# Patient Record
Sex: Female | Born: 1945 | Race: Black or African American | Hispanic: No | Smoking: Former smoker
Health system: Southern US, Community
[De-identification: ages and names within clinical notes are randomized; demographics above are authoritative.]

## PROBLEM LIST (undated history)

## (undated) ENCOUNTER — Emergency Department (HOSPITAL_COMMUNITY): Admission: EM | Discharge status: Absent without leave | Payer: Medicare HMO

## (undated) DIAGNOSIS — C50919 Malignant neoplasm of unspecified site of unspecified female breast: Secondary | ICD-10-CM

## (undated) DIAGNOSIS — E785 Hyperlipidemia, unspecified: Secondary | ICD-10-CM

## (undated) DIAGNOSIS — I10 Essential (primary) hypertension: Secondary | ICD-10-CM

## (undated) DIAGNOSIS — M1990 Unspecified osteoarthritis, unspecified site: Secondary | ICD-10-CM

## (undated) DIAGNOSIS — T7840XA Allergy, unspecified, initial encounter: Secondary | ICD-10-CM

## (undated) DIAGNOSIS — J45909 Unspecified asthma, uncomplicated: Secondary | ICD-10-CM

## (undated) DIAGNOSIS — I714 Abdominal aortic aneurysm, without rupture, unspecified: Secondary | ICD-10-CM

## (undated) DIAGNOSIS — H269 Unspecified cataract: Secondary | ICD-10-CM

## (undated) DIAGNOSIS — G43909 Migraine, unspecified, not intractable, without status migrainosus: Secondary | ICD-10-CM

## (undated) DIAGNOSIS — C801 Malignant (primary) neoplasm, unspecified: Secondary | ICD-10-CM

## (undated) DIAGNOSIS — R011 Cardiac murmur, unspecified: Secondary | ICD-10-CM

## (undated) DIAGNOSIS — K219 Gastro-esophageal reflux disease without esophagitis: Secondary | ICD-10-CM

## (undated) HISTORY — DX: Abdominal aortic aneurysm, without rupture: I71.4

## (undated) HISTORY — PX: DILATION AND CURETTAGE OF UTERUS: SHX78

## (undated) HISTORY — PX: MASTECTOMY: SHX3

## (undated) HISTORY — DX: Allergy, unspecified, initial encounter: T78.40XA

## (undated) HISTORY — PX: POLYPECTOMY: SHX149

## (undated) HISTORY — DX: Abdominal aortic aneurysm, without rupture, unspecified: I71.40

## (undated) HISTORY — DX: Migraine, unspecified, not intractable, without status migrainosus: G43.909

## (undated) HISTORY — DX: Gastro-esophageal reflux disease without esophagitis: K21.9

## (undated) HISTORY — PX: BREAST SURGERY: SHX581

## (undated) HISTORY — DX: Unspecified osteoarthritis, unspecified site: M19.90

## (undated) HISTORY — PX: BREAST LUMPECTOMY: SHX2

## (undated) HISTORY — PX: OTHER SURGICAL HISTORY: SHX169

## (undated) HISTORY — PX: COLONOSCOPY: SHX174

## (undated) HISTORY — DX: Unspecified cataract: H26.9

## (undated) HISTORY — PX: TONSILLECTOMY: SUR1361

## (undated) HISTORY — DX: Hyperlipidemia, unspecified: E78.5

## (undated) HISTORY — DX: Malignant (primary) neoplasm, unspecified: C80.1

## (undated) HISTORY — DX: Malignant neoplasm of unspecified site of unspecified female breast: C50.919

## (undated) HISTORY — PX: TUBAL LIGATION: SHX77

## (undated) HISTORY — DX: Cardiac murmur, unspecified: R01.1

---

## 1976-02-16 DIAGNOSIS — I10 Essential (primary) hypertension: Secondary | ICD-10-CM

## 1976-02-16 HISTORY — DX: Essential (primary) hypertension: I10

## 1983-02-16 HISTORY — PX: CHOLECYSTECTOMY: SHX55

## 1987-02-16 HISTORY — PX: ABDOMINAL HYSTERECTOMY: SHX81

## 2001-02-15 DIAGNOSIS — K219 Gastro-esophageal reflux disease without esophagitis: Secondary | ICD-10-CM

## 2001-02-15 DIAGNOSIS — J45909 Unspecified asthma, uncomplicated: Secondary | ICD-10-CM

## 2001-02-15 HISTORY — DX: Gastro-esophageal reflux disease without esophagitis: K21.9

## 2001-02-15 HISTORY — DX: Unspecified asthma, uncomplicated: J45.909

## 2004-02-16 DIAGNOSIS — C801 Malignant (primary) neoplasm, unspecified: Secondary | ICD-10-CM

## 2004-02-16 HISTORY — DX: Malignant (primary) neoplasm, unspecified: C80.1

## 2005-02-15 HISTORY — PX: ROTATOR CUFF REPAIR: SHX139

## 2006-02-15 DIAGNOSIS — G43909 Migraine, unspecified, not intractable, without status migrainosus: Secondary | ICD-10-CM

## 2006-02-15 HISTORY — DX: Migraine, unspecified, not intractable, without status migrainosus: G43.909

## 2012-06-16 ENCOUNTER — Emergency Department (HOSPITAL_COMMUNITY)
Admission: EM | Admit: 2012-06-16 | Discharge: 2012-06-16 | Disposition: A | Discharge status: Home / Self Care | Payer: Federal, State, Local not specified - PPO | Source: Home / Self Care | Attending: Family Medicine | Admitting: Family Medicine

## 2012-06-16 ENCOUNTER — Encounter (HOSPITAL_COMMUNITY): Payer: Self-pay | Admitting: Emergency Medicine

## 2012-06-16 ENCOUNTER — Emergency Department (INDEPENDENT_AMBULATORY_CARE_PROVIDER_SITE_OTHER): Payer: Federal, State, Local not specified - PPO

## 2012-06-16 DIAGNOSIS — K219 Gastro-esophageal reflux disease without esophagitis: Secondary | ICD-10-CM

## 2012-06-16 DIAGNOSIS — J302 Other seasonal allergic rhinitis: Secondary | ICD-10-CM

## 2012-06-16 DIAGNOSIS — IMO0001 Reserved for inherently not codable concepts without codable children: Secondary | ICD-10-CM

## 2012-06-16 DIAGNOSIS — J309 Allergic rhinitis, unspecified: Secondary | ICD-10-CM

## 2012-06-16 HISTORY — DX: Essential (primary) hypertension: I10

## 2012-06-16 HISTORY — DX: Unspecified asthma, uncomplicated: J45.909

## 2012-06-16 MED ORDER — OMEPRAZOLE 20 MG PO CPDR: 20.0000 mg | DELAYED_RELEASE_CAPSULE | Freq: Every day | ORAL | Status: DC

## 2012-06-16 MED ORDER — MOMETASONE FUROATE 50 MCG/ACT NA SUSP: 2.0000 | Freq: Two times a day (BID) | NASAL | Status: DC

## 2012-06-16 MED ORDER — HYDROCOD POLST-CHLORPHEN POLST 10-8 MG/5ML PO LQCR: 5.0000 mL | Freq: Every evening | ORAL | Status: DC | PRN

## 2012-06-16 NOTE — ED Provider Notes (Signed)
History     CSN: [id number]  Arrival date & time 06/16/12  1240   First MD Initiated Contact with Patient 06/16/12 1322      Chief Complaint  Patient presents with  . Sore Throat     Patient is a 67 y.o. female presenting with pharyngitis. The history is provided by the patient.  Sore Throat This is a new problem. The current episode started more than 2 days ago. The problem occurs constantly. The problem has not changed since onset.Pertinent negatives include no chest pain, no abdominal pain and no shortness of breath. The symptoms are aggravated by coughing. Nothing relieves the symptoms.  Pt is a somewhat vague, inarticulate historian that reports 4-5 days of severe throat irritation. States it feels like her throat has something stuck in it. Approx 7 days ago pt noted increased PND that she would feel running down her throat at night that often caused her to cough. The cough is a very dry cough and often makes her gag. She states since onset of symptoms she has had increased sensation that something is stuck in her throat. Coughing is worse when lying down and after eating. Denies other URI type symptoms though has had a persistent low grade fever (T-Max 99). States the cough is often so bad at night that she can't sleep.   Past Medical History  Diagnosis Date  . Hypertension   . Asthma     Past Surgical History  Procedure Laterality Date  . Abdominal hysterectomy    . Cholecystectomy    . Breast surgery    . Rotator cuff repair      No family history on file.  History  Substance Use Topics  . Smoking status: Never Smoker   . Smokeless tobacco: Not on file  . Alcohol Use: No    OB History   Grav Para Term Preterm Abortions TAB SAB Ect Mult Living                  Review of Systems  Constitutional: Negative for fever and chills.  HENT: Positive for sore throat and postnasal drip. Negative for ear pain, congestion, facial swelling, rhinorrhea, sneezing and trouble  swallowing.   Eyes: Negative.   Respiratory: Positive for cough. Negative for chest tightness, shortness of breath and wheezing.   Cardiovascular: Negative for chest pain.  Gastrointestinal: Negative for nausea, vomiting, abdominal pain and diarrhea.  Endocrine: Negative.   Genitourinary: Negative.   Musculoskeletal: Negative.   Skin: Negative.   Allergic/Immunologic: Negative.   Neurological: Negative.   Hematological: Negative.   Psychiatric/Behavioral: Negative.     Allergies  Ivp dye and Sulfa antibiotics  Home Medications   Current Outpatient Rx  Name  Route  Sig  Dispense  Refill  . LISINOPRIL PO   Oral   Take by mouth.         Marland Kitchen POTASSIUM PO   Oral   Take by mouth.           BP 125/80  Pulse 72  Temp(Src) 99.6 F (37.6 C) (Oral)  Resp 20  SpO2 98%  Physical Exam  Constitutional: She is oriented to person, place, and time. She appears well-developed and well-nourished.  HENT:  Head: Normocephalic and atraumatic.  Right Ear: Tympanic membrane, external ear and ear canal normal.  Left Ear: Tympanic membrane, external ear and ear canal normal.  Nose: Nose normal. Right sinus exhibits no maxillary sinus tenderness and no frontal sinus tenderness. Left sinus exhibits no  maxillary sinus tenderness and no frontal sinus tenderness.  Mouth/Throat: Uvula is midline, oropharynx is clear and moist and mucous membranes are normal.  Cobblestoning to posterior oropharynx  Eyes: Conjunctivae are normal.  Neck: Neck supple.  Cardiovascular: Normal rate and regular rhythm.   Pulmonary/Chest: Effort normal and breath sounds normal.  Musculoskeletal: Normal range of motion.  Lymphadenopathy:    She has no cervical adenopathy.  Neurological: She is alert and oriented to person, place, and time.  Skin: Skin is warm and dry.  Psychiatric: She has a normal mood and affect.    ED Course  Procedures (including critical care time)  Labs Reviewed  POCT RAPID STREP A ([facility]  URG CARE ONLY)   No results found.   No diagnosis found.    MDM  4-5 day h/o something stuck in her throat. Persistent dry cough that is worse when lying down and after eating. Soft tissue neck imaging normal. Suspect possible combination of refux symptoms possibly exacerbated by seasonal allergy symptoms. Will         Leanne Chang, NP 06/16/12 [phone]

## 2012-06-16 NOTE — ED Provider Notes (Signed)
Medical screening examination/treatment/procedure(s) were performed by non-physician practitioner and as supervising physician I was immediately available for consultation/collaboration.   MORENO-COLL,Mieka Leaton; MD  Diezel Mazur Moreno-Coll, MD 06/16/12 1515 

## 2012-06-16 NOTE — ED Notes (Signed)
Pt c/o sore throat onset 1 week Sx also include: dry cough and feeling nauseas, odynophagia  Pain in throat feels like she's "choking and it's closing up on her" Taking OTC cough meds w/little relief Denies: f/v/d  She is alert and oriented w/no signs of acute distress.

## 2012-07-29 ENCOUNTER — Emergency Department (INDEPENDENT_AMBULATORY_CARE_PROVIDER_SITE_OTHER)
Admission: EM | Admit: 2012-07-29 | Discharge: 2012-07-29 | Disposition: A | Discharge status: Home / Self Care | Payer: Federal, State, Local not specified - PPO | Source: Home / Self Care | Attending: Emergency Medicine | Admitting: Emergency Medicine

## 2012-07-29 ENCOUNTER — Encounter (HOSPITAL_COMMUNITY): Payer: Self-pay | Admitting: *Deleted

## 2012-07-29 ENCOUNTER — Emergency Department (INDEPENDENT_AMBULATORY_CARE_PROVIDER_SITE_OTHER): Payer: Federal, State, Local not specified - PPO

## 2012-07-29 DIAGNOSIS — S5290XA Unspecified fracture of unspecified forearm, initial encounter for closed fracture: Secondary | ICD-10-CM

## 2012-07-29 DIAGNOSIS — S5292XA Unspecified fracture of left forearm, initial encounter for closed fracture: Secondary | ICD-10-CM

## 2012-07-29 MED ORDER — ONDANSETRON HCL 4 MG/2ML IJ SOLN
INTRAMUSCULAR | Status: AC
  Filled 2012-07-29: qty 2

## 2012-07-29 MED ORDER — HYDROMORPHONE HCL PF 1 MG/ML IJ SOLN
INTRAMUSCULAR | Status: AC
  Filled 2012-07-29: qty 1

## 2012-07-29 MED ORDER — HYDROMORPHONE HCL PF 1 MG/ML IJ SOLN
1.0000 mg | Freq: Once | INTRAMUSCULAR | Status: AC
  Administered 2012-07-29: 1 mg via INTRAMUSCULAR

## 2012-07-29 MED ORDER — ONDANSETRON HCL 4 MG/2ML IJ SOLN
4.0000 mg | Freq: Once | INTRAMUSCULAR | Status: AC
  Administered 2012-07-29: 4 mg via INTRAMUSCULAR

## 2012-07-29 MED ORDER — OXYCODONE-ACETAMINOPHEN 5-325 MG PO TABS: ORAL_TABLET | ORAL | Status: DC

## 2012-07-29 MED ORDER — ONDANSETRON 8 MG PO TBDP: 8.0000 mg | ORAL_TABLET | Freq: Three times a day (TID) | ORAL | Status: DC | PRN

## 2012-07-29 NOTE — ED Provider Notes (Signed)
Chief Complaint:   Chief Complaint  Patient presents with  . Fall     History of Present Illness:   Kathryn [name] is a 67 year old female who fell today on her outstretched left hand, injuring her left wrist. The wrist is swollen and painful. It hurts to move it. She is able to move her fingers and denies any numbness or tingling. She has mild pain in her elbow and shoulder but is able to move well. She denies any head injury or loss of consciousness.  Review of Systems:  Other than noted above, the patient denies any of the following symptoms: Systemic:  No fevers, chills, sweats, or aches.  No fatigue or tiredness. Musculoskeletal:  No joint pain, arthritis, bursitis, swelling, back pain, or neck pain. Neurological:  No muscular weakness, paresthesias, headache, or trouble with speech or coordination.  No dizziness.  PMFSH:  Past medical history, family history, social history, meds, and allergies were reviewed.  She is allergic to contrast dye and sulfa. She takes lisinopril, Nasonex, Prilosec, and potassium. She has hypertension, diet-controlled diabetes, arthritis, COPD, and history of breast cancer.  Physical Exam:   Vital signs:  BP 168/116  Pulse 87  Temp(Src) 99.1 F (37.3 C) (Oral)  Resp 18  SpO2 100% Gen:  Alert and oriented times 3.  In no distress. Musculoskeletal: There was swelling and pain to palpation over the the distal radius. The wrist has a decreased range of motion with pain. She's able to move all her digits well and sensation is intact. Radial pulse was full..  Otherwise, all joints had a full a ROM with no swelling, bruising or deformity.  No edema, pulses full. Extremities were warm and pink.  Capillary refill was brisk.  Skin:  Clear, warm and dry.  No rash. Neuro:  Alert and oriented times 3.  Muscle strength was normal.  Sensation was intact to light touch.   Radiology:  Dg Wrist Complete Left  07/29/2012   *RADIOLOGY REPORT*  Clinical Data: Fall, left wrist  pain  LEFT WRIST - COMPLETE 3+ VIEW  Comparison: None.  Findings: Two views of the left wrist submitted.  Narrowing of radiocarpal joint space.  Degenerative changes first carpal metacarpal joint.  There is a lucent line in distal left radius highly suspicious for nondisplaced fracture.  IMPRESSION: Probable nondisplaced fracture in distal left radius.  Narrowing of radiocarpal joint space.   Original Report Authenticated By: Natasha Mead, M.D.     I reviewed the images independently and personally and concur with the radiologist's findings.  Course in Urgent Care Center:   She was in a good yield pain upon arrival and so was given Dilaudid 1 mg IM and Zofran 4 mg IM. A sugar tong splint was applied and she was given a sling.  Assessment:  The encounter diagnosis was Radius fracture, left, closed, initial encounter.  This is nondisplaced, should heal up well.  Plan:   1.  The following meds were prescribed:   Discharge Medication List as of 07/29/2012  4:23 PM    START taking these medications   Details  ondansetron (ZOFRAN ODT) 8 MG disintegrating tablet Take 1 tablet (8 mg total) by mouth every 8 (eight) hours as needed for nausea., Starting 07/29/2012, Until Discontinued, Normal    oxyCODONE-acetaminophen (PERCOCET) 5-325 MG per tablet 1 to 2 tablets every 6 hours as needed for pain., Print       2.  The patient was instructed in symptomatic care, including rest and activity,  elevation, application of ice and compression.  Appropriate handouts were given. 3.  The patient was told to return if becoming worse in any way, if no better in 3 or 4 days, and given some red flag symptoms such as worsening pain or new neurological symptoms that would indicate earlier return.   4.  The patient was told to follow up with Dr. Bradly Bienenstock next week.    Reuben Likes, MD 07/29/12 [phone]

## 2012-07-29 NOTE — ED Notes (Signed)
Pt  Reports  She  Felled  Today    Injured  Her     l  Wrist  And  l  Shoulder   She     Did  Not  Black  Out         The  Wrist  Is  Deformed                No  Sticks     BP  r  Arm  S/p  Mastectomy

## 2012-07-29 NOTE — Progress Notes (Signed)
Orthopedic Tech Progress Note Patient Details:  Kathryn [name] [birth year] [id number]  Ortho Devices Type of Ortho Device: Ace wrap;Arm sling;Sugartong splint Ortho Device/Splint Location: LUE Ortho Device/Splint Interventions: Ordered;Application   Jennye Moccasin 07/29/2012, 4:38 PM

## 2012-11-24 ENCOUNTER — Emergency Department (HOSPITAL_COMMUNITY): Payer: Medicare Other

## 2012-11-24 ENCOUNTER — Inpatient Hospital Stay (HOSPITAL_COMMUNITY)
Admission: EM | Admit: 2012-11-24 | Discharge: 2012-11-27 | DRG: 808 | Disposition: A | Discharge status: Home / Self Care | Payer: Medicare Other | Attending: Internal Medicine | Admitting: Internal Medicine

## 2012-11-24 ENCOUNTER — Encounter (HOSPITAL_COMMUNITY): Payer: Self-pay | Admitting: Emergency Medicine

## 2012-11-24 DIAGNOSIS — J449 Chronic obstructive pulmonary disease, unspecified: Secondary | ICD-10-CM | POA: Diagnosis present

## 2012-11-24 DIAGNOSIS — Z87891 Personal history of nicotine dependence: Secondary | ICD-10-CM

## 2012-11-24 DIAGNOSIS — D709 Neutropenia, unspecified: Secondary | ICD-10-CM | POA: Diagnosis present

## 2012-11-24 DIAGNOSIS — R5081 Fever presenting with conditions classified elsewhere: Secondary | ICD-10-CM | POA: Diagnosis present

## 2012-11-24 DIAGNOSIS — F32A Depression, unspecified: Secondary | ICD-10-CM | POA: Diagnosis present

## 2012-11-24 DIAGNOSIS — J3489 Other specified disorders of nose and nasal sinuses: Secondary | ICD-10-CM | POA: Diagnosis present

## 2012-11-24 DIAGNOSIS — B9789 Other viral agents as the cause of diseases classified elsewhere: Secondary | ICD-10-CM | POA: Diagnosis present

## 2012-11-24 DIAGNOSIS — K5900 Constipation, unspecified: Secondary | ICD-10-CM

## 2012-11-24 DIAGNOSIS — E876 Hypokalemia: Secondary | ICD-10-CM | POA: Diagnosis present

## 2012-11-24 DIAGNOSIS — F3289 Other specified depressive episodes: Secondary | ICD-10-CM | POA: Diagnosis present

## 2012-11-24 DIAGNOSIS — D1779 Benign lipomatous neoplasm of other sites: Secondary | ICD-10-CM | POA: Diagnosis present

## 2012-11-24 DIAGNOSIS — I714 Abdominal aortic aneurysm, without rupture, unspecified: Secondary | ICD-10-CM | POA: Diagnosis present

## 2012-11-24 DIAGNOSIS — C50919 Malignant neoplasm of unspecified site of unspecified female breast: Secondary | ICD-10-CM | POA: Diagnosis present

## 2012-11-24 DIAGNOSIS — I10 Essential (primary) hypertension: Secondary | ICD-10-CM

## 2012-11-24 DIAGNOSIS — Z853 Personal history of malignant neoplasm of breast: Secondary | ICD-10-CM | POA: Insufficient documentation

## 2012-11-24 DIAGNOSIS — F329 Major depressive disorder, single episode, unspecified: Secondary | ICD-10-CM | POA: Diagnosis present

## 2012-11-24 DIAGNOSIS — J4489 Other specified chronic obstructive pulmonary disease: Secondary | ICD-10-CM | POA: Diagnosis present

## 2012-11-24 DIAGNOSIS — J45909 Unspecified asthma, uncomplicated: Secondary | ICD-10-CM

## 2012-11-24 DIAGNOSIS — E43 Unspecified severe protein-calorie malnutrition: Secondary | ICD-10-CM | POA: Diagnosis present

## 2012-11-24 DIAGNOSIS — J069 Acute upper respiratory infection, unspecified: Secondary | ICD-10-CM | POA: Diagnosis present

## 2012-11-24 LAB — CBC WITH DIFFERENTIAL/PLATELET
Basophils Relative: 1 % (ref 0–1)
Eosinophils Absolute: 0 10*3/uL (ref 0.0–0.7)
HCT: 28 % — ABNORMAL LOW (ref 36.0–46.0)
Hemoglobin: 9.5 g/dL — ABNORMAL LOW (ref 12.0–15.0)
Lymphs Abs: 1.1 10*3/uL (ref 0.7–4.0)
MCH: 26.3 pg (ref 26.0–34.0)
MCHC: 33.9 g/dL (ref 30.0–36.0)
MCV: 77.6 fL — ABNORMAL LOW (ref 78.0–100.0)
Monocytes Absolute: 0.5 10*3/uL (ref 0.1–1.0)
Neutro Abs: 0.1 10*3/uL — ABNORMAL LOW (ref 1.7–7.7)

## 2012-11-24 LAB — COMPREHENSIVE METABOLIC PANEL
Albumin: 2.8 g/dL — ABNORMAL LOW (ref 3.5–5.2)
BUN: 7 mg/dL (ref 6–23)
Chloride: 104 mEq/L (ref 96–112)
Creatinine, Ser: 0.83 mg/dL (ref 0.50–1.10)
Total Bilirubin: 0.3 mg/dL (ref 0.3–1.2)
Total Protein: 5.9 g/dL — ABNORMAL LOW (ref 6.0–8.3)

## 2012-11-24 LAB — INFLUENZA PANEL BY PCR (TYPE A & B)
H1N1 flu by pcr: NOT DETECTED
Influenza A By PCR: NEGATIVE
Influenza B By PCR: NEGATIVE

## 2012-11-24 LAB — URINALYSIS, ROUTINE W REFLEX MICROSCOPIC
Glucose, UA: NEGATIVE mg/dL
Leukocytes, UA: NEGATIVE
Protein, ur: NEGATIVE mg/dL
Urobilinogen, UA: 0.2 mg/dL (ref 0.0–1.0)

## 2012-11-24 MED ORDER — POTASSIUM CHLORIDE CRYS ER 20 MEQ PO TBCR
40.0000 meq | EXTENDED_RELEASE_TABLET | Freq: Once | ORAL | Status: AC
  Administered 2012-11-24: 40 meq via ORAL
  Filled 2012-11-24: qty 2

## 2012-11-24 MED ORDER — POLYETHYLENE GLYCOL 3350 17 G PO PACK
17.0000 g | PACK | Freq: Every day | ORAL | Status: DC
  Administered 2012-11-24 – 2012-11-27 (×4): 17 g via ORAL
  Filled 2012-11-24 (×5): qty 1

## 2012-11-24 MED ORDER — ACETAMINOPHEN 325 MG PO TABS
650.0000 mg | ORAL_TABLET | Freq: Four times a day (QID) | ORAL | Status: DC | PRN
  Administered 2012-11-25 – 2012-11-26 (×4): 650 mg via ORAL
  Filled 2012-11-24 (×4): qty 2

## 2012-11-24 MED ORDER — POTASSIUM CHLORIDE CRYS ER 20 MEQ PO TBCR: 30.0000 meq | EXTENDED_RELEASE_TABLET | Freq: Once | ORAL | Status: DC

## 2012-11-24 MED ORDER — SODIUM CHLORIDE 0.9 % IJ SOLN: 3.0000 mL | INTRAMUSCULAR | Status: DC | PRN

## 2012-11-24 MED ORDER — ENOXAPARIN SODIUM 40 MG/0.4ML ~~LOC~~ SOLN
40.0000 mg | SUBCUTANEOUS | Status: DC
  Administered 2012-11-24 – 2012-11-26 (×3): 40 mg via SUBCUTANEOUS
  Filled 2012-11-24 (×4): qty 0.4

## 2012-11-24 MED ORDER — SODIUM CHLORIDE 0.9 % IV SOLN: 250.0000 mL | INTRAVENOUS | Status: DC | PRN

## 2012-11-24 MED ORDER — DOCUSATE SODIUM 100 MG PO CAPS
100.0000 mg | ORAL_CAPSULE | Freq: Every day | ORAL | Status: DC | PRN
  Administered 2012-11-24: 100 mg via ORAL
  Filled 2012-11-24: qty 1

## 2012-11-24 MED ORDER — DEXTROSE 5 % IV SOLN
2.0000 g | Freq: Three times a day (TID) | INTRAVENOUS | Status: DC
  Administered 2012-11-24 – 2012-11-27 (×9): 2 g via INTRAVENOUS
  Filled 2012-11-24 (×12): qty 2

## 2012-11-24 MED ORDER — ENSURE COMPLETE PO LIQD
237.0000 mL | ORAL | Status: DC
  Administered 2012-11-25 – 2012-11-26 (×2): 237 mL via ORAL

## 2012-11-24 MED ORDER — SODIUM CHLORIDE 0.9 % IJ SOLN
3.0000 mL | Freq: Two times a day (BID) | INTRAMUSCULAR | Status: DC
  Administered 2012-11-24 – 2012-11-27 (×2): 3 mL via INTRAVENOUS

## 2012-11-24 MED ORDER — ALBUTEROL SULFATE HFA 108 (90 BASE) MCG/ACT IN AERS
2.0000 | INHALATION_SPRAY | Freq: Four times a day (QID) | RESPIRATORY_TRACT | Status: DC | PRN
  Filled 2012-11-24: qty 6.7

## 2012-11-24 MED ORDER — ACETAMINOPHEN 650 MG RE SUPP: 650.0000 mg | Freq: Four times a day (QID) | RECTAL | Status: DC | PRN

## 2012-11-24 NOTE — ED Provider Notes (Signed)
CSN: [id number]     Arrival date & time 11/24/12  4098 History   First MD Initiated Contact with Patient 11/24/12 [phone]     Chief Complaint  Patient presents with  . Fever   (Consider location/radiation/quality/duration/timing/severity/associated sxs/prior Treatment) HPI Comments: 67 yo AA female with h/o Breast CA undergoing chemotherapy presents with fever to 100.1 at home.  She is being treated at [facility].  She is unsure of her chemotherapy agents, but gets weekly IV treatment every Wednesday which started on 10SEP14.  She is scheduled treatment for 1 year.    Pt denies sick contacts, recent travel, or recent infections.    She does have URI congestion and mild cough.  She denies HA, rash, abd pain, n/v/d, f/u/d, CP, SOB, neck pain.    Patient is a 67 y.o. female presenting with fever. The history is provided by the patient.  Fever Max temp prior to arrival:  100.1 @ 0900 Temp source:  Oral Severity:  Mild Onset quality:  Sudden Timing:  Intermittent Progression:  Waxing and waning Chronicity:  New Relieved by:  None tried Worsened by:  Nothing tried Ineffective treatments:  None tried Associated symptoms: congestion and cough   Associated symptoms: no chest pain, no chills, no diarrhea, no dysuria, no ear pain, no headaches, no myalgias, no nausea and no rash   Congestion:    Location:  Nasal   Interferes with sleep: no     Interferes with eating/drinking: no   Cough:    Cough characteristics:  Non-productive (no hemoptysis)   Severity:  Mild   Timing:  Intermittent   Progression:  Waxing and waning   Chronicity:  New Risk factors: hx of cancer and immunosuppression   Risk factors: no occupational exposure, no recent surgery, no recent travel and no sick contacts   Risk factors comment:  Diagnosed with Breast cancer followed at [facility]; Mastectomy on R 5 yrs ago and lumpectomy on L May 2014; XRT on L 5 yrs ago; Now receiving chemotherapy for R  Breast  cancer - onset 10SEP14, weekly treatment, but no tx this WED due to cold    Past Medical History  Diagnosis Date  . Hypertension   . Asthma    Past Surgical History  Procedure Laterality Date  . Abdominal hysterectomy    . Cholecystectomy    . Breast surgery    . Rotator cuff repair     History reviewed. No pertinent family history. History  Substance Use Topics  . Smoking status: Former Smoker    Quit date: 06/04/2012  . Smokeless tobacco: Never Used  . Alcohol Use: No   OB History   Grav Para Term Preterm Abortions TAB SAB Ect Mult Living                 Review of Systems  Constitutional: Positive for fever. Negative for chills.  HENT: Positive for congestion. Negative for ear pain.   Eyes: Negative.   Respiratory: Positive for cough. Negative for choking, chest tightness, shortness of breath, wheezing and stridor.   Cardiovascular: Negative for chest pain and leg swelling.  Gastrointestinal: Negative.  Negative for nausea and diarrhea.  Endocrine: Negative.   Genitourinary: Negative for dysuria, urgency, frequency, hematuria, flank pain, decreased urine volume, vaginal bleeding, vaginal discharge, enuresis, difficulty urinating, genital sores, vaginal pain, menstrual problem, pelvic pain and dyspareunia.       Pt had a TAH  Musculoskeletal: Negative for myalgias.  Skin: Negative for rash.  Allergic/Immunologic: Positive for immunocompromised state.  Neurological: Negative for headaches.    Allergies  Ivp dye; Milk-related compounds; and Sulfa antibiotics  Home Medications   No current outpatient prescriptions on file. BP 136/73  Pulse 91  Temp(Src) 100.1 F (37.8 C) (Oral)  Resp 18  Ht 5\' 6"  (1.676 m)  Wt 164 lb (74.39 kg)  BMI 26.48 kg/m2  SpO2 98% Physical Exam  Constitutional: She is oriented to person, place, and time. She appears well-developed and well-nourished. No distress.  HENT:  Head: Normocephalic and atraumatic.  Right Ear: External ear  normal.  Left Ear: External ear normal.  Nose: Nose normal.  Mouth/Throat: Oropharynx is clear and moist. No oropharyngeal exudate.  Eyes: Conjunctivae are normal. Pupils are equal, round, and reactive to light. Right eye exhibits no discharge. Left eye exhibits no discharge.  Neck: Normal range of motion. Neck supple. No JVD present.  No meningismus  Cardiovascular: Normal rate.  Exam reveals no gallop and no friction rub.   No murmur heard. Pulmonary/Chest: Effort normal and breath sounds normal. No stridor. No respiratory distress. She has no wheezes. She has no rales. She exhibits no tenderness.  Abdominal: Soft. Bowel sounds are normal. She exhibits no distension and no mass. There is no tenderness. There is no rebound and no guarding.  Musculoskeletal: Normal range of motion. She exhibits no edema and no tenderness.  Neurological: She is alert and oriented to person, place, and time. She has normal reflexes.  Skin: Skin is warm and dry. No rash noted. She is not diaphoretic. No erythema.    ED Course  Procedures (including critical care time) Labs Review Imaging Review Dg Chest 2 View  11/24/2012   CLINICAL DATA:  Fever.  EXAM: CHEST  2 VIEW  COMPARISON:  None.  FINDINGS: Cardiac silhouette is normal. Tortuosity of the thoracic aorta is noted. No focal infiltrate or edema. No pleural effusion or pneumothorax. No acute osseous abnormality.  IMPRESSION: No active cardiopulmonary disease.   Electronically Signed   By: Jerene Dilling M.D.   On: 11/24/2012 10:42   Results for orders placed during the hospital encounter of 11/24/12  CBC WITH DIFFERENTIAL      Result Value Range   WBC 1.7 (*) 4.0 - 10.5 K/uL   RBC 3.61 (*) 3.87 - 5.11 MIL/uL   Hemoglobin 9.5 (*) 12.0 - 15.0 g/dL   HCT 62.1 (*) 30.8 - 65.7 %   MCV 77.6 (*) 78.0 - 100.0 fL   MCH 26.3  26.0 - 34.0 pg   MCHC 33.9  30.0 - 36.0 g/dL   RDW 84.6  96.2 - 95.2 %   Platelets 104 (*) 150 - 400 K/uL   Neutrophils Relative  % 6 (*) 43 - 77 %   Lymphocytes Relative 60 (*) 12 - 46 %   Monocytes Relative 32 (*) 3 - 12 %   Eosinophils Relative 1  0 - 5 %   Basophils Relative 1  0 - 1 %   Neutro Abs 0.1 (*) 1.7 - 7.7 K/uL   Lymphs Abs 1.1  0.7 - 4.0 K/uL   Monocytes Absolute 0.5  0.1 - 1.0 K/uL   Eosinophils Absolute 0.0  0.0 - 0.7 K/uL   Basophils Absolute 0.0  0.0 - 0.1 K/uL  COMPREHENSIVE METABOLIC PANEL      Result Value Range   Sodium 141  135 - 145 mEq/L   Potassium 3.1 (*) 3.5 - 5.1 mEq/L   Chloride 104  96 - 112  mEq/L   CO2 27  19 - 32 mEq/L   Glucose, Bld 95  70 - 99 mg/dL   BUN 7  6 - 23 mg/dL   Creatinine, Ser 1.61  0.50 - 1.10 mg/dL   Calcium 8.1 (*) 8.4 - 10.5 mg/dL   Total Protein 5.9 (*) 6.0 - 8.3 g/dL   Albumin 2.8 (*) 3.5 - 5.2 g/dL   AST 11  0 - 37 U/L   ALT 14  0 - 35 U/L   Alkaline Phosphatase 46  39 - 117 U/L   Total Bilirubin 0.3  0.3 - 1.2 mg/dL   GFR calc non Af Amer 71 (*) >90 mL/min   GFR calc Af Amer 82 (*) >90 mL/min  URINALYSIS, ROUTINE W REFLEX MICROSCOPIC      Result Value Range   Color, Urine YELLOW  YELLOW   APPearance CLEAR  CLEAR   Specific Gravity, Urine 1.007  1.005 - 1.030   pH 6.5  5.0 - 8.0   Glucose, UA NEGATIVE  NEGATIVE mg/dL   Hgb urine dipstick NEGATIVE  NEGATIVE   Bilirubin Urine NEGATIVE  NEGATIVE   Ketones, ur NEGATIVE  NEGATIVE mg/dL   Protein, ur NEGATIVE  NEGATIVE mg/dL   Urobilinogen, UA 0.2  0.0 - 1.0 mg/dL   Nitrite NEGATIVE  NEGATIVE   Leukocytes, UA NEGATIVE  NEGATIVE  INFLUENZA PANEL BY PCR      Result Value Range   Influenza A By PCR NEGATIVE  NEGATIVE   Influenza B By PCR NEGATIVE  NEGATIVE   H1N1 flu by pcr NOT DETECTED  NOT DETECTED   BC x 2 pending UC pending  CRITICAL CARE Performed by: Redgie Grayer, DAVID   Total critical care time: 30  Critical care time was exclusive of separately billable procedures and treating other patients.  Critical care was necessary to treat or prevent imminent or life-threatening  deterioration.  Critical care was time spent personally by me on the following activities: development of treatment plan with patient and/or surrogate as well as nursing, discussions with consultants, evaluation of patient's response to treatment, examination of patient, obtaining history from patient or surrogate, ordering and performing treatments and interventions, ordering and review of laboratory studies, ordering and review of radiographic studies, pulse oximetry and re-evaluation of patient's condition.   MDM   1. Neutropenic fever   2. Hypertension   3. Asthma    67 year old AA  female presents emergency department with chief complaint of fever. Patient's oral temperature at home was 100.1 at 9:00 AM today. Physical exam is benign. ER workup is significant for a neutropenia with an ANC of approximately 100. Chest x-ray and urine are negative. Potassium is low at 3.1. Patient's temperature in the ER is 99.3. I will consult internal medicine for evaluation and discuss antibiotics with them. Blood cultures and urine cultures were obtained.  1133 VSS.  Pt in nad and well appearing. Consult Int Med.  Administer Cefepime.    Case discussed with internal medicine plan for admission as an inpatient. Unclear source of infection at this time however with significant neutropenia we will provide a dose of cefepime per pharmacy consult. Admit for continued IV antibiotics and monitoring of cultures. Urine culture and blood cultures are pending at this time. Patient is in no acute distress and she is aware of the plan.    Darlys Gales, MD 11/24/12 2231

## 2012-11-24 NOTE — Progress Notes (Signed)
UR completed.  Patrcia Schnepp, RN BSN MHA CCM Trauma/Neuro ICU Case Manager [phone]  

## 2012-11-24 NOTE — ED Notes (Signed)
Patient at Xray.

## 2012-11-24 NOTE — ED Notes (Signed)
Admitting MD at the bedside. Pt to be transported afterwards. Floor RN aware

## 2012-11-24 NOTE — Progress Notes (Signed)
ANTIBIOTIC CONSULT NOTE - INITIAL  Pharmacy Consult for cefepime Indication: Unknown; febrile neutropenia  Allergies  Allergen Reactions  . Ivp Dye [Iodinated Diagnostic Agents]   . Milk-Related Compounds Other (See Comments)    Stomach pains, gas, bloating  . Sulfa Antibiotics Hives    Patient Measurements: Height: 5\' 6"  (167.6 cm) Weight: 164 lb (74.39 kg) IBW/kg (Calculated) : 59.3 Adjusted Body Weight:   Vital Signs: Temp: 99.3 F (37.4 C) (10/10 0939) Temp src: Oral (10/10 0939) BP: 123/66 mmHg (10/10 1115) Pulse Rate: 87 (10/10 1115) Intake/Output from previous day:   Intake/Output from this shift:    Labs:  Recent Labs  11/24/12 1015  WBC 1.7*  HGB 9.5*  PLT 104*  CREATININE 0.83   Estimated Creatinine Clearance: 66.9 ml/min (by C-G formula based on Cr of 0.83). No results found for this basename: VANCOTROUGH, VANCOPEAK, VANCORANDOM, GENTTROUGH, GENTPEAK, GENTRANDOM, TOBRATROUGH, TOBRAPEAK, TOBRARND, AMIKACINPEAK, AMIKACINTROU, AMIKACIN,  in the last 72 hours   Microbiology: No results found for this or any previous visit (from the past 720 hour(s)).  Medical History: Past Medical History  Diagnosis Date  . Hypertension   . Asthma     Medications:  See med history  Assessment: 53 yof presented to the ED with fever. She received chemo 2 weeks ago for breast cancer. Currently afebrile but WBC is low at 1.7. To start empiric cefepime for febrile neutropenia. Pt has good renal fxn.   Goal of Therapy:  Eradication of infection  Plan:  1. Cefepime 2gm IV Q8H 2. F/u renal fxn, C&S, clinical status  Connie Lasater, Drake Leach 11/24/2012,12:32 PM

## 2012-11-24 NOTE — H&P (Signed)
Hospital Admission Note Date: 11/24/2012  Patient name: Kathryn [name] Medical record number: [id number] Date of birth: 1946/01/06 Age: 67 y.o. Gender: female PCP: Pcp Not In System, [facility] [place] hospital  Medical Service: IMTS  Attending physician: Dr. Shela Nevin     Internal Medicine Teaching Service Contact Information  Weekday Hours (7AM-5PM):  1st Contact: Dr. Claudell Kyle Pager:[phone] 2nd Contact: Dr. Bosie Clos Pager:[phone]  ** If no return call within 15 minutes (after trying both pagers listed above), please call after hours pagers.  After 5 pm or weekends: 1st Contact: Pager: [phone] 2nd Contact: Pager: [phone]  Chief Complaint: Neutropenia and fever  History of Present Illness:  Patient is 67 year old lady with past medical history of breast cancer (ongoing chemotherapy), hypertension, COPD, depression, AAA, who presents with fever and neutropenia. Patient reports that she was initially diagnosed with left breast cancer 5 years ago. She received lumpectomy and radiation therapy. She has been doing well after treatment. Unfortunately, she was found to have a mass on the right breast 03/2012 by ultrasound. She received right breast mastectomy 06/2012, then started weekly chemotherapy on 10/25/12 in [facility] hospital. Since this Wednesday, patient started having nasal congestion, sore throat, dry cough and muffled ear. She did not receive chemotherapy this Wednesday. She did not have chest pain, shortness of breath, fever initially. This morning she started having fever and chills. Her temperature was 101.0 in this morning. She was told to not take Tylenol by her doctor if she develops a fever. She did not take any medications at home and came to ED for further evaluation.   Her temperature was 99.3 ED. She still has dry cough, sore throat, muffled ears, but no shortness of breath and chest pain. Her blood pressure was 123/66 and a heart rate 88/minute when I saw patient in ED. She had a  negative urinalysis for UTI and a negative chest x-ray for infiltration. She was found to have a neutrophil count of102. We were called to admit patient for neutropenia and fever.  ROS:  Denies Chest pain, SOB,  abdominal pain, diarrhea, constipation, dysuria, urgency, frequency, hematuria, joint pain or leg swelling, skin rashes, vaginal discharge, neck pain or rigidity.   Meds: No current outpatient prescriptions on file.  Allergies: Allergies as of 11/24/2012 - Review Complete 11/24/2012  Allergen Reaction Noted  . Ivp dye [iodinated diagnostic agents]  06/16/2012  . Milk-related compounds Other (See Comments) 11/24/2012  . Sulfa antibiotics Hives 06/16/2012   Past Medical History  Diagnosis Date  . Hypertension   . Asthma    Past Surgical History  Procedure Laterality Date  . Abdominal hysterectomy    . Cholecystectomy    . Breast surgery    . Rotator cuff repair     Family Medical History: Father had prostate cancer. Mother had cancer (not very sure what kind of cancer). One breather has end-stage renal disease on HD, likely due to hypertension. 2 sisters who are healthy.   History   Social History  . Marital Status: Divorced    Spouse Name: N/A    Number of Children: N/A  . Years of Education: N/A   Occupational History  . Not on file.   Social History Main Topics  . Smoking status:  intermittently smoked half pack a day for 13 years, quit 15-20 years ago.   . Smokeless tobacco:   . Alcohol Use: No  . Drug Use: No  . Sexual Activity: Not on file   Other Topics Concern  . Not on  file   Social History Narrative  .  Divorced, has 4 children (1 son and 3 daughters), not drinking alcohol, not using drugs. Currently lives with her son in [place].     Review of Systems: Full 14-point review of systems otherwise negative except as noted above in HPI.  Physical Exam:   Filed Vitals:   11/24/12 0944 11/24/12 1115 11/24/12 1200 11/24/12 1333  BP:  123/66  109/75 127/69  Pulse:  87 92 92  Temp:    99.2 F (37.3 C)  TempSrc:      Resp:    20  Height: 5\' 6"  (1.676 m)     Weight: 164 lb (74.39 kg)     SpO2:  99% 98% 100%   General: Not in acute distress. HEENT: PERRL, EOMI, no scleral icterus, No JVD or bruit Cardiac: S1/S2, RRR, No murmurs, gallops or rubs Pulm: Good air movement bilaterally. Clear to auscultation bilaterally. No rales, wheezing, rhonchi or rubs. Abd: Soft, nondistended, nontender, no rebound pain, no organomegaly, BS present. No bruit BacK: There is a mass over the right upper back, approximately 5 x 6 cm in size. The mass is soft with clear margin, mildly tender. There is no warmth or redness over the mass. Ext: No edema. 2+DP/PT pulse bilaterally Musculoskeletal: No joint deformities, erythema, or stiffness, ROM full Skin: No rashes.  Neuro: Alert and oriented X3, cranial nerves II-XII grossly intact, muscle strength 5/5 in all extremeties, sensation to light touch intact. Brachial reflex 2+ bilaterally.  Psych: Patient is not psychotic, no suicidal or hemocidal ideation.  Lab results: Basic Metabolic Panel:  Recent Labs  11/91/47 1015  NA 141  K 3.1*  CL 104  CO2 27  GLUCOSE 95  BUN 7  CREATININE 0.83  CALCIUM 8.1*   Liver Function Tests:  Recent Labs  11/24/12 1015  AST 11  ALT 14  ALKPHOS 46  BILITOT 0.3  PROT 5.9*  ALBUMIN 2.8*   No results found for this basename: LIPASE, AMYLASE,  in the last 72 hours No results found for this basename: AMMONIA,  in the last 72 hours CBC:  Recent Labs  11/24/12 1015  WBC 1.7*  NEUTROABS 0.1*  HGB 9.5*  HCT 28.0*  MCV 77.6*  PLT 104*   Cardiac Enzymes: No results found for this basename: CKTOTAL, CKMB, CKMBINDEX, TROPONINI,  in the last 72 hours BNP: No results found for this basename: PROBNP,  in the last 72 hours D-Dimer: No results found for this basename: DDIMER,  in the last 72 hours CBG: No results found for this basename: GLUCAP,  in  the last 72 hours Hemoglobin A1C: No results found for this basename: HGBA1C,  in the last 72 hours Fasting Lipid Panel: No results found for this basename: CHOL, HDL, LDLCALC, TRIG, CHOLHDL, LDLDIRECT,  in the last 72 hours Thyroid Function Tests: No results found for this basename: TSH, T4TOTAL, FREET4, T3FREE, THYROIDAB,  in the last 72 hours Anemia Panel: No results found for this basename: VITAMINB12, FOLATE, FERRITIN, TIBC, IRON, RETICCTPCT,  in the last 72 hours Coagulation: No results found for this basename: LABPROT, INR,  in the last 72 hours Urine Drug Screen: Drugs of Abuse  No results found for this basename: labopia,  cocainscrnur,  labbenz,  amphetmu,  thcu,  labbarb    Alcohol Level: No results found for this basename: ETH,  in the last 72 hours Urinalysis:  Recent Labs  11/24/12 1055  COLORURINE YELLOW  LABSPEC 1.007  PHURINE 6.5  GLUCOSEU NEGATIVE  HGBUR NEGATIVE  BILIRUBINUR NEGATIVE  KETONESUR NEGATIVE  PROTEINUR NEGATIVE  UROBILINOGEN 0.2  NITRITE NEGATIVE  LEUKOCYTESUR NEGATIVE   Misc. Labs:   Imaging results:  Dg Chest 2 View  11/24/2012   CLINICAL DATA:  Fever.  EXAM: CHEST  2 VIEW  COMPARISON:  None.  FINDINGS: Cardiac silhouette is normal. Tortuosity of the thoracic aorta is noted. No focal infiltrate or edema. No pleural effusion or pneumothorax. No acute osseous abnormality.  IMPRESSION: No active cardiopulmonary disease.   Electronically Signed   By: Jerene Dilling M.D.   On: 11/24/2012 10:42    Other results:  EKG:   Assessment & Plan by Problem: Principal Problem:   Fever and neutropenia Active Problems:   Hypertension   Breast cancer   COPD (chronic obstructive pulmonary disease)   AAA (abdominal aortic aneurysm)   Depression  67 year old lady with past medical history of breast cancer (ongoing chemotherapy), hypertension, COPD, depression, AAA, who presents with fever and neutropenia with neutrophil count of 102 on  admission. Patient has some symptoms for upper respiratory viral infection. Chest x-ray negative. Urinalysis negative for UTI. Potassium 3.1 on admission.   #:  Fever and neutropenia: Her fever is likely due to upper respiratory viral infection. In consistence with the diagnosis of upper respiratory infection, patient has nasal congestion, sore throat, sore throat, dry cough and muffled ears. PNA is unlikely given no chest pain and negative CXR. UTI is also unlikely given negative urine analysis. Review of system did not reveal a clue for any specific infection. Currently patient is hemodynamically stable. She does not seem to have sepsis. Since patient is receiving chemotherapy for breast cancer and has neutropenia with neutrophil count of 102, it is important to cover broadly before the diagnosis is made. Blood cultures and urine cultures were obtained in ED before antibiotics. IV cefepime was started in ED.  -will admit to MedSurg bed for observation -will continue cefepime per pharmacy.  -will rule to flu by pcr -Pending blood and urine culture, will narrow down the antibiotics, or discontinue antibiotics depending on clinical progress. -will repeat CBC with differ in AM.  #: Hypokalemia: Potassium 3.1 on admission.  -Will give po 40 mEq of potassium chloride -f/u BMP  #: HTN: Blood pressure 123/66 on admission. Patient does not have chest pain, shortness of breath, or leg edema. No signs of congestive heart failure. -will continue home medications.  #: [place] cancer: Patient was initially diagnosed with left breast cancer 5 years ago. She received lumpectomy and radiation therapy. She has been doing well after treatment. Unfortunately, she was found to have a mass on the right breast on 03/2012 by ultrasound. She is received right breast mastectomy 5/20 14. She started weekly chemotherapy on 10/25/12 in [facility] hospital. She tolerated chemotherapy except for having developed neutropenia. Her  chemotherapy was held this week because of recent upper respiratory symptoms.   -Try to get medical record - Hold chemotherapy  # :COPD: it is stable. Patient does not have chest pain, shortness of breath, wheezing. Her lung auscultation is clear bilaterally. -will continue home albuterol inhaler when necessary to  # AAA: Patient was found to have AAA several years ago per patient. Ultrasound done this year (few months ago) showed that the size is 3.0 cm in size and stable.  -Will control Bp.   #: Mass on the right upper back: Etiology is not clear. Patient reports that it was noticed after her second surgery (5/14). Differential  diagnoses include metastasized breast cancer and lipoma. Her oncologist is aware of this mases and planed to work it up after she completes her chemotherapy per patient. -will defer work up now -will let patient follow up this issue with her oncologist after discharge.  #  F/E/N  -SL -Hypokalemia:  Kdur 40 meQ x1.  will monitor electrolytes by checking BMP -Diet: Heart health diet  # DVT px: Lovenox sq  Dispo: Disposition is deferred at this time, awaiting improvement of current medical problems. Anticipated discharge in approximately 1 day(s).   The patient does have a current PCP (Pcp Not In System), therefore will be requiring PCP follow-up after discharge.   The patient does have transportation limitations that hinder transportation to clinic appointments.  Signed:  Lorretta Harp, MD PGY3, Internal Medicine Teaching Service Pager: [phone]  11/24/2012, 2:12 PM

## 2012-11-24 NOTE — ED Notes (Signed)
Pt is here for fever, and has chemo 2 weeks ago for breast cancer at [facility] hospital , pt reports fever of 100 this am and did not receive chemo last week due to cold.

## 2012-11-24 NOTE — Progress Notes (Signed)
INITIAL NUTRITION ASSESSMENT  DOCUMENTATION CODES Per approved criteria  -Severe malnutrition in the context of chronic illness  Pt meets criteria for Severe MALNUTRITION in the context of Chronic illness as evidenced by 11% wt loss in less than 3 months (per pt) and energy intake <50% of estimated energy needs for >1 month based on pt's report.   INTERVENTION: Provide Ensure Complete BID Provide Carnation Instant Breakfast one daily Encourage PO intake  NUTRITION DIAGNOSIS: Inadequate oral intake related to poor appetite as evidenced by 11% wt loss in less than 3 months.   Goal: Pt to meet >/= 90% of their estimated nutrition needs  Monitor:  PO intake Weight Labs  Reason for Assessment: Malnutrition Screening Tool, score of 2  67 y.o. female  Admitting Dx: Fever and neutropenia  ASSESSMENT: 67 year old lady with past medical history of breast cancer (ongoing chemotherapy), hypertension, COPD, depression, AAA, who presents with fever and neutropenia.  Patient reports that she was initially diagnosed with left breast cancer 5 years ago. She received lumpectomy and radiation therapy. She has been doing well after treatment. Unfortunately, she was found to have a mass on the right breast 03/2012 by ultrasound. She received right breast mastectomy 06/2012, then started weekly chemotherapy on 10/25/12 in [facility] hospital.   Pt reports that she has had a poor appetite for the past few months. She used to eat 2 big meals daily and states she has been eating <50% of meals daily for the past few months. She states she used to weigh 184 lbs. She states she was drinking 1/2-1 Boost Supplements daily PTA. She complains of taste changes and sensitivity to lemon, vinegar, and spicy foods. Discussed tips for taste changes. Encouraged pt to eat small frequent amounts throughout the day and to drink 1-2 Boost supplements daily. Reviewed protein-rich foods and encouraged pt to incorporate them daily  at each meal. Encouraged weight maintenance during chemotherapy treatment. Pt only finished about 25% of lunch tray.   Nutrition Focused Physical Exam:  Subcutaneous Fat:  Orbital Region: wnl Upper Arm Region: wnl Thoracic and Lumbar Region: wnl  Muscle:  Temple Region: wnl Clavicle Bone Region: wnl Clavicle and Acromion Bone Region: wnl Scapular Bone Region: mild wasting Dorsal Hand: mild wasting Patellar Region: mild wasting Anterior Thigh Region: wnl Posterior Calf Region: wnl  Edema: none  Height: Ht Readings from Last 1 Encounters:  11/24/12 5\' 6"  (1.676 m)    Weight: Wt Readings from Last 1 Encounters:  11/24/12 164 lb (74.39 kg)    Ideal Body Weight: 130 lbs  % Ideal Body Weight: 126%  Wt Readings from Last 10 Encounters:  11/24/12 164 lb (74.39 kg)    Usual Body Weight: 186 lbs  % Usual Body Weight: 88%  BMI:  Body mass index is 26.48 kg/(m^2).  Estimated Nutritional Needs: Kcal: 1900-2100 Protein: 90-100 grams Fluid: 1.9-2.1 L/day  Skin: WDL  Diet Order: Cardiac  EDUCATION NEEDS: -Education needs addressed  No intake or output data in the 24 hours ending 11/24/12 1601  Last BM: 10/9  Labs:   Recent Labs Lab 11/24/12 1015  NA 141  K 3.1*  CL 104  CO2 27  BUN 7  CREATININE 0.83  CALCIUM 8.1*  GLUCOSE 95    CBG (last 3)  No results found for this basename: GLUCAP,  in the last 72 hours  Scheduled Meds: . ceFEPime (MAXIPIME) IV  2 g Intravenous Q8H  . enoxaparin (LOVENOX) injection  40 mg Subcutaneous Q24H  . sodium  chloride  3 mL Intravenous Q12H    Continuous Infusions:   Past Medical History  Diagnosis Date  . Hypertension   . Asthma     Past Surgical History  Procedure Laterality Date  . Abdominal hysterectomy    . Cholecystectomy    . Breast surgery    . Rotator cuff repair      Ian Malkin RD, LDN Inpatient Clinical Dietitian Pager: [phone] After Hours Pager: [phone]

## 2012-11-25 LAB — BASIC METABOLIC PANEL
BUN: 9 mg/dL (ref 6–23)
Calcium: 8.4 mg/dL (ref 8.4–10.5)
Chloride: 107 mEq/L (ref 96–112)
Creatinine, Ser: 0.68 mg/dL (ref 0.50–1.10)
GFR calc Af Amer: >90 mL/min (ref >90)
GFR calc non Af Amer: 88 mL/min — ABNORMAL LOW (ref >90)

## 2012-11-25 LAB — URINE CULTURE: Colony Count: NO GROWTH

## 2012-11-25 LAB — CBC WITH DIFFERENTIAL/PLATELET
Basophils Absolute: 0 10*3/uL (ref 0.0–0.1)
Basophils Relative: 0 % (ref 0–1)
HCT: 29.6 % — ABNORMAL LOW (ref 36.0–46.0)
Lymphocytes Relative: 67 % — ABNORMAL HIGH (ref 12–46)
Lymphs Abs: 1.5 10*3/uL (ref 0.7–4.0)
MCHC: 34.5 g/dL (ref 30.0–36.0)
Monocytes Absolute: 0.7 10*3/uL (ref 0.1–1.0)
Neutro Abs: 0.1 10*3/uL — ABNORMAL LOW (ref 1.7–7.7)
Neutrophils Relative %: 2 % — ABNORMAL LOW (ref 43–77)
Platelets: 131 10*3/uL — ABNORMAL LOW (ref 150–400)
RBC: 3.85 MIL/uL — ABNORMAL LOW (ref 3.87–5.11)
RDW: 14.1 % (ref 11.5–15.5)
WBC: 2.2 10*3/uL — ABNORMAL LOW (ref 4.0–10.5)

## 2012-11-25 MED ORDER — BENZONATATE 100 MG PO CAPS
200.0000 mg | ORAL_CAPSULE | Freq: Three times a day (TID) | ORAL | Status: DC | PRN
  Administered 2012-11-25 – 2012-11-26 (×2): 200 mg via ORAL
  Filled 2012-11-25 (×2): qty 2

## 2012-11-25 MED ORDER — ZOLPIDEM TARTRATE 5 MG PO TABS
5.0000 mg | ORAL_TABLET | Freq: Once | ORAL | Status: AC
  Administered 2012-11-26: 5 mg via ORAL
  Filled 2012-11-25: qty 1

## 2012-11-25 NOTE — H&P (Signed)
INTERNAL MEDICINE TEACHING SERVICE Attending Admission Note  Date: 11/25/2012  Patient name: Kathryn [name]  Medical record number: [id number]  Date of birth: 03-13-1945    I have seen and evaluated Kathryn [name] and discussed their care with the Residency Team.   67 yr old female with hx of breast CA currently on chemotherapy at the [facility] (~3-4 weeks ago), COPD, HTN, depression, AAA, presented due to fever. The patient admits to nasal congestion, sore throat, dry cough, fever, chills.  Noted temperature of 101.0 F the morning of admission.  She denies any productive cough, abdominal pain, dysuria, urinary frequency, headache. Admits to constipation. Denies melena or hematochezia.  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 Would have her discuss this with her oncologist at the [facility].    She has a marked neutropenia. BC sent. For now, would continue Cefepime 2 g IV q8hrs.  No clear source of infection. Would discuss with her oncologist if recommended to give Neulasta or if she received it and did not respond.  Jonah Blue, DO, FACP Faculty [facility] Internal Medicine Residency Program 11/25/2012, 9:59 AM

## 2012-11-25 NOTE — Progress Notes (Signed)
I have seen the patient and reviewed the daily progress note by Kerrie Pleasure MS 4 and discussed the care of the patient with them.  See below for documentation of my findings, assessment, and plans.  Subjective: No acute events overnight, Tmx 100., feels improved, with no further throat irritation  Objective: Vital signs in last 24 hours: Filed Vitals:   11/24/12 1333 11/24/12 2130 11/25/12 0103 11/25/12 0619  BP: 127/69 136/73  111/60  Pulse: 92 91  70  Temp: 99.2 F (37.3 C) 100.1 F (37.8 C) 99.4 F (37.4 C) 98.4 F (36.9 C)  TempSrc:  Oral Oral Oral  Resp: 20 18  18   Height:      Weight:      SpO2: 100% 98%  100%   Weight change:  No intake or output data in the 24 hours ending 11/25/12 1119  Physical Exam: General: Well-developed, well-nourished, AA, in no acute distress;  Head: +alopecia otherwise normocephalic, atraumatic. Eyes: PERRLA, EOMI Throat: Oropharynx nonerythematous, no exudate appreciated.  Chest/Back: Normal respiratory effort. Clear to auscultation bilaterally from apices to bases without crackles or wheezes appreciated. + lipoma right upper back, mildly TTP Heart: normal rate, regular rhythm, normal S1 and S2, no gallop, murmur, or rubs appreciated. Abdomen: BS normoactive. Soft, Nondistended, non-tender. No masses or organomegaly appreciated. Extremities: No pretibial edema, distal pulses intact Neurologic: grossly non-focal, alert and oriented x3, appropriate and cooperative throughout examination.    Lab Results: Reviewed and documented in Electronic Record Micro Results: Reviewed and documented in Electronic Record Studies/Results: Reviewed and documented in Electronic Record Medications: I have reviewed the patient's current medications. Scheduled Meds: . ceFEPime (MAXIPIME) IV  2 g Intravenous Q8H  . enoxaparin (LOVENOX) injection  40 mg Subcutaneous Q24H  . feeding supplement (ENSURE COMPLETE)  237 mL Oral Q24H  . polyethylene glycol   17 g Oral Daily  . sodium chloride  3 mL Intravenous Q12H   Continuous Infusions:  PRN Meds:.sodium chloride, acetaminophen, acetaminophen, albuterol, docusate sodium, sodium chloride  Assessment/Plan: #HD 2 for Ms. [facility] admitted with Neutropinc fever in setting of current chemotherapy treatment for breast cancer at [facility].  #1. Neutropenic fever: will cont with Cefepime and continue to monitor for another day. [facility] contacted and will follow-up with patient on Monday 11/27/12 (in 2 days) -cont IV Antibx with plan to convert to oral with close f/u on discharge with Dr. Scheryl Marten at [facility]   Recent Labs Lab 11/24/12 1015 11/25/12 0645  HGB 9.5* 10.2*  HCT 28.0* 29.6*  WBC 1.7* 2.2*  PLT 104* 131*   CBC    Component Value Date/Time   WBC 2.2* 11/25/2012 0645   RBC 3.85* 11/25/2012 0645   HGB 10.2* 11/25/2012 0645   HCT 29.6* 11/25/2012 0645   PLT 131* 11/25/2012 0645   MCV 76.9* 11/25/2012 0645   MCH 26.5 11/25/2012 0645   MCHC 34.5 11/25/2012 0645   RDW 14.1 11/25/2012 0645   LYMPHSABS 1.5 11/25/2012 0645   MONOABS 0.7 11/25/2012 0645   EOSABS 0.0 11/25/2012 0645   BASOSABS 0.0 11/25/2012 0645     Dispo: Disposition is deferred at this time, awaiting improvement of current medical problems.  Anticipated discharge in approximately 1 day(s).   The patient does have a current PCP (Pcp Not In System) and does need an [facility] hospital follow-up appointment after discharge.  The patient does not have transportation limitations that hinder transportation to clinic appointments.  .Services Needed at time of discharge: Y =  Yes, Blank = No PT:   OT:   RN:   Equipment:   Other:     LOS: 1 day   Manuela Schwartz, MD 11/25/2012, 11:19 AM

## 2012-11-25 NOTE — Progress Notes (Signed)
Subjective: Ms. Molden feels fine this morning but complains of some constipation. Her last BM was on Tuesday. She denies any SOB, dysuria, increased urinary frequency, abdominal pain, diarrhea, or new rashes. Her cough, sore throat, and nasal congestion are improved from yesterday. She was able to call the [facility] this morning and notify them that she was currently hospitalized. Her oncologist is Dr. Scheryl Marten and she is scheduled for her next chemotherapy appointment on Wednesday.   Objective: Vital signs in last 24 hours: Filed Vitals:   11/24/12 1333 11/24/12 2130 11/25/12 0103 11/25/12 0619  BP: 127/69 136/73  111/60  Pulse: 92 91  70  Temp: 99.2 F (37.3 C) 100.1 F (37.8 C) 99.4 F (37.4 C) 98.4 F (36.9 C)  TempSrc:  Oral Oral Oral  Resp: 20 18  18   Height:      Weight:      SpO2: 100% 98%  100%     Physical Exam  Constitutional: She is well-developed, well-nourished, and in no distress. Vital signs are normal.  HENT:  Mouth/Throat: Oropharynx is clear and moist and mucous membranes are normal. No oropharyngeal exudate.  Neck: Normal range of motion.  Cardiovascular: Normal rate, regular rhythm, S1 normal, S2 normal and normal heart sounds.   Pulmonary/Chest: Effort normal and breath sounds normal. No respiratory distress. She has no wheezes. She has no rales.  Abdominal: Soft. Bowel sounds are normal.  Skin: Skin is warm, dry and intact.  Soft mass over the right upper back, approximately 5 x 6 cm in size, clear margin, mildly tender, no warmth or erythema.    Lab Results: Basic Metabolic Panel:  Recent Labs Lab 11/24/12 1015 11/25/12 0645  NA 141 142  K 3.1* 3.6  CL 104 107  CO2 27 26  GLUCOSE 95 88  BUN 7 9  CREATININE 0.83 0.68  CALCIUM 8.1* 8.4   Liver Function Tests:  Recent Labs Lab 11/24/12 1015  AST 11  ALT 14  ALKPHOS 46  BILITOT 0.3  PROT 5.9*  ALBUMIN 2.8*   CBC:  Recent Labs Lab 11/24/12 1015 11/25/12 0645  WBC 1.7* 2.2*    NEUTROABS 0.1* 0.1*  HGB 9.5* 10.2*  HCT 28.0* 29.6*  MCV 77.6* 76.9*  PLT 104* 131*   Urinalysis:  Recent Labs Lab 11/24/12 1055  COLORURINE YELLOW  LABSPEC 1.007  PHURINE 6.5  GLUCOSEU NEGATIVE  HGBUR NEGATIVE  BILIRUBINUR NEGATIVE  KETONESUR NEGATIVE  PROTEINUR NEGATIVE  UROBILINOGEN 0.2  NITRITE NEGATIVE  LEUKOCYTESUR NEGATIVE   Micro Results: Recent Results (from the past 240 hour(s))  URINE CULTURE     Status: None   Collection Time    11/24/12 10:55 AM      Result Value Range Status   Specimen Description URINE, CLEAN CATCH   Final   Special Requests NONE   Final   Culture  Setup Time     Final   Value: 11/24/2012 11:54     Performed at [facility]     Final   Value: NO GROWTH     Performed at [facility]   Culture     Final   Value: NO GROWTH     Performed at [facility]   Report Status 11/25/2012 FINAL   Final   Studies/Results: Dg Chest 2 View  11/24/2012   CLINICAL DATA:  Fever.  EXAM: CHEST  2 VIEW  COMPARISON:  None.  FINDINGS: Cardiac silhouette is normal. Tortuosity of the  thoracic aorta is noted. No focal infiltrate or edema. No pleural effusion or pneumothorax. No acute osseous abnormality.  IMPRESSION: No active cardiopulmonary disease.   Electronically Signed   By: Jerene Dilling M.D.   On: 11/24/2012 10:42   Medications: I have reviewed the patient's current medications. Scheduled Meds: . ceFEPime (MAXIPIME) IV  2 g Intravenous Q8H  . enoxaparin (LOVENOX) injection  40 mg Subcutaneous Q24H  . feeding supplement (ENSURE COMPLETE)  237 mL Oral Q24H  . polyethylene glycol  17 g Oral Daily  . sodium chloride  3 mL Intravenous Q12H   Continuous Infusions:  PRN Meds:.sodium chloride, acetaminophen, acetaminophen, albuterol, docusate sodium, sodium chloride  Assessment/Plan: This is a 67 yo woman with h/o breast cancer (ongoing chemotherapy) who was admitted with reported fever to 101 and  neutropenia with ANC of 102 on admission.  # Fever and neutropenia: At this time, it appears that the etiology of her fever is most likely due to an upper respiratory viral infection, given her subjective symptoms of nasal congestion, sore throat, and nonproductive cough on admission. These symptoms are improving. There are no alternative s/s of infection indicated by her physical exam or labs. Her chest x-ray was negative for pneumonia and her urinalysis was negative for UTI. Her influenza and H1N1 testing was negative. Blood and urine cultures are NGTD. She had a Tmax of 100.1 overnight which responded to Tylenol. Otherwise her vital signs have remained stable. Her absolute neutrophil count on admission was 102 and today it is 44. We will continue broad empiric antibiotic coverage with IV cefepime and observe her for at least one more day. - continue cefepime 2g IV q8hr per pharmacy.  - CBC with diff tomorrow morning - have notified [facility] that Ms. Kaczmarczyk is currently hospitalized. They will call the patient on Monday to schedule a hospital follow-up appointment.  # HTN: She has been normotensive while inpatient despite not being restarted on her home medications. - pending pharmacy medication reconciliation  - continue to monitor  # Constipation: Per the patient, her last BM was 4 days ago.  - Colace and Miralax prn - Will consider enema if no relief  # Breast cancer: Stable. See admission H&P.  # COPD: Stable. See admission H&P.  # AAA: Stable. See admission H&P.  # Mass on the right upper back: Stable. See admission H&P.   # DVT px: Lovenox sq   Dispo: Disposition is deferred at this time, awaiting improvement of current medical problems. Anticipated discharge in approximately 1 day(s).   The patient does have a current PCP (Pcp Not In System), therefore will be requiring PCP follow-up after discharge.   The patient does have transportation limitations that hinder  transportation to clinic appointments.  This is a Psychologist, occupational Note.  The care of the patient was discussed with Dr. Kem Kays and Dr. Bosie Clos and the assessment and plan formulated with their assistance.  Please see their attached note for official documentation of the daily encounter.   LOS: 1 day   Kerrie Pleasure, Med Student 11/25/2012, 9:25 AM

## 2012-11-26 DIAGNOSIS — J449 Chronic obstructive pulmonary disease, unspecified: Secondary | ICD-10-CM

## 2012-11-26 MED ORDER — BENZONATATE 100 MG PO CAPS
200.0000 mg | ORAL_CAPSULE | ORAL | Status: DC | PRN
  Administered 2012-11-26 – 2012-11-27 (×3): 200 mg via ORAL
  Filled 2012-11-26 (×3): qty 2

## 2012-11-26 MED ORDER — HYDROCOD POLST-CHLORPHEN POLST 10-8 MG/5ML PO LQCR: 5.0000 mL | Freq: Every evening | ORAL | Status: DC | PRN

## 2012-11-26 MED ORDER — ZOLPIDEM TARTRATE 5 MG PO TABS
5.0000 mg | ORAL_TABLET | Freq: Every evening | ORAL | Status: DC | PRN
  Administered 2012-11-26: 5 mg via ORAL
  Filled 2012-11-26: qty 1

## 2012-11-26 NOTE — Progress Notes (Addendum)
Subjective: Afebrile overnight, no acute events but patient states that she did not rest well secondary coughing  Objective: Vital signs in last 24 hours: Filed Vitals:   11/25/12 0619 11/25/12 1450 11/25/12 2119 11/26/12 0548  BP: 111/60 139/78 124/68 122/70  Pulse: 70 93 92 79  Temp: 98.4 F (36.9 C) 99.5 F (37.5 C) 99.1 F (37.3 C) 98.7 F (37.1 C)  TempSrc: Oral Oral Oral Oral  Resp: 18 18 18 17   Height:      Weight:    165 lb 2 oz (74.9 kg)  SpO2: 100% 100% 98% 100%   Weight change: 1 lb 2 oz (0.51 kg) No intake or output data in the 24 hours ending 11/26/12 0749  Physical Exam: General: Well-developed, well-nourished, AA, in no acute distress; sitting in bedside recliner Head: +alopecia otherwise normocephalic, atraumatic. Eyes: PERRLA, EOMI, murky sclera Throat: Oropharynx nonerythematous, no exudate appreciated, poor dentition Chest/Back: Normal respiratory effort. Clear to auscultation bilaterally from apices to bases without crackles or wheezes appreciated. + lipoma right upper back, mildly TTP Heart: normal rate, regular rhythm, normal S1 and S2, no gallop, murmur, or rubs appreciated. Abdomen: BS normoactive. Soft, Nondistended, non-tender. No masses or organomegaly appreciated. Extremities: No pretibial edema, distal pulses intact Neurologic: grossly non-focal, alert and oriented x3, appropriate and cooperative throughout examination.  Lab Results: Basic Metabolic Panel:  Recent Labs Lab 11/24/12 1015 11/25/12 0645  NA 141 142  K 3.1* 3.6  CL 104 107  CO2 27 26  GLUCOSE 95 88  BUN 7 9  CREATININE 0.83 0.68  CALCIUM 8.1* 8.4   Liver Function Tests:  Recent Labs Lab 11/24/12 1015  AST 11  ALT 14  ALKPHOS 46  BILITOT 0.3  PROT 5.9*  ALBUMIN 2.8*   CBC:  Recent Labs Lab 11/24/12 1015 11/25/12 0645  WBC 1.7* 2.2*  NEUTROABS 0.1* 0.1*  HGB 9.5* 10.2*  HCT 28.0* 29.6*  MCV 77.6* 76.9*  PLT 104* 131*   Urinalysis:  Recent  Labs Lab 11/24/12 1055  COLORURINE YELLOW  LABSPEC 1.007  PHURINE 6.5  GLUCOSEU NEGATIVE  HGBUR NEGATIVE  BILIRUBINUR NEGATIVE  KETONESUR NEGATIVE  PROTEINUR NEGATIVE  UROBILINOGEN 0.2  NITRITE NEGATIVE  LEUKOCYTESUR NEGATIVE    Micro Results: Recent Results (from the past 240 hour(s))  CULTURE, BLOOD (ROUTINE X 2)     Status: None   Collection Time    11/24/12 10:15 AM      Result Value Range Status   Specimen Description BLOOD LEFT ANTECUBITAL   Final   Special Requests BOTTLES DRAWN AEROBIC ONLY 10CC   Final   Culture  Setup Time     Final   Value: 11/24/2012 16:15     Performed at [facility]   Culture     Final   Value:        BLOOD CULTURE RECEIVED NO GROWTH TO DATE CULTURE WILL BE HELD FOR 5 DAYS BEFORE ISSUING A FINAL NEGATIVE REPORT     Performed at [facility]   Report Status PENDING   Incomplete  CULTURE, BLOOD (ROUTINE X 2)     Status: None   Collection Time    11/24/12 10:20 AM      Result Value Range Status   Specimen Description BLOOD ARM LEFT   Final   Special Requests BOTTLES DRAWN AEROBIC ONLY 10CC   Final   Culture  Setup Time     Final   Value: 11/24/2012 16:15     Performed at  [facility]     Final   Value:        BLOOD CULTURE RECEIVED NO GROWTH TO DATE CULTURE WILL BE HELD FOR 5 DAYS BEFORE ISSUING A FINAL NEGATIVE REPORT     Performed at [facility]   Report Status PENDING   Incomplete  URINE CULTURE     Status: None   Collection Time    11/24/12 10:55 AM      Result Value Range Status   Specimen Description URINE, CLEAN CATCH   Final   Special Requests NONE   Final   Culture  Setup Time     Final   Value: 11/24/2012 11:54     Performed at [facility]     Final   Value: NO GROWTH     Performed at [facility]   Culture     Final   Value: NO GROWTH     Performed at [facility]   Report Status 11/25/2012 FINAL   Final   Studies/Results: Dg Chest 2  View  11/24/2012   CLINICAL DATA:  Fever.  EXAM: CHEST  2 VIEW  COMPARISON:  None.  FINDINGS: Cardiac silhouette is normal. Tortuosity of the thoracic aorta is noted. No focal infiltrate or edema. No pleural effusion or pneumothorax. No acute osseous abnormality.  IMPRESSION: No active cardiopulmonary disease.   Electronically Signed   By: Jerene Dilling M.D.   On: 11/24/2012 10:42   Medications: I have reviewed the patient's current medications. Scheduled Meds: . ceFEPime (MAXIPIME) IV  2 g Intravenous Q8H  . enoxaparin (LOVENOX) injection  40 mg Subcutaneous Q24H  . feeding supplement (ENSURE COMPLETE)  237 mL Oral Q24H  . polyethylene glycol  17 g Oral Daily  . sodium chloride  3 mL Intravenous Q12H   Continuous Infusions:  PRN Meds:.sodium chloride, acetaminophen, acetaminophen, albuterol, benzonatate, docusate sodium, sodium chloride  Assessment/Plan: HD #2 for Ms. Forbis admitted with neutropenic fever (ANC 100) in setting of current chemotherapy for breast cancer.  1. Neutropenic Fever: high risk given chemotherapy thus managed empirically with IV cefepime.  She has been afebrile >24 hrs but has ANC <100. Clinically she appears stable and without source of infection identified but suspect that this was precipitated by a viral URI from family sick contacts. Given that she is high-risk with ANC <100 she will need continued antibiotics x14 days or until resolution of neutropenia. Once ANC>500 outpt regimen could include amoxicillin-clavulanate 500mg /125 po q8h plus cipro 500 mg q12 or moxifloxacin 400 mg qd for 7 days. Medicine reconciliation still not complete and unclear if patient has been on Neulasta (colony-stimulating factor agent) which has been shown to reduce the incidence of neutropenic fever and should be considered for patients in whom the anticipated risk of fever and neutropenia is >20% with a specific chemotherapy agent.  If she is undergoing palliative chemo tx then a dose  reduction may be a more appropriate approach. For now, will keep inpatient, cont IV cefepime and trend ANC. Discussed plan with patient and she is agreeable. -consult pharmacy for further reconciliation of meds (no answer at [phone] [id number]) -cont attempt to get [facility] med records (Dr. Aundra Millet Diehl-Onc)  2. Upper Respiratory Infection: resolving, likely viral -cont symptomatic tx  3. Breast Cancer: chemo per [facility] Dr. Scheryl Marten, ascertain appropriateness of Neulasta as above  4. Hypertension: normotensive w/o home regimen of antihypertensives  5. Constipation: resolved  with Miralax and Colace  6. COPD in setting of asthma: w/o exacerbation, stable on prn albuterol prn  7. DVT ppx: Lovenox  Dispo: Disposition is deferred at this time, awaiting improvement of current medical problems.  Anticipated discharge in approximately 1-2 day(s).   The patient does have a current PCP (Pcp Not In System) and does not need an [facility] hospital follow-up appointment after discharge.  The patient does not have transportation limitations that hinder transportation to clinic appointments.  .Services Needed at time of discharge: Y = Yes, Blank = No PT:   OT:   RN:   Equipment:   Other:     LOS: 2 days   Kathryn Schwartz, MD 11/26/2012, 7:49 AM

## 2012-11-27 DIAGNOSIS — E43 Unspecified severe protein-calorie malnutrition: Secondary | ICD-10-CM | POA: Diagnosis present

## 2012-11-27 LAB — CBC WITH DIFFERENTIAL/PLATELET
Basophils Absolute: 0 10*3/uL (ref 0.0–0.1)
Basophils Relative: 0 % (ref 0–1)
Eosinophils Absolute: 0 10*3/uL (ref 0.0–0.7)
Hemoglobin: 9.2 g/dL — ABNORMAL LOW (ref 12.0–15.0)
Lymphocytes Relative: 54 % — ABNORMAL HIGH (ref 12–46)
MCHC: 34.5 g/dL (ref 30.0–36.0)
Monocytes Absolute: 0.7 10*3/uL (ref 0.1–1.0)
Monocytes Relative: 28 % — ABNORMAL HIGH (ref 3–12)
Neutro Abs: 0.5 10*3/uL — ABNORMAL LOW (ref 1.7–7.7)
Neutrophils Relative %: 18 % — ABNORMAL LOW (ref 43–77)
RDW: 14 % (ref 11.5–15.5)
WBC: 2.6 10*3/uL — ABNORMAL LOW (ref 4.0–10.5)

## 2012-11-27 LAB — PATHOLOGIST SMEAR REVIEW

## 2012-11-27 MED ORDER — HYDROCOD POLST-CHLORPHEN POLST 10-8 MG/5ML PO LQCR: 5.0000 mL | Freq: Two times a day (BID) | ORAL | Status: DC | PRN

## 2012-11-27 MED ORDER — POLYETHYLENE GLYCOL 3350 17 G PO PACK: 17.0000 g | PACK | Freq: Every day | ORAL | Status: DC

## 2012-11-27 NOTE — Progress Notes (Signed)
ANTIBIOTIC CONSULT NOTE - FOLLOW UP  Pharmacy Consult for Cefepime Indication: Febrile neutropenia  Allergies  Allergen Reactions  . Ivp Dye [Iodinated Diagnostic Agents] Hives  . Milk-Related Compounds Other (See Comments)    Stomach pains, gas, bloating  . Sulfa Antibiotics Hives    Patient Measurements: Height: 5\' 6"  (167.6 cm) Weight: 164 lb 7.4 oz (74.6 kg) IBW/kg (Calculated) : 59.3 Adjusted Body Weight:   Vital Signs: Temp: 98.8 F (37.1 C) (10/13 0517) Temp src: Oral (10/13 0517) BP: 121/60 mmHg (10/13 0517) Pulse Rate: 74 (10/13 0517) Intake/Output from previous day: 10/12 0701 - 10/13 0700 In: 1430 [P.O.:600; I.V.:160; IV Piggyback:670] Out: -  Intake/Output from this shift:    Labs:  Recent Labs  11/24/12 1015 11/25/12 0645  WBC 1.7* 2.2*  HGB 9.5* 10.2*  PLT 104* 131*  CREATININE 0.83 0.68   Estimated Creatinine Clearance: 69.5 ml/min (by C-G formula based on Cr of 0.68). No results found for this basename: VANCOTROUGH, VANCOPEAK, VANCORANDOM, GENTTROUGH, GENTPEAK, GENTRANDOM, TOBRATROUGH, TOBRAPEAK, TOBRARND, AMIKACINPEAK, AMIKACINTROU, AMIKACIN,  in the last 72 hours   Microbiology: Recent Results (from the past 720 hour(s))  CULTURE, BLOOD (ROUTINE X 2)     Status: None   Collection Time    11/24/12 10:15 AM      Result Value Range Status   Specimen Description BLOOD LEFT ANTECUBITAL   Final   Special Requests BOTTLES DRAWN AEROBIC ONLY 10CC   Final   Culture  Setup Time     Final   Value: 11/24/2012 16:15     Performed at [facility]   Culture     Final   Value:        BLOOD CULTURE RECEIVED NO GROWTH TO DATE CULTURE WILL BE HELD FOR 5 DAYS BEFORE ISSUING A FINAL NEGATIVE REPORT     Performed at [facility]   Report Status PENDING   Incomplete  CULTURE, BLOOD (ROUTINE X 2)     Status: None   Collection Time    11/24/12 10:20 AM      Result Value Range Status   Specimen Description BLOOD ARM LEFT   Final   Special  Requests BOTTLES DRAWN AEROBIC ONLY 10CC   Final   Culture  Setup Time     Final   Value: 11/24/2012 16:15     Performed at [facility]   Culture     Final   Value:        BLOOD CULTURE RECEIVED NO GROWTH TO DATE CULTURE WILL BE HELD FOR 5 DAYS BEFORE ISSUING A FINAL NEGATIVE REPORT     Performed at [facility]   Report Status PENDING   Incomplete  URINE CULTURE     Status: None   Collection Time    11/24/12 10:55 AM      Result Value Range Status   Specimen Description URINE, CLEAN CATCH   Final   Special Requests NONE   Final   Culture  Setup Time     Final   Value: 11/24/2012 11:54     Performed at [facility]     Final   Value: NO GROWTH     Performed at [facility]   Culture     Final   Value: NO GROWTH     Performed at [facility]   Report Status 11/25/2012 FINAL   Final    Anti-infectives   Start     Dose/Rate Route  Frequency Ordered Stop   11/24/12 1300  ceFEPIme (MAXIPIME) 2 g in dextrose 5 % 50 mL IVPB     2 g 100 mL/hr over 30 Minutes Intravenous Every 8 hours 11/24/12 1238        Assessment: CC: fever  PMH: HTN, asthma, breast ca  AC: None PTA, Lovenox 40mg /d  ID: Cefepime D#4 for febrile neutropenia likely precipitated by viral URI- Afebrile, WBC 2.2, good renal fxn - s/p chemo 2 weeks ago for breast ca, cultures pending, influenza neg. Coughing. ANC<100  Cefepime 10/10>>  10/10 Blood - pending 10/10 Urine - NEG  CV: Hx HTN - VSS - no meds  Endo: No hx - BMET gluc ok  GI/Nutr: Cardiac diet - LFTs WNL  Neuro: WNL  Renal: Scr 0.68, K 3.6, other lytes ok  Pulm: Hx asthma  Heme/Onc: H/H 10.2/29.6, plts 131. Breast cancer s/p chemo at [facility].  Best practices: lovenox  Plan: 1. Continue Cefepime 2gm IV Q8H. Dose ok 2. F/u renal fxn, C&S, clinical status  3. F/u med rec  Kathryn [name], PharmD, BCPS Clinical Staff Pharmacist Pager [phone]  Kathryn Stanley  [name] 11/27/2012,9:21 AM

## 2012-11-27 NOTE — Discharge Summary (Signed)
Name: Kathryn [name] MRN: [id number] DOB: 1945/05/07 67 y.o. PCP: Pcp Not In System [facility] Scheryl Marten  Date of Admission: 11/24/2012  9:36 AM Date of Discharge: 11/27/2012 Attending Physician: Cliffton Asters, MD  Discharge Diagnosis: Principal Problem:   Fever and neutropenia   Viral upper respiratory infection Active Problems:   Hypertension   Breast cancer   COPD (chronic obstructive pulmonary disease)   AAA (abdominal aortic aneurysm)   Depression   Insomnia   GERD  Discharge Medications:   Medication List         albuterol 108 (90 BASE) MCG/ACT inhaler  Commonly known as:  PROVENTIL HFA;VENTOLIN HFA  Inhale 2 puffs into the lungs every 6 (six) hours as needed for wheezing.     chlorpheniramine-HYDROcodone 10-8 MG/5ML Lqcr  Commonly known as:  TUSSIONEX  Take 5 mLs by mouth every 12 (twelve) hours as needed.     dexamethasone 4 MG tablet  Commonly known as:  DECADRON  Take 8 mg by mouth daily. Take 2 tablets daily for 3 consecutive days to prevent nausea and vomiting     diltiazem 360 MG 24 hr capsule  Commonly known as:  CARDIZEM CD  Take 360 mg by mouth daily.     docusate sodium 100 MG capsule  Commonly known as:  COLACE  Take 200 mg by mouth 2 (two) times daily as needed for constipation.     FLUoxetine 20 MG capsule  Commonly known as:  PROZAC  Take 20 mg by mouth daily.     fluticasone 50 MCG/ACT nasal spray  Commonly known as:  FLONASE  Place 1 spray into the nose 2 (two) times daily.     hydrochlorothiazide 25 MG tablet  Commonly known as:  HYDRODIURIL  Take 25 mg by mouth daily.     hydrOXYzine 25 MG tablet  Commonly known as:  ATARAX/VISTARIL  Take 25 mg by mouth every 8 (eight) hours as needed for anxiety.     methocarbamol 500 MG tablet  Commonly known as:  ROBAXIN  Take 500 mg by mouth daily as needed (muscle spasms).     omeprazole 20 MG capsule  Commonly known as:  PRILOSEC  Take 20 mg by mouth 2 (two) times daily.     ondansetron 8 MG tablet  Commonly known as:  ZOFRAN  Take 4 mg by mouth every 12 (twelve) hours as needed for nausea.     polyethylene glycol packet  Commonly known as:  MIRALAX / GLYCOLAX  Take 17 g by mouth daily.     potassium chloride SA 20 MEQ tablet  Commonly known as:  K-DUR,KLOR-CON  Take 40 mEq by mouth 2 (two) times daily.     PRESCRIPTION MEDICATION  Inject 1 each into the vein every Wednesday. Chemo medication every Wednesday from [facility]     prochlorperazine 5 MG tablet  Commonly known as:  COMPAZINE  Take 10 mg by mouth every 4 (four) hours as needed for nausea.     senna 8.6 MG tablet  Commonly known as:  SENOKOT  Take 2 tablets by mouth 2 (two) times daily.     zolpidem 10 MG tablet  Commonly known as:  AMBIEN  Take 10 mg by mouth at bedtime as needed for sleep.        Disposition and follow-up:   Kathryn [name] was discharged from [facility] in Stable condition.  At the hospital follow up visit please address:  1.  Continued resolution of  fever, neutropenia, and ability to tolerate future chemotherapeutic treatment   2.  Labs / imaging needed at time of follow-up: CBC  3.  Pending labs/ test needing follow-up: None  Follow-up Appointments:     Follow-up Information   Follow up with [facility], MD On 11/29/2012. (Keep scheduled appt, discuss recent hospitalization)    Specialty:  General Practice   Contact information:   [address]  [facility] [place] [address] [phone]       Discharge Instructions: Discharge Orders   Future Orders Complete By Expires   Call MD for:  difficulty breathing, headache or visual disturbances  As directed    Call MD for:  redness, tenderness, or signs of infection (pain, swelling, redness, odor or green/yellow discharge around incision site)  As directed    Call MD for:  temperature >100.4  As directed    Diet - low sodium heart healthy  As directed    Discharge instructions  As  directed    Comments:     Follow up with Dr. Ardine Eng this week.  Discuss recent hospitalization and whether you are taking or need Neulasta.      Consultations:    Procedures Performed:  Dg Chest 2 View  11/24/2012   CLINICAL DATA:  Fever.  EXAM: CHEST  2 VIEW  COMPARISON:  None.  FINDINGS: Cardiac silhouette is normal. Tortuosity of the thoracic aorta is noted. No focal infiltrate or edema. No pleural effusion or pneumothorax. No acute osseous abnormality.  IMPRESSION: No active cardiopulmonary disease.   Electronically Signed   By: Jerene Dilling M.D.   On: 11/24/2012 10:42    Admission HPI: Patient is 67 year old lady with past medical history of breast cancer (ongoing chemotherapy), hypertension, COPD, depression, AAA, who presents with fever and neutropenia.  Patient reports that she was initially diagnosed with left breast cancer 5 years ago. She received lumpectomy and radiation therapy. She has been doing well after treatment. Unfortunately, she was found to have a mass on the right breast 03/2012 by ultrasound. She received right breast mastectomy 06/2012, then started weekly chemotherapy on 10/25/12 in [facility] hospital. Since this Wednesday, patient started having nasal congestion, sore throat, dry cough and muffled ear. She did not receive chemotherapy this Wednesday. She did not have chest pain, shortness of breath, fever initially. This morning she started having fever and chills. Her temperature was 101.0 in this morning. She was told to not take Tylenol by her doctor if she develops a fever. She did not take any medications at home and came to ED for further evaluation.  Her temperature was 99.3 ED. She still has dry cough, sore throat, muffled ears, but no shortness of breath and chest pain. Her blood pressure was 123/66 and a heart rate 88/minute when I saw patient in ED. She had a negative urinalysis for UTI and a negative chest x-ray for infiltration. She was found to have a  neutrophil count of102. We were called to admit patient for neutropenia and fever.    Admission Physical Exam:  Filed Vitals:    11/24/12 0944  11/24/12 1115  11/24/12 1200  11/24/12 1333   BP:   123/66  109/75  127/69   Pulse:   87  92  92   Temp:     99.2 F (37.3 C)   TempSrc:       Resp:     20   Height:  5\' 6"  (1.676 m)  Weight:  164 lb (74.39 kg)      SpO2:   99%  98%  100%    General: Not in acute distress.  HEENT: PERRL, EOMI, no scleral icterus, No JVD or bruit  Cardiac: S1/S2, RRR, No murmurs, gallops or rubs  Pulm: Good air movement bilaterally. Clear to auscultation bilaterally. No rales, wheezing, rhonchi or rubs.  Abd: Soft, nondistended, nontender, no rebound pain, no organomegaly, BS present. No bruit  BacK: There is a mass over the right upper back, approximately 5 x 6 cm in size. The mass is soft with clear margin, mildly tender. There is no warmth or redness over the mass.  Ext: No edema. 2+DP/PT pulse bilaterally  Musculoskeletal: No joint deformities, erythema, or stiffness, ROM full  Skin: No rashes.  Neuro: Alert and oriented X3, cranial nerves II-XII grossly intact, muscle strength 5/5 in all extremeties, sensation to light touch intact. Brachial reflex 2+ bilaterally.  Psych: Patient is not psychotic, no suicidal or hemocidal ideation.   Hospital Course by problem list:   #1  Fever and neutropenia secondary to viral upper respiratory infection: Kathryn [name] was admitted with a reported Temperature of T 101, nasal congestion, sore throat, and nonproductive cough in setting of recent chemotherapy (last session ~ 1-2 weeks prior to presentation).  There were no identified source of infection during her hospitalization including negative urine cultures, blood cultures, chest XRay without acute findings. In addition, influenza and H1N1 testing were negative. She had a Tmax of 100.1 while hospitalized which responded to Tylenol. Otherwise her vital signs remained  stable and her symptoms improved during her hospitalization. We attributed the source of her fever and decreased absolute neutrophil count to a viral URI. Her absolute neutrophil count was 102 on admission and had improved to 468 by Hospital Day #3 when she was discharged. She received 3 days of broad empiric antibiotic coverage with IV cefepime. Per Infectious Disease Society of [place] guidelines, patients who achieve myeloid reconstitution (defined as Absolute Neutrophil Count >500) after an episode of neutropenic fever no longer warrant antibiotics thus she was discharged with close follow-up with her Oncologist Dr. Ardine Eng without outpatient antibiotics.  #2 Breast cancer: Kathryn [name] was initially diagnosed with left breast cancer 5 years ago for which she received lumpectomy and radiation therapy. In Feb 2014, she was discovered to have a mass in her R breast. She had a R mastectomy in May 2014 and was started on weekly chemotherapy on 10/25/12 at the [facility]. Her chemotherapy was held the week prior to admission because of recent upper respiratory symptoms and neutropenia per the patient. Her specific chemotherapy regimen was not known during her hospitalization secondary to the closing of the Oncology Depart at [facility] for the weekend and subsequent [address].  #3 Chronic Obstructive Pulmonary Disease without exacerbation: She remained asymptomatic and her lungs were clear to auscultation bilaterally without need for prn albuterol.  #4 Hypertension: She remained normotensive and asymptomatic while hospitalized without continuation of her HCTZ.  #5 Abdominal Aortic Aneurysm (AAA): Kathryn [name] reported that she was diagnosed with an AAA several years ago with an ultrasound this year showing a size of 3.0 cm. She remained asymptomatic and her blood pressures remained stable.  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 Her oncologist, Dr. Ardine Eng is aware of it and plans to complete further workup after she  completes chemotherapy thus we deferred any further workup.   #7 Gastroesophageal Reflux Disease (GERD): no exacerbation of reflux symptoms during hospital stay.  #8 Insomnia: remained stable on Ambien therapy which she uses as an outpatient.  #9 Depression: mood and affect were normal during this hospitalization. She resumed her home regimen of fluoxetine on day of discharge once we received medicine reconciliation from the [facility].  Discharge Vitals:   BP 121/60  Pulse 74  Temp(Src) 98.8 F (37.1 C) (Oral)  Resp 16  Ht 5\' 6"  (1.676 m)  Wt 164 lb 7.4 oz (74.6 kg)  BMI 26.56 kg/m2  SpO2 98%  Discharge Labs:  Results for orders placed during the hospital encounter of 11/24/12 (from the past 24 hour(s))  CBC WITH DIFFERENTIAL     Status: Abnormal   Collection Time    11/27/12  9:22 AM      Result Value Range   WBC 2.6 (*) 4.0 - 10.5 K/uL   RBC 3.49 (*) 3.87 - 5.11 MIL/uL   Hemoglobin 9.2 (*) 12.0 - 15.0 g/dL   HCT 16.1 (*) 09.6 - 04.5 %   MCV 76.5 (*) 78.0 - 100.0 fL   MCH 26.4  26.0 - 34.0 pg   MCHC 34.5  30.0 - 36.0 g/dL   RDW 40.9  81.1 - 91.4 %   Platelets 144 (*) 150 - 400 K/uL   Neutrophils Relative % 18 (*) 43 - 77 %   Lymphocytes Relative 54 (*) 12 - 46 %   Monocytes Relative 28 (*) 3 - 12 %   Eosinophils Relative 0  0 - 5 %   Basophils Relative 0  0 - 1 %   Neutro Abs 0.5 (*) 1.7 - 7.7 K/uL   Lymphs Abs 1.4  0.7 - 4.0 K/uL   Monocytes Absolute 0.7  0.1 - 1.0 K/uL   Eosinophils Absolute 0.0  0.0 - 0.7 K/uL   Basophils Absolute 0.0  0.0 - 0.1 K/uL   RBC Morphology POLYCHROMASIA PRESENT      Signed: Manuela Schwartz, MD 11/27/2012, 11:45 AM   Time Spent on Discharge: >35 minutes Services Ordered on Discharge: None Equipment Ordered on Discharge: None

## 2012-11-27 NOTE — Progress Notes (Signed)
Subjective: [name] reports a dry cough this morning, which she has had for about 1 week. She will try Tussinex today. Her nasal congestion and sore throat have improved. Her constipation has resolved with miralax and colace.  She states that she was instructed to take 3 doses of a medication after her chemo treatments to help her blood counts but she took it incorrectly last time. When she went to get chemo last Wednesday, her blood counts were too low so treatment was withheld. She is scheduled again to get chemo this Wednesday. She states she feels well enough to go home today.   Objective: Vital signs in last 24 hours: Filed Vitals:   11/26/12 1300 11/26/12 2125 11/26/12 2221 11/27/12 0517  BP: 136/72 133/78  121/60  Pulse: 78 87  74  Temp: 99.1 F (37.3 C) 99.4 F (37.4 C) 98.9 F (37.2 C) 98.8 F (37.1 C)  TempSrc: Oral Oral Oral Oral  Resp: 20 16  16   Height:      Weight:    74.6 kg (164 lb 7.4 oz)  SpO2: 100% 98%  98%   Weight change: -0.3 kg (-10.6 oz)  Intake/Output Summary (Last 24 hours) at 11/27/12 1112 Last data filed at 11/27/12 1038  Gross per 24 hour  Intake   1433 ml  Output      0 ml  Net   1433 ml    Physical Exam  Constitutional: She is well-developed, well-nourished, and in no distress. Vital signs are normal.  HENT:  Mouth/Throat: Oropharynx is clear and moist and mucous membranes are normal. No oropharyngeal exudate.  Neck: Normal range of motion.  Cardiovascular: Normal rate, regular rhythm, S1 normal, S2 normal and normal heart sounds.  Pulmonary/Chest: Effort normal and breath sounds normal. No respiratory distress. She has no wheezes. She has no rales.  Abdominal: Soft. Bowel sounds are normal.  Skin: Skin is warm, dry and intact.   Lab Results: Basic Metabolic Panel:  Recent Labs Lab 11/24/12 1015 11/25/12 0645  NA 141 142  K 3.1* 3.6  CL 104 107  CO2 27 26  GLUCOSE 95 88  BUN 7 9  CREATININE 0.83 0.68  CALCIUM 8.1* 8.4    CBC:  Recent Labs Lab 11/25/12 0645 11/27/12 0922  WBC 2.2* 2.6*  NEUTROABS 0.1* 0.5*  HGB 10.2* 9.2*  HCT 29.6* 26.7*  MCV 76.9* 76.5*  PLT 131* 144*   Micro Results: Recent Results (from the past 240 hour(s))  CULTURE, BLOOD (ROUTINE X 2)     Status: None   Collection Time    11/24/12 10:15 AM      Result Value Range Status   Specimen Description BLOOD LEFT ANTECUBITAL   Final   Special Requests BOTTLES DRAWN AEROBIC ONLY 10CC   Final   Culture  Setup Time     Final   Value: 11/24/2012 16:15     Performed at [facility]   Culture     Final   Value:        BLOOD CULTURE RECEIVED NO GROWTH TO DATE CULTURE WILL BE HELD FOR 5 DAYS BEFORE ISSUING A FINAL NEGATIVE REPORT     Performed at [facility]   Report Status PENDING   Incomplete  CULTURE, BLOOD (ROUTINE X 2)     Status: None   Collection Time    11/24/12 10:20 AM      Result Value Range Status   Specimen Description BLOOD ARM LEFT   Final  Special Requests BOTTLES DRAWN AEROBIC ONLY 10CC   Final   Culture  Setup Time     Final   Value: 11/24/2012 16:15     Performed at [facility]   Culture     Final   Value:        BLOOD CULTURE RECEIVED NO GROWTH TO DATE CULTURE WILL BE HELD FOR 5 DAYS BEFORE ISSUING A FINAL NEGATIVE REPORT     Performed at [facility]   Report Status PENDING   Incomplete  URINE CULTURE     Status: None   Collection Time    11/24/12 10:55 AM      Result Value Range Status   Specimen Description URINE, CLEAN CATCH   Final   Special Requests NONE   Final   Culture  Setup Time     Final   Value: 11/24/2012 11:54     Performed at [facility] Count     Final   Value: NO GROWTH     Performed at [facility]   Culture     Final   Value: NO GROWTH     Performed at [facility]   Report Status 11/25/2012 FINAL   Final   Medications: I have reviewed the patient's current medications. Scheduled Meds: . ceFEPime (MAXIPIME)  IV  2 g Intravenous Q8H  . enoxaparin (LOVENOX) injection  40 mg Subcutaneous Q24H  . feeding supplement (ENSURE COMPLETE)  237 mL Oral Q24H  . polyethylene glycol  17 g Oral Daily  . sodium chloride  3 mL Intravenous Q12H   Continuous Infusions:  PRN Meds:.sodium chloride, acetaminophen, acetaminophen, albuterol, benzonatate, chlorpheniramine-HYDROcodone, docusate sodium, sodium chloride, zolpidem  Assessment/Plan: This is a 67 yo woman with h/o breast cancer (ongoing chemotherapy) who was admitted with reported fever to 101 and neutropenia with ANC of 102 on admission.   # Fever and neutropenia: Kathryn [name] continues to improve. The etiology of her fever still appears to be an upper respiratory viral infection, given her subjective symptoms of nasal congestion, sore throat, and nonproductive cough on admission. These symptoms are improving. There are no alternative s/s of infection indicated by her physical exam or labs. Final urine culture was negative and blood cultures are NGTD. She has remained afebrile for over 48 hours and her vital signs have remained stable. Her absolute neutrophil count on admission was 102. On HD1 it was 44 and today it is 468. Today is day 3 of treatment with IV cefepime. Per IDSA guidelines, treatment can be discontinued once the ANC is >500. Since Kathryn [name]'s ANC is very close to that number today, she is clinically stable and looks well, her cultures have been negative, and she is a low risk and reliable patient with good follow-up we will plan to discharge her today. Per IDSA guidelines, she does not warrant outpatient antibiotics since she has achieved myeloid reconstitution (defined as ANC >500).   - continue cefepime 2g IV q8hr per pharmacy until discharge  [facility] is aware of Kathryn [name]'s hospitalization. They will call the patient on Monday to schedule a hospital follow-up appointment. Kathryn [name] has an appointment on Wednesday with her oncologist to  receive her chemo treatment.   # HTN: Stable. See previous note.  # Constipation: Resolved with Colace and Miralax.   # Breast cancer: Stable. See admission H&P.   # COPD: Stable. See admission H&P.   # AAA: Stable. See admission H&P.   #  Mass on the right upper back: Stable. See admission H&P.   # DVT px: Lovenox sq   Dispo: Anticipated discharge today.  The patient does have a current PCP (Pcp Not In System), therefore will be requiring PCP follow-up after discharge.  The patient does have transportation limitations that hinder transportation to clinic appointments.   This is a Psychologist, occupational Note.  The care of the patient was discussed with Dr. Orvan Falconer and Dr. Bosie Clos and the assessment and plan formulated with their assistance.  Please see their attached note for official documentation of the daily encounter.   LOS: 3 days   Kerrie Pleasure, Med Student 11/27/2012, 11:12 AM  Attending addendum: She is feeling better today and has been afebrile since admission. Blood cultures remained negative and her absolute neutrophil count is now 500 climbing. I agree with stopping empiric cefepime and discharging her home with [facility] days.  Cliffton Asters, MD [facility] for Infectious Disease [facility] [phone] pager   [phone] cell 11/27/2012, 1:56 PM

## 2012-11-27 NOTE — Progress Notes (Signed)
Discharge instructions reviewed with patient. And med student present. Questions answered and patient verbalizes understanding. Discharge instructions and prescription given to patient. Patient d/c'd to home. NT to accompany downstairs.

## 2012-11-29 DIAGNOSIS — J069 Acute upper respiratory infection, unspecified: Secondary | ICD-10-CM | POA: Diagnosis present

## 2012-11-30 LAB — CULTURE, BLOOD (ROUTINE X 2)

## 2013-06-14 ENCOUNTER — Encounter: Payer: Self-pay | Admitting: Neurology

## 2013-06-14 ENCOUNTER — Ambulatory Visit (INDEPENDENT_AMBULATORY_CARE_PROVIDER_SITE_OTHER): Payer: Non-veteran care | Admitting: Neurology

## 2013-06-14 VITALS — BP 156/96 | HR 72 | Temp 98.4°F | Ht 66.0 in | Wt 155.0 lb

## 2013-06-14 DIAGNOSIS — C50919 Malignant neoplasm of unspecified site of unspecified female breast: Secondary | ICD-10-CM

## 2013-06-14 DIAGNOSIS — R296 Repeated falls: Secondary | ICD-10-CM

## 2013-06-14 DIAGNOSIS — Z9181 History of falling: Secondary | ICD-10-CM

## 2013-06-14 DIAGNOSIS — H93A3 Pulsatile tinnitus, bilateral: Secondary | ICD-10-CM

## 2013-06-14 DIAGNOSIS — H9319 Tinnitus, unspecified ear: Secondary | ICD-10-CM

## 2013-06-14 DIAGNOSIS — R519 Headache, unspecified: Secondary | ICD-10-CM

## 2013-06-14 DIAGNOSIS — R42 Dizziness and giddiness: Secondary | ICD-10-CM

## 2013-06-14 DIAGNOSIS — R51 Headache: Secondary | ICD-10-CM

## 2013-06-14 NOTE — Progress Notes (Signed)
Subjective:    Patient ID: Kathryn [name] is a 68 y.o. female.  HPI    Star Age, MD, PhD [facility] [address] [place], [place] [address]  Dear Dr. Hosie Poisson,   I saw your patient, Kathryn [name], upon your kind request in my neurologic clinic today for initial consultation of her gait disorder, balance problems and recurrent falls. The patient is unaccompanied by today. As you know, Kathryn [name] is a 68 year old right-handed woman with an underlying medical history breast cancer (status post mastectomy on the R and lumpectomy on the L, currently in chemotherapy, she has a port in place), obesity, COPD, AAA, depression and PTSD, hypertension and asthma, who reports an approximately 3-4 year history of balance issues and recurrent falls. She reported never having had workup for this. 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  I reviewed [facility] records. Thank you for including these.  Symptoms started gradually and have been progressive. She has dizziness, but denies vertigo. She has seen a cardiologist, but never an ENT. Symptoms are worse when she stands, but can happen while sitting. She used to have Sx off and on, but feels, in the last 6-8 months, Sx are more constant. In the past few months, she has noted a pulsatile tinnitus in both hears. She has has had some L arm tiredness and pain in the past few weeks.  She fell and injured her R shoulder some 4 months ago, and she broke her L wrist 8-9 months ago. She falls without warning, and falls on the sides. She has not used a cane.  Never had TIA or stroke symptoms, denying sudden onset of one sided weakness, numbness, tingling, slurring of speech or droopy face, hearing loss, tinnitus, diplopia or visual field cut or monocular loss of vision, but reports a daily HA for the past 4+ years.  She denies snores, or has not been told and has no Hx of apneas and denies  waking up gasping.    Her Past Medical History Is Significant For: Past Medical History  Diagnosis Date  . Hypertension   . Asthma     Her Past Surgical History Is Significant For: Past Surgical History  Procedure Laterality Date  . Abdominal hysterectomy    . Cholecystectomy    . Breast surgery    . Rotator cuff repair      Her Family History Is Significant For: Family History  Problem Relation Age of Onset  . Heart failure Father   . Cancer Mother   . Kidney failure Brother   . Hypertension Brother   . Heart disease Sister   . Stroke Sister   . Stroke Sister     Her Social History Is Significant For: History   Social History  . Marital Status: Divorced    Spouse Name: N/A    Number of Children: N/A  . Years of Education: N/A   Social History Main Topics  . Smoking status: Former Smoker    Quit date: 06/04/2012  . Smokeless tobacco: Never Used  . Alcohol Use: No  . Drug Use: No  . Sexual Activity: None   Other Topics Concern  . None   Social History Narrative  . None    Her Allergies Are:  Allergies  Allergen Reactions  . Ivp Dye [Iodinated Diagnostic Agents] Hives  . Lisinopril Cough  . Lortab [Hydrocodone-Acetaminophen] Itching  . Milk-Related  Compounds Other (See Comments)    Stomach pains, gas, bloating  . Sodium Hypochlorite Other (See Comments)    wheezing  . Sulfa Antibiotics Hives  . Adhesive [Tape] Rash  :   Her Current Medications Are:  Outpatient Encounter Prescriptions as of 06/14/2013  Medication Sig  . albuterol (PROVENTIL HFA;VENTOLIN HFA) 108 (90 BASE) MCG/ACT inhaler Inhale 2 puffs into the lungs every 6 (six) hours as needed for wheezing.  . chlorpheniramine-HYDROcodone (TUSSIONEX) 10-8 MG/5ML LQCR Take 5 mLs by mouth every 12 (twelve) hours as needed.  Marland Kitchen dexamethasone (DECADRON) 4 MG tablet Take 8 mg by mouth See admin instructions. Take 2 tablets (8 mg) daily for 3 consecutive days following chemo to prevent nausea and  vomiting  . diltiazem (CARDIZEM CD) 360 MG 24 hr capsule Take 360 mg by mouth daily.  Marland Kitchen docusate sodium (COLACE) 100 MG capsule Take 200 mg by mouth 2 (two) times daily as needed for constipation.  Marland Kitchen FLUoxetine (PROZAC) 20 MG capsule Take 20 mg by mouth daily.  . fluticasone (FLONASE) 50 MCG/ACT nasal spray Place 1 spray into the nose 2 (two) times daily.  . hydrochlorothiazide (HYDRODIURIL) 25 MG tablet Take 25 mg by mouth daily.  . hydrOXYzine (ATARAX/VISTARIL) 25 MG tablet Take 25 mg by mouth every 8 (eight) hours as needed for anxiety.  . methocarbamol (ROBAXIN) 500 MG tablet Take 500 mg by mouth daily as needed (muscle spasms).  Marland Kitchen omeprazole (PRILOSEC) 20 MG capsule Take 20 mg by mouth 2 (two) times daily.  . ondansetron (ZOFRAN) 8 MG tablet Take 4 mg by mouth every 12 (twelve) hours as needed for nausea.  . polyethylene glycol (MIRALAX / GLYCOLAX) packet Take 17 g by mouth daily.  . potassium chloride SA (K-DUR,KLOR-CON) 20 MEQ tablet Take 40 mEq by mouth 2 (two) times daily.  . prochlorperazine (COMPAZINE) 5 MG tablet Take 10 mg by mouth every 4 (four) hours as needed for nausea.  Marland Kitchen senna (SENOKOT) 8.6 MG tablet Take 2 tablets by mouth 2 (two) times daily.  Marland Kitchen zolpidem (AMBIEN) 10 MG tablet Take 5 mg by mouth at bedtime as needed for sleep.  : Review of Systems:  Out of a complete 14 point review of systems, all are reviewed and negative with the exception of these symptoms as listed below:   Review of Systems  Constitutional: Positive for activity change (disinterest) and unexpected weight change (loss).  HENT: Positive for tinnitus.   Eyes: Negative.   Respiratory: Positive for wheezing.   Cardiovascular: Positive for palpitations.  Gastrointestinal: Positive for constipation.  Endocrine: Negative.   Genitourinary: Negative.   Musculoskeletal: Positive for arthralgias.  Skin: Negative.   Allergic/Immunologic: Positive for environmental allergies.  Neurological: Positive for  dizziness, numbness and headaches.  Hematological: Negative.   Psychiatric/Behavioral: Positive for sleep disturbance (insomnia) and dysphoric mood.    Objective:  Neurologic Exam  Physical Exam Physical Examination:   Filed Vitals:   06/14/13 1338  BP: 156/96  Pulse: 72  Temp: 98.4 F (36.9 C)   Repeat sitting BP and Pulse: 148/94, 74, standing: 143/91, 81   General Examination: The patient is a very pleasant 68 y.o. female in no acute distress. She appears well-developed and well-nourished and well groomed.   HEENT: Normocephalic, atraumatic, pupils are equal, round and reactive to light and accommodation. Funduscopic exam is normal with sharp disc margins noted. Extraocular tracking is good without limitation to gaze excursion or nystagmus noted. Normal smooth pursuit is noted. Hearing is grossly intact. Tympanic membranes  are clear on the left and partially obscured with cerumen on the right. Face is symmetric with normal facial animation and normal facial sensation. Speech is clear with no dysarthria noted. There is no hypophonia. There is no lip, neck/head, jaw or voice tremor. Neck is supple with full range of passive and active motion. There are no carotid bruits on auscultation. Oropharynx exam reveals: mild mouth dryness, adequate dental hygiene and mild airway crowding, due to larger tongue. Mallampati is class II. Tongue protrudes centrally and palate elevates symmetrically. Tonsils are small or absent. She has no vertiginous symptoms and sudden changes of head position.   Chest: Clear to auscultation without wheezing, rhonchi or crackles noted.  Heart: S1+S2+0, regular and normal without murmurs, rubs or gallops noted.   Abdomen: Soft, non-tender and non-distended with normal bowel sounds appreciated on auscultation.  Extremities: There is no pitting edema in the distal lower extremities bilaterally. Pedal pulses are intact.  Skin: Warm and dry without trophic changes  noted. There are no varicose veins.  Musculoskeletal: exam reveals no obvious joint deformities, tenderness or joint swelling or erythema, with the exception of decreased range of motion in the right shoulder.  Neurologically:  Mental status: The patient is awake, alert and oriented in all 4 spheres. Her immediate and remote memory, attention, language skills and fund of knowledge are appropriate. There is no evidence of aphasia, agnosia, apraxia or anomia. Speech is clear with normal prosody and enunciation. Thought process is linear. Mood is normal and affect is normal.  Cranial nerves II - XII are as described above under HEENT exam. In addition: shoulder shrug is normal with equal shoulder height noted. Motor exam: Normal bulk, strength and tone is noted. There is no drift, tremor or rebound. Romberg is negative. Reflexes are 2+ throughout. Babinski: Toes are flexor bilaterally. Fine motor skills and coordination: intact with normal finger taps, normal hand movements, normal rapid alternating patting, normal foot taps and normal foot agility.  Cerebellar testing: No dysmetria or intention tremor on finger to nose testing. Heel to shin is unremarkable bilaterally. There is no truncal or gait ataxia.  Sensory exam: intact to light touch, pinprick, vibration, temperature sense in the upper and lower extremities.  Gait, station and balance: She stands easily, but feels more dizzy. No veering to one side is noted. No leaning to one side is noted. Posture is age-appropriate and stance is slightly wide based. Gait shows mild insecurity with cautious turn, and she has mild difficulty with tandem walk and difficulty with toe and heel stance.               Assessment and Plan:   In summary, Kathryn [name] is a very pleasant 68 y.o.-year old female with an underlying medical history breast cancer (status post mastectomy on the R and lumpectomy on the L, currently in chemotherapy), obesity, COPD, AAA, depression  and PTSD, hypertension and asthma, who reports an approximately 3-4 year history of balance issues and recurrent falls. Her history and physical exam are consistent with nonspecific dizziness, nonvertiginous symptoms, cautious gait and negative for ataxia. She has no focality on exam. I explained to her that dizziness can be difficult to tease out. Multiple players have to be considered including her medications including chemotherapy agents, hydration status, blood pressure fluctuations even though she was not orthostatic today. She has recently seen cardiology but has never seen an ENT. I would recommend that she see ENT for additional clarification, especially in light of bilateral pulsatile tinnitus  reported. Given her history of cancer, daily headaches reported for the past several years and left-sided arm heaviness reported subjectively but it did not necessarily reproduce on exam, I would like to proceed with a brain MRI with and without contrast. I am not sure if she has to get this done through the [facility] [place] but I will go ahead and order it. I would like for her to have physical therapy. She says that she is going to the [place] for physical therapy of her right should recommend that she also have physical therapy for gait and balance training and should be able to get this through the [place]. She is furthermore advised to change positions slowly, always stay well-hydrated, and start using a cane for safety. I will see her back routinely in about 3 months and we should be able to call her with her MRI results. I did advise her and explained to her that unfortunately there is typically no specific medication that helps dizziness and balance. Often, we have to take the less is more approach by streamlining medications, especially by avoiding sedating medications. She will review her medications with you.   I answered all her questions today and the patient was in agreement with the above outlined plan.  Thank you very much  for allowing me to participate in the care of this nice patient. If I can be of any further assistance to you please do not hesitate to call me at [phone].  Sincerely,   Star Age, MD, PhD

## 2013-06-14 NOTE — Patient Instructions (Addendum)
I will recommend to Dr. Hosie Poisson that you see an ENT physician.  I will also recommend Physical therapy for gait and balance training.  I will order a brain MRI with and without contrast and we should be able to call you with the test results. I would like for you to start using a cane for safety.  Please remember, that dizziness can happen without warning. Please change positions slowly and always stay well-hydrated. Physical therapy can be very helpful. Certain medications can exacerbate dizziness.

## 2013-11-06 ENCOUNTER — Ambulatory Visit: Payer: Non-veteran care | Admitting: Neurology

## 2014-08-20 ENCOUNTER — Emergency Department (HOSPITAL_COMMUNITY): Payer: Medicare Other

## 2014-08-20 ENCOUNTER — Encounter (HOSPITAL_COMMUNITY): Payer: Self-pay | Admitting: Physical Medicine and Rehabilitation

## 2014-08-20 ENCOUNTER — Emergency Department (HOSPITAL_COMMUNITY)
Admission: EM | Admit: 2014-08-20 | Discharge: 2014-08-20 | Disposition: A | Discharge status: Home / Self Care | Payer: Medicare Other | Attending: Emergency Medicine | Admitting: Emergency Medicine

## 2014-08-20 DIAGNOSIS — Z7951 Long term (current) use of inhaled steroids: Secondary | ICD-10-CM | POA: Insufficient documentation

## 2014-08-20 DIAGNOSIS — Z7952 Long term (current) use of systemic steroids: Secondary | ICD-10-CM | POA: Insufficient documentation

## 2014-08-20 DIAGNOSIS — Z79899 Other long term (current) drug therapy: Secondary | ICD-10-CM | POA: Insufficient documentation

## 2014-08-20 DIAGNOSIS — R079 Chest pain, unspecified: Secondary | ICD-10-CM | POA: Insufficient documentation

## 2014-08-20 DIAGNOSIS — Z87891 Personal history of nicotine dependence: Secondary | ICD-10-CM | POA: Insufficient documentation

## 2014-08-20 DIAGNOSIS — M549 Dorsalgia, unspecified: Secondary | ICD-10-CM | POA: Diagnosis not present

## 2014-08-20 DIAGNOSIS — I10 Essential (primary) hypertension: Secondary | ICD-10-CM | POA: Insufficient documentation

## 2014-08-20 DIAGNOSIS — J45909 Unspecified asthma, uncomplicated: Secondary | ICD-10-CM | POA: Diagnosis not present

## 2014-08-20 LAB — COMPREHENSIVE METABOLIC PANEL
ALT: 13 U/L — ABNORMAL LOW (ref 14–54)
AST: 17 U/L (ref 15–41)
Albumin: 3.7 g/dL (ref 3.5–5.0)
Alkaline Phosphatase: 71 U/L (ref 38–126)
Anion gap: 6 (ref 5–15)
BUN: 12 mg/dL (ref 6–20)
CALCIUM: 9 mg/dL (ref 8.9–10.3)
CO2: 32 mmol/L (ref 22–32)
CREATININE: 0.73 mg/dL (ref 0.44–1.00)
Chloride: 102 mmol/L (ref 101–111)
GFR calc non Af Amer: >60 mL/min (ref >60)
GLUCOSE: 104 mg/dL — AB (ref 65–99)
Potassium: 3.1 mmol/L — ABNORMAL LOW (ref 3.5–5.1)
SODIUM: 140 mmol/L (ref 135–145)
TOTAL PROTEIN: 6.9 g/dL (ref 6.5–8.1)
Total Bilirubin: 0.4 mg/dL (ref 0.3–1.2)

## 2014-08-20 LAB — CBC WITH DIFFERENTIAL/PLATELET
Basophils Absolute: 0 10*3/uL (ref 0.0–0.1)
Basophils Relative: 0 % (ref 0–1)
EOS ABS: 0 10*3/uL (ref 0.0–0.7)
EOS PCT: 1 % (ref 0–5)
HCT: 37.9 % (ref 36.0–46.0)
HEMOGLOBIN: 12.3 g/dL (ref 12.0–15.0)
LYMPHS PCT: 27 % (ref 12–46)
Lymphs Abs: 1.7 10*3/uL (ref 0.7–4.0)
MCH: 26.2 pg (ref 26.0–34.0)
MCHC: 32.5 g/dL (ref 30.0–36.0)
MCV: 80.8 fL (ref 78.0–100.0)
MONO ABS: 0.3 10*3/uL (ref 0.1–1.0)
MONOS PCT: 5 % (ref 3–12)
Neutro Abs: 4.1 10*3/uL (ref 1.7–7.7)
Neutrophils Relative %: 67 % (ref 43–77)
Platelets: 158 10*3/uL (ref 150–400)
RBC: 4.69 MIL/uL (ref 3.87–5.11)
RDW: 13.5 % (ref 11.5–15.5)
WBC: 6.1 10*3/uL (ref 4.0–10.5)

## 2014-08-20 LAB — I-STAT TROPONIN, ED: Troponin i, poc: 0 ng/mL (ref 0.00–0.08)

## 2014-08-20 NOTE — Discharge Instructions (Signed)

## 2014-08-20 NOTE — ED Notes (Signed)
Pt presents to department for evaluation of diffuse chest pain radiating to back. Ongoing x2 days. 7/10 pain upon arrival. Respirations unlabored. Pt is alert and oriented x4.

## 2014-08-20 NOTE — ED Provider Notes (Signed)
CSN: [id number]     Arrival date & time 08/20/14  1200 History   First MD Initiated Contact with Patient 08/20/14 1410     Chief Complaint  Patient presents with  . Chest Pain     (Consider location/radiation/quality/duration/timing/severity/associated sxs/prior Treatment) Patient is a 69 y.o. female presenting with chest pain. The history is provided by the patient.  Chest Pain Associated symptoms: back pain   Associated symptoms: no abdominal pain, no headache, no nausea, no numbness, not vomiting and no weakness    patient with chest pain. She's had for the last 2 weeks. Is in her upper chest. Worse with certain movements. She states deep breaths make it feel better. Somewhat worse with lying down. No cough. No fevers. No swelling or legs. No known cardiac history. She does not smoke but has a history of hypertension.  Past Medical History  Diagnosis Date  . Hypertension   . Asthma    Past Surgical History  Procedure Laterality Date  . Abdominal hysterectomy    . Cholecystectomy    . Breast surgery    . Rotator cuff repair     Family History  Problem Relation Age of Onset  . Heart failure Father   . Cancer Mother   . Kidney failure Brother   . Hypertension Brother   . Heart disease Sister   . Stroke Sister   . Stroke Sister    History  Substance Use Topics  . Smoking status: Former Smoker    Quit date: 06/04/2012  . Smokeless tobacco: Never Used  . Alcohol Use: No   OB History    No data available     Review of Systems  Constitutional: Negative for activity change and appetite change.  Eyes: Negative for pain.  Respiratory: Negative for chest tightness.   Cardiovascular: Positive for chest pain. Negative for leg swelling.  Gastrointestinal: Negative for nausea, vomiting, abdominal pain and diarrhea.  Genitourinary: Negative for flank pain.  Musculoskeletal: Positive for back pain. Negative for arthralgias and neck stiffness.  Skin: Negative for rash.   Neurological: Negative for weakness, numbness and headaches.  Psychiatric/Behavioral: Negative for behavioral problems.      Allergies  Ivp dye; Lisinopril; Lortab; Milk-related compounds; Sodium hypochlorite; Sulfa antibiotics; and Adhesive  Home Medications   Prior to Admission medications   Medication Sig Start Date End Date Taking? Authorizing Provider  diltiazem (CARDIZEM CD) 360 MG 24 hr capsule Take 360 mg by mouth daily.   Yes Historical Provider, MD  hydrochlorothiazide (HYDRODIURIL) 25 MG tablet Take 25 mg by mouth daily.   Yes Historical Provider, MD  albuterol (PROVENTIL HFA;VENTOLIN HFA) 108 (90 BASE) MCG/ACT inhaler Inhale 2 puffs into the lungs every 6 (six) hours as needed for wheezing.    Historical Provider, MD  chlorpheniramine-HYDROcodone (TUSSIONEX) 10-8 MG/5ML LQCR Take 5 mLs by mouth every 12 (twelve) hours as needed. Patient not taking: Reported on 08/20/2014 11/27/12   Dorian Heckle, MD  dexamethasone (DECADRON) 4 MG tablet Take 8 mg by mouth See admin instructions. Take 2 tablets (8 mg) daily for 3 consecutive days following chemo to prevent nausea and vomiting    Historical Provider, MD  docusate sodium (COLACE) 100 MG capsule Take 200 mg by mouth 2 (two) times daily as needed for constipation.    Historical Provider, MD  FLUoxetine (PROZAC) 20 MG capsule Take 20 mg by mouth daily.    Historical Provider, MD  fluticasone (FLONASE) 50 MCG/ACT nasal spray Place 1 spray into the nose 2 (  two) times daily.    Historical Provider, MD  hydrOXYzine (ATARAX/VISTARIL) 25 MG tablet Take 25 mg by mouth every 8 (eight) hours as needed for anxiety.    Historical Provider, MD  methocarbamol (ROBAXIN) 500 MG tablet Take 500 mg by mouth daily as needed (muscle spasms).    Historical Provider, MD  omeprazole (PRILOSEC) 20 MG capsule Take 20 mg by mouth 2 (two) times daily.    Historical Provider, MD  ondansetron (ZOFRAN) 8 MG tablet Take 4 mg by mouth every 12 (twelve) hours as  needed for nausea.    Historical Provider, MD  polyethylene glycol (MIRALAX / GLYCOLAX) packet Take 17 g by mouth daily. Patient not taking: Reported on 08/20/2014 11/27/12   Dorian Heckle, MD  potassium chloride SA (K-DUR,KLOR-CON) 20 MEQ tablet Take 40 mEq by mouth 2 (two) times daily.    Historical Provider, MD  prochlorperazine (COMPAZINE) 5 MG tablet Take 10 mg by mouth every 4 (four) hours as needed for nausea.    Historical Provider, MD  senna (SENOKOT) 8.6 MG tablet Take 2 tablets by mouth 2 (two) times daily.    Historical Provider, MD  zolpidem (AMBIEN) 10 MG tablet Take 5 mg by mouth at bedtime as needed for sleep.    Historical Provider, MD   BP 145/70 mmHg  Pulse 56  Temp(Src) 98.1 F (36.7 C) (Oral)  Resp 12  Ht 5' 5.5" (1.664 m)  Wt 155 lb (70.308 kg)  BMI 25.39 kg/m2  SpO2 100% Physical Exam  Constitutional: She is oriented to person, place, and time. She appears well-developed and well-nourished.  HENT:  Head: Normocephalic and atraumatic.  Neck: Normal range of motion.  Cardiovascular: Normal rate, regular rhythm and normal heart sounds.   No murmur heard. Pulmonary/Chest: Effort normal and breath sounds normal. No respiratory distress. She has no wheezes. She has no rales. She exhibits tenderness.  Tenderness to left parasternal area.  Abdominal: Soft. Bowel sounds are normal. She exhibits no distension. There is no tenderness. There is no rebound and no guarding.  Musculoskeletal: Normal range of motion. She exhibits no edema.  Neurological: She is alert and oriented to person, place, and time. No cranial nerve deficit.  Skin: Skin is warm and dry.  Psychiatric: Her speech is normal.  Nursing note and vitals reviewed.   ED Course  Procedures (including critical care time) Labs Review Labs Reviewed  COMPREHENSIVE METABOLIC PANEL - Abnormal; Notable for the following:    Potassium 3.1 (*)    Glucose, Bld 104 (*)    ALT 13 (*)    All other components within  normal limits  CBC WITH DIFFERENTIAL/PLATELET  Randolm Idol, ED    Imaging Review Dg Chest 2 View  08/20/2014   CLINICAL DATA:  Left-sided chest pain for 3 days.  EXAM: CHEST  2 VIEW  COMPARISON:  11/24/2012  FINDINGS: Cardiac silhouette normal in size and configuration. Aorta is tortuous. No mediastinal or hilar masses or convincing adenopathy.  Lungs are hyperexpanded, but clear. No pleural effusion or pneumothorax.  Status post right mastectomy, stable. Bony thorax is demineralized but grossly intact.  IMPRESSION: No acute cardiopulmonary disease.   Electronically Signed   By: Lajean Manes M.D.   On: 08/20/2014 12:58     EKG Interpretation   Date/Time:  Tuesday August 20 2014 12:03:04 EDT Ventricular Rate:  69 PR Interval:  152 QRS Duration: 84 QT Interval:  406 QTC Calculation: 435 R Axis:   31 Text Interpretation:  Sinus rhythm with  occasional Premature ventricular  complexes ST \\T \ T wave abnormality, consider lateral ischemia Abnormal  ECG Confirmed by Alvino Chapel  MD, Ovid Curd [phone]) on 08/20/2014 2:16:41 PM      MDM   Final diagnoses:  Chest pain, unspecified chest pain type    Patient with chest pain. Has had it for the last week. Very reproducible. Cinnamon chest. EKG overall reassuring and enzymes negative. Negative x-ray. Will discharge home.    Davonna Belling, MD 08/20/14 [id number]

## 2014-11-02 ENCOUNTER — Emergency Department (HOSPITAL_COMMUNITY)
Admission: EM | Admit: 2014-11-02 | Discharge: 2014-11-02 | Disposition: A | Discharge status: Home / Self Care | Payer: Medicare HMO | Source: Home / Self Care | Attending: Family Medicine | Admitting: Family Medicine

## 2014-11-02 ENCOUNTER — Encounter (HOSPITAL_COMMUNITY): Payer: Self-pay | Admitting: *Deleted

## 2014-11-02 DIAGNOSIS — N390 Urinary tract infection, site not specified: Secondary | ICD-10-CM | POA: Diagnosis not present

## 2014-11-02 LAB — POCT URINALYSIS DIP (DEVICE)
BILIRUBIN URINE: NEGATIVE
Glucose, UA: NEGATIVE mg/dL
HGB URINE DIPSTICK: NEGATIVE
KETONES UR: NEGATIVE mg/dL
Nitrite: NEGATIVE
Protein, ur: NEGATIVE mg/dL
SPECIFIC GRAVITY, URINE: 1.01 (ref 1.005–1.030)
Urobilinogen, UA: 0.2 mg/dL (ref 0.0–1.0)
pH: 5.5 (ref 5.0–8.0)

## 2014-11-02 MED ORDER — CEPHALEXIN 500 MG PO CAPS: 500.0000 mg | ORAL_CAPSULE | Freq: Four times a day (QID) | ORAL | Status: DC

## 2014-11-02 NOTE — ED Notes (Signed)
Pt  Reports   Slow  abd  Pain      With pain  In the  Lower region of  Her  Stomach          She  Reports      Dizzy   As   Well      As  Well  As  Pain   When     Urinates

## 2014-11-02 NOTE — ED Provider Notes (Signed)
CSN: [id number]     Arrival date & time 11/02/14  1304 History   First MD Initiated Contact with Patient 11/02/14 1400     Chief Complaint  Patient presents with  . Urinary Tract Infection   (Consider location/radiation/quality/duration/timing/severity/associated sxs/prior Treatment) Patient is a 69 y.o. female presenting with dysuria. The history is provided by the patient.  Dysuria Pain quality:  Burning, sharp and stabbing Pain severity:  Mild Onset quality:  Gradual Duration:  2 days Progression:  Worsening Chronicity:  New Recent urinary tract infections: no   Relieved by:  None tried Worsened by:  Nothing tried Ineffective treatments:  None tried Urinary symptoms: frequent urination   Associated symptoms: abdominal pain and nausea   Associated symptoms: no fever, no flank pain, no vaginal discharge and no vomiting     Past Medical History  Diagnosis Date  . Hypertension   . Asthma    Past Surgical History  Procedure Laterality Date  . Abdominal hysterectomy    . Cholecystectomy    . Breast surgery    . Rotator cuff repair     Family History  Problem Relation Age of Onset  . Heart failure Father   . Cancer Mother   . Kidney failure Brother   . Hypertension Brother   . Heart disease Sister   . Stroke Sister   . Stroke Sister    Social History  Substance Use Topics  . Smoking status: Former Smoker    Quit date: 06/04/2012  . Smokeless tobacco: Never Used  . Alcohol Use: No   OB History    No data available     Review of Systems  Constitutional: Negative for fever and chills.  Gastrointestinal: Positive for nausea and abdominal pain. Negative for vomiting.  Genitourinary: Positive for dysuria, urgency, frequency and pelvic pain. Negative for flank pain, vaginal discharge and menstrual problem.  All other systems reviewed and are negative.   Allergies  Ivp dye; Lisinopril; Lortab; Milk-related compounds; Sodium hypochlorite; Sulfa antibiotics; and  Adhesive  Home Medications   Prior to Admission medications   Medication Sig Start Date End Date Taking? Authorizing Provider  albuterol (PROVENTIL HFA;VENTOLIN HFA) 108 (90 BASE) MCG/ACT inhaler Inhale 2 puffs into the lungs every 6 (six) hours as needed for wheezing.    Historical Provider, MD  cephALEXin (KEFLEX) 500 MG capsule Take 1 capsule (500 mg total) by mouth 4 (four) times daily. Take all of medicine and drink lots of fluids 11/02/14   Billy Fischer, MD  chlorpheniramine-HYDROcodone (TUSSIONEX) 10-8 MG/5ML LQCR Take 5 mLs by mouth every 12 (twelve) hours as needed. Patient not taking: Reported on 08/20/2014 11/27/12   Dorian Heckle, MD  dexamethasone (DECADRON) 4 MG tablet Take 8 mg by mouth See admin instructions. Take 2 tablets (8 mg) daily for 3 consecutive days following chemo to prevent nausea and vomiting    Historical Provider, MD  diltiazem (CARDIZEM CD) 360 MG 24 hr capsule Take 360 mg by mouth daily.    Historical Provider, MD  docusate sodium (COLACE) 100 MG capsule Take 200 mg by mouth 2 (two) times daily as needed for constipation.    Historical Provider, MD  FLUoxetine (PROZAC) 20 MG capsule Take 20 mg by mouth daily.    Historical Provider, MD  fluticasone (FLONASE) 50 MCG/ACT nasal spray Place 1 spray into the nose 2 (two) times daily.    Historical Provider, MD  hydrochlorothiazide (HYDRODIURIL) 25 MG tablet Take 25 mg by mouth daily.    Historical  Provider, MD  hydrOXYzine (ATARAX/VISTARIL) 25 MG tablet Take 25 mg by mouth every 8 (eight) hours as needed for anxiety.    Historical Provider, MD  methocarbamol (ROBAXIN) 500 MG tablet Take 500 mg by mouth daily as needed (muscle spasms).    Historical Provider, MD  omeprazole (PRILOSEC) 20 MG capsule Take 20 mg by mouth 2 (two) times daily.    Historical Provider, MD  ondansetron (ZOFRAN) 8 MG tablet Take 4 mg by mouth every 12 (twelve) hours as needed for nausea.    Historical Provider, MD  polyethylene glycol (MIRALAX /  GLYCOLAX) packet Take 17 g by mouth daily. Patient not taking: Reported on 08/20/2014 11/27/12   Dorian Heckle, MD  potassium chloride SA (K-DUR,KLOR-CON) 20 MEQ tablet Take 40 mEq by mouth 2 (two) times daily.    Historical Provider, MD  prochlorperazine (COMPAZINE) 5 MG tablet Take 10 mg by mouth every 4 (four) hours as needed for nausea.    Historical Provider, MD  senna (SENOKOT) 8.6 MG tablet Take 2 tablets by mouth 2 (two) times daily.    Historical Provider, MD  zolpidem (AMBIEN) 10 MG tablet Take 5 mg by mouth at bedtime as needed for sleep.    Historical Provider, MD   Meds Ordered and Administered this Visit  Medications - No data to display  There were no vitals taken for this visit. No data found.   Physical Exam  Constitutional: She is oriented to person, place, and time. She appears well-developed and well-nourished.  Abdominal: Soft. Normal appearance and bowel sounds are normal. She exhibits no distension and no mass. There is tenderness in the suprapubic area. There is no rigidity, no rebound and no guarding.    Neurological: She is alert and oriented to person, place, and time.  Skin: Skin is warm and dry.  Nursing note and vitals reviewed.   ED Course  Procedures (including critical care time)  Labs Review Labs Reviewed  POCT URINALYSIS DIP (DEVICE) - Abnormal; Notable for the following:    Leukocytes, UA SMALL (*)    All other components within normal limits   U/a abnl. Imaging Review No results found.   Visual Acuity Review  Right Eye Distance:   Left Eye Distance:   Bilateral Distance:    Right Eye Near:   Left Eye Near:    Bilateral Near:         MDM   1. UTI (lower urinary tract infection)    rx for keflex.    Billy Fischer, MD 11/02/14 [phone]

## 2014-11-02 NOTE — Discharge Instructions (Signed)
Take all of medicine as directed, drink lots of fluids, see your doctor if further problems. °

## 2014-11-11 ENCOUNTER — Encounter: Payer: Self-pay | Admitting: Family Medicine

## 2014-11-11 ENCOUNTER — Ambulatory Visit (INDEPENDENT_AMBULATORY_CARE_PROVIDER_SITE_OTHER): Payer: Medicare HMO | Admitting: Family Medicine

## 2014-11-11 VITALS — BP 152/91 | HR 73 | Temp 98.3°F | Ht 66.0 in | Wt 147.0 lb

## 2014-11-11 DIAGNOSIS — I739 Peripheral vascular disease, unspecified: Secondary | ICD-10-CM

## 2014-11-11 DIAGNOSIS — C50919 Malignant neoplasm of unspecified site of unspecified female breast: Secondary | ICD-10-CM | POA: Diagnosis not present

## 2014-11-11 DIAGNOSIS — I714 Abdominal aortic aneurysm, without rupture, unspecified: Secondary | ICD-10-CM

## 2014-11-11 DIAGNOSIS — M159 Polyosteoarthritis, unspecified: Secondary | ICD-10-CM

## 2014-11-11 DIAGNOSIS — I10 Essential (primary) hypertension: Secondary | ICD-10-CM | POA: Diagnosis not present

## 2014-11-11 DIAGNOSIS — Z86010 Personal history of colonic polyps: Secondary | ICD-10-CM

## 2014-11-11 DIAGNOSIS — F329 Major depressive disorder, single episode, unspecified: Secondary | ICD-10-CM

## 2014-11-11 DIAGNOSIS — M150 Primary generalized (osteo)arthritis: Secondary | ICD-10-CM

## 2014-11-11 DIAGNOSIS — N3000 Acute cystitis without hematuria: Secondary | ICD-10-CM

## 2014-11-11 DIAGNOSIS — F32A Depression, unspecified: Secondary | ICD-10-CM

## 2014-11-11 MED ORDER — DILTIAZEM HCL ER COATED BEADS 360 MG PO CP24: 360.0000 mg | ORAL_CAPSULE | Freq: Every day | ORAL | Status: DC

## 2014-11-11 NOTE — Patient Instructions (Signed)
Dear Kathryn [name], Thank you for coming in to clinic today. It was good to meet you!  1. Overall it sounds like you are doing well. 2. Refilled Diltiazem, keep taking your medicines daily. Reduce salt in diet. Stay active, start walking or going to gym. 3. Please call # to schedule Colonoscopy 4. Ordered your Abdominal US - stay tuned for appointment, you will go to [facility] Radiology Department. 5. Referral to General Surgery for breast cancer follow-up  Recommended flu shot, pneumonia shot.  Please schedule a follow-up appointment with Dr. Parks Ranger in 1 to 3 months for follow-up BP  If you have any other questions or concerns, please feel free to call the clinic to contact me. You may also schedule an earlier appointment if necessary.  However, if your symptoms get significantly worse, please go to the Emergency Department to seek immediate medical attention.  [name], [place]

## 2014-11-11 NOTE — Progress Notes (Signed)
Subjective:    Patient ID: Kathryn [name], female    DOB: 1945/03/07, 69 y.o.   MRN: [id number]  Kathryn [name] is a 69 y.o. female presenting on 11/11/2014 for New Patient (Initial Visit) and Nasal Congestion Previously followed at [facility] in [place].  HPI  RECENT UTI: - Reports she had a cold / URI recently, now mostly resolved with some lingering nasal congestion. Also went to Urgent Care last week 9/17 for UTI (did have one about 1 month prior) UA only with small leuks and no urine culture obtained, treated with antibiotics (Keflex 500mg  QID x 7 days). Improved with some still urgency and incomplete voiding symptoms, some aching but not dysuria. Denies hematuria.  ABDOMINAL AORTIC ANEURYSM: - Reports history of AAA chronically being followed at the [place]. She states last time it was checked about 3 years ago was "maybe 3.7cm" (she is not 100% on this measurement, thinks at least 3.5cm). Requests for repeat US screening to be ordered. She was not followed by any Vascular Surgery for this problem. No abdominal pain or other symptoms.  H/o COLON POLYPS: - Reported history of "colon polyps" followed by GI doctor in [place]. Stated last colonoscopy in 06/2011, had 3 polyps removed, also found a "red spot" was told they would "watch" this but unsure what diagnosis she was given. She thinks was told to get next colonoscopy in 2-3 years, believes she is due for this one. Requests to set up colonoscopy screening. No known fam hx colon CA.  BREAST CANCER: - Reports she had history of breast cancer in bilateral breasts dx in 2006 and 2014. She had Left breast lumpectomy and radiation in 2006, and then had Right complete mastectomy 2014 with chemotherapy (finished October 2015). No longer followed by any General Surgeons or Oncologist. No family history of breast, ovarian cancer. Interested in establishing continued surveillance. Denies any new breast lumps or symptoms today.  ARTHRITIS, GENERALIZED: -  Chronic history of osteoarthritis low back, knees, fingers. No prior surgeries. - Additionally did have a fall in October 2014, may have had torn muscles in rotator cuff, advised surgery but patient declined, now occasionally has problems - Off Robaxin, not helping. Not taking regular Tylenol or NSAIDs  CHRONIC HTN: Reports does check BP occasionally at home, usually 120s/80s. Current Meds - Diltiazem 360mg  (24 hr), HCTZ 25mg    Reports good compliance, took meds today. Tolerating well, w/o complaints. Lifestyle - planning to get back to gym, no regular exercise currently Denies CP, dyspnea, HA, edema, dizziness / lightheadedness  DEPRESSION / ANXIETY: - Chronic history of depression for years, treated at [place]. Initially treated with Sertraline for years, then eventually switched to Prozac for about 1 year, since self discontinued. Previously on Ambien, since off this medicine (had side effects with amnesia, and has been off 1 year). - Currently with good mood and doing well per report. PHQ-2 screening 0. Mood and anxiety symptoms not significantly impacting her life at this time. Denies any suicidal or homicidal ideation.  PAD: - History of ABI, advised to take ASA 81mg  daily  Social Hx: - Veteran, served in [employer] during [place] era - History of former tobacco abuse, smoked approx 0.5 to 1ppd until 2003, quit since - Lives at home with daughter Legrand Como, age 28) and grandchildren  Past Medical History  Diagnosis Date  . Hypertension 1978  . Asthma 2003  . COPD (chronic obstructive pulmonary disease) 2003  . Cancer 2006  . GERD (gastroesophageal reflux disease)  2003  . Migraine headache 2008   Social History   Social History  . Marital Status: Divorced    Spouse Name: N/A  . Number of Children: N/A  . Years of Education: N/A   Occupational History  . Not on file.   Social History Main Topics  . Smoking status: Former Smoker    Start date: 05/16/1973    Quit date:  05/16/2001  . Smokeless tobacco: Never Used  . Alcohol Use: No  . Drug Use: No  . Sexual Activity:    Partners: Male   Other Topics Concern  . Not on file   Social History Narrative   Family History  Problem Relation Age of Onset  . Heart failure Father   . Cancer Mother   . Kidney failure Brother   . Hypertension Brother   . Heart disease Sister   . Stroke Sister   . Stroke Sister    Current Outpatient Prescriptions on File Prior to Visit  Medication Sig  . docusate sodium (COLACE) 100 MG capsule Take 200 mg by mouth 2 (two) times daily as needed for constipation.  . hydrochlorothiazide (HYDRODIURIL) 25 MG tablet Take 25 mg by mouth daily.  . polyethylene glycol (MIRALAX / GLYCOLAX) packet Take 17 g by mouth daily.  . potassium chloride SA (K-DUR,KLOR-CON) 20 MEQ tablet Take 40 mEq by mouth 2 (two) times daily.  Marland Kitchen albuterol (PROVENTIL HFA;VENTOLIN HFA) 108 (90 BASE) MCG/ACT inhaler Inhale 2 puffs into the lungs every 6 (six) hours as needed for wheezing.  Marland Kitchen FLUoxetine (PROZAC) 20 MG capsule Take 20 mg by mouth daily.  . fluticasone (FLONASE) 50 MCG/ACT nasal spray Place 1 spray into the nose 2 (two) times daily.  Marland Kitchen senna (SENOKOT) 8.6 MG tablet Take 2 tablets by mouth 2 (two) times daily.   No current facility-administered medications on file prior to visit.    Review of Systems  Constitutional: Negative for fever, chills, diaphoresis, activity change, appetite change and fatigue.  HENT: Negative for congestion and hearing loss.   Eyes: Negative for visual disturbance.  Respiratory: Negative for cough, chest tightness, shortness of breath and wheezing.   Cardiovascular: Negative for chest pain, palpitations and leg swelling.  Gastrointestinal: Negative for nausea, vomiting, abdominal pain, diarrhea, constipation, blood in stool and anal bleeding.  Genitourinary: Positive for difficulty urinating (occasional incomplete voiding). Negative for dysuria (resolved), urgency,  frequency (improved), hematuria, flank pain and decreased urine volume.  Musculoskeletal: Positive for arthralgias (bilateral fingers and knees, minimal pain currently). Negative for neck pain.  Skin: Negative for rash.  Neurological: Negative for dizziness, weakness, light-headedness, numbness and headaches.  Hematological: Negative for adenopathy.  Psychiatric/Behavioral: Negative for suicidal ideas, behavioral problems, confusion, sleep disturbance, self-injury, dysphoric mood and agitation. The patient is not nervous/anxious.    Per HPI unless specifically indicated above     Objective:    BP 152/91 mmHg  Pulse 73  Temp(Src) 98.3 F (36.8 C) (Oral)  Ht 5\' 6"  (1.676 m)  Wt 147 lb (66.679 kg)  BMI 23.74 kg/m2  Wt Readings from Last 3 Encounters:  11/11/14 147 lb (66.679 kg)  08/20/14 155 lb (70.308 kg)  06/14/13 155 lb (70.308 kg)    Physical Exam  Constitutional: She is oriented to person, place, and time. She appears well-developed and well-nourished. No distress.  Elderly female, comfortable and cooperative.  HENT:  Head: Normocephalic and atraumatic.  Mouth/Throat: Oropharynx is clear and moist.  Eyes: Conjunctivae and EOM are normal. Pupils are equal, round, and  reactive to light.  Neck: Normal range of motion. Neck supple. No thyromegaly present.  Cardiovascular: Normal rate, regular rhythm, normal heart sounds and intact distal pulses.   No murmur heard. Pulmonary/Chest: Effort normal and breath sounds normal. No respiratory distress. She has no wheezes. She has no rales.  Good air movement. Speaks full sentences.  Abdominal: Soft. Bowel sounds are normal. She exhibits no distension and no mass. There is no tenderness. There is no rebound and no guarding.  Large palpable abdominal pulse but no significant gross pulsatile mass. +abdominal bruit heard.  Musculoskeletal: Normal range of motion. She exhibits no edema or tenderness.       Lumbar back: Normal. She exhibits  normal range of motion, no tenderness, no bony tenderness, no swelling, no edema, no pain and no spasm.  Lymphadenopathy:    She has no cervical adenopathy.  Neurological: She is alert and oriented to person, place, and time. No cranial nerve deficit.  Skin: Skin is warm and dry. No rash noted. She is not diaphoretic.  Right breast s/p complete mastectomy. Left breast intact s/p lumpectomy with well healed surgical incision. No obvious abnormalities or masses.  Psychiatric: She has a normal mood and affect. Her behavior is normal. Judgment and thought content normal.  Nursing note and vitals reviewed.  Results for orders placed or performed during the hospital encounter of 11/02/14  POCT urinalysis dip (device)  Result Value Ref Range   Glucose, UA NEGATIVE NEGATIVE mg/dL   Bilirubin Urine NEGATIVE NEGATIVE   Ketones, ur NEGATIVE NEGATIVE mg/dL   Specific Gravity, Urine 1.010 1.005 - 1.030   Hgb urine dipstick NEGATIVE NEGATIVE   pH 5.5 5.0 - 8.0   Protein, ur NEGATIVE NEGATIVE mg/dL   Urobilinogen, UA 0.2 0.0 - 1.0 mg/dL   Nitrite NEGATIVE NEGATIVE   Leukocytes, UA SMALL (A) NEGATIVE      Assessment & Plan:   Problem List Items Addressed This Visit      Cardiovascular and Mediastinum   AAA (abdominal aortic aneurysm) - Primary    Chronic AAA per report approx 3.5 to 3.7cm, awaiting records from [place]. Asymptomatic and no known h/o complications. Does also have reported PAD from prior ABIs. Poorly controlled HTN. Former smoker x 25 years but quit >15 years now. - last abd [place] surveillance reported about 3 years ago  Plan: 1. Ordered Abd [place] (retroperitoneal complete) for routine AAA surveillance, to be scheduled and pt notified by [facility] nursing staff 2. If AAA size remains < 4 cm, would be considered small still and recommend routine [place] monitoring q 2-3 years 3. Improve HTN control, re-check BP next visit, likely add ARB. No clear evidence for any particular HTN therapy such as BB in  prevention AAA. 4. Continue ASA 81 daily 5. Discuss starting mod-high intensity statin therapy (ASCVD risk >10%) and benefit in patients s/p AAA repair, but may contribute to prevention, especially in PAD patient      Relevant Medications   diltiazem (CARDIZEM CD) 360 MG 24 hr capsule   aspirin EC 81 MG tablet   Other Relevant Orders   [place] Retroperitoneal Comp   Hypertension    Elevated BP today, not at goal < 140/90. Manual re-check slight improve still elevated >947 SBP No complications - concern with AAA and PAD, likely contributing factor to these problems - ACE allergy   Plan:  1. Continue current BP meds - Diltiazem-24hr 360mg  daily, HCTZ 25mg  daily - refilled Diltiazem today, no new addition given reported home  BPs lower. 2. Not due for BMET. Last checked 08/2014, stable SCr 0.73 / nml GFR 3. Lifestyle Mods - Advised start low impact regular exercise, Dec salt intake, inc K+ rich vegs. Remain smoke free. 4. Monitor BP at home or at drug store occasionally. 5. Elevated ASCVD 10 yr risk (with example normal cholesterol) >10%, meet criteria for mod-high intensity statin, may also benefit in setting of AAA (although no evidence for prevention in unrepaired aneurysm) 6. RTC 1-3 mo re-check BP, anticipate trial on ARB therapy if not improved       Relevant Medications   diltiazem (CARDIZEM CD) 360 MG 24 hr capsule   aspirin EC 81 MG tablet   Peripheral arterial disease    Previously diagnosed on ABI. Awaiting records requested from [place]. Likely in elderly former smoker with known AAA and poorly controlled HTN.  Plan: 1. Await records 2. Consider repeat ABI at [facility] in [facility]acy [facility] with Dr. Valentina Lucks 3. Future may refer to vascular surgery if needed      Relevant Medications   diltiazem (CARDIZEM CD) 360 MG 24 hr capsule   aspirin EC 81 MG tablet     Musculoskeletal and Integument   Osteoarthritis, multiple sites    Chronic OA, multiple joints b/l hands, knees, low back. No active  problem currently, stable controlled symptoms w/o flare. - No recent x-rays on chart  Plan: 1. Request records from [place] 2. May take Tylenol PRN      Relevant Medications   aspirin EC 81 MG tablet     Other   Breast cancer    Currently no active therapy. No longer followed by surgery / oncology. - Left Breast CA - 2006, s/p lumpectomy and radiation - Right Breast CA - 2014, s/p complete mastectomy and chemotherapy - last mammo reported 2015  Plan 1. Referral to General Surgery for establish routine surveillance s/p breast cancer      Relevant Medications   aspirin EC 81 MG tablet   Other Relevant Orders   Ambulatory referral to General Surgery   Depression    Currently stable. No concerns. - PHQ-2: score 0 - Prior meds - Sertraline (worked for years, then switched), Prozac (on 1 year ago, self discontinued, no longer helping/not needed)  Plan: 1. Continue to monitor off all anti-depressant medications      History of colonic polyps    Asymptomatic. No GI red flags. H/o colon polyps x 3 removed on colonoscopy in 2013 by GI in [place].  Plan: 1. Given info and phone # to call [facility] to schedule surveillance colonoscopy (due now, has been 2-3 years)       Other Visit Diagnoses    Acute cystitis without hematuria        Resolved. Treated w/ Keflex on 9/17. Now some incomplete voiding symptoms, no leakage. Follow-up, may need further Urological eval if persistent LUTS/OAB       Meds ordered this encounter  Medications  . diltiazem (CARDIZEM CD) 360 MG 24 hr capsule    Sig: Take 1 capsule (360 mg total) by mouth daily.    Dispense:  30 capsule    Refill:  5  . aspirin EC 81 MG tablet    Sig: Take 1 tablet (81 mg total) by mouth daily.      Follow up plan: Return in about 4 weeks (around 12/09/2014) for blood pressure.  [name], [place], PGY-3

## 2014-11-12 DIAGNOSIS — M159 Polyosteoarthritis, unspecified: Secondary | ICD-10-CM | POA: Insufficient documentation

## 2014-11-12 DIAGNOSIS — I739 Peripheral vascular disease, unspecified: Secondary | ICD-10-CM | POA: Insufficient documentation

## 2014-11-12 MED ORDER — ASPIRIN EC 81 MG PO TBEC: 81.0000 mg | DELAYED_RELEASE_TABLET | Freq: Every day | ORAL | Status: DC

## 2014-11-12 NOTE — Assessment & Plan Note (Signed)
Asymptomatic. No GI red flags. H/o colon polyps x 3 removed on colonoscopy in 2013 by GI in [place].  Plan: 1. Given info and phone # to call [facility] to schedule surveillance colonoscopy (due now, has been 2-3 years)

## 2014-11-12 NOTE — Assessment & Plan Note (Addendum)
Previously diagnosed on ABI. Awaiting records requested from [place]. Likely in elderly former smoker with known AAA and poorly controlled HTN.  Plan: 1. Await records 2. Consider repeat ABI at [facility] in [facility] with Dr. Valentina Lucks 3. Future may refer to vascular surgery if needed

## 2014-11-12 NOTE — Assessment & Plan Note (Signed)
Chronic AAA per report approx 3.5 to 3.7cm, awaiting records from [place]. Asymptomatic and no known h/o complications. Does also have reported PAD from prior ABIs. Poorly controlled HTN. Former smoker x 25 years but quit >15 years now. - last abd [place] surveillance reported about 3 years ago  Plan: 1. Ordered Abd [place] (retroperitoneal complete) for routine AAA surveillance, to be scheduled and pt notified by [facility] nursing staff 2. If AAA size remains < 4 cm, would be considered small still and recommend routine [place] monitoring q 2-3 years 3. Improve HTN control, re-check BP next visit, likely add ARB. No clear evidence for any particular HTN therapy such as BB in prevention AAA. 4. Continue ASA 81 daily 5. Discuss starting mod-high intensity statin therapy (ASCVD risk >10%) and benefit in patients s/p AAA repair, but may contribute to prevention, especially in PAD patient

## 2014-11-12 NOTE — Assessment & Plan Note (Addendum)
Elevated BP today, not at goal < 140/90. Manual re-check slight improve still elevated >500 SBP No complications - concern with AAA and PAD, likely contributing factor to these problems - ACE allergy   Plan:  1. Continue current BP meds - Diltiazem-24hr 360mg  daily, HCTZ 25mg  daily - refilled Diltiazem today, no new addition given reported home BPs lower. 2. Not due for BMET. Last checked 08/2014, stable SCr 0.73 / nml GFR 3. Lifestyle Mods - Advised start low impact regular exercise, Dec salt intake, inc K+ rich vegs. Remain smoke free. 4. Monitor BP at home or at drug store occasionally. 5. Elevated ASCVD 10 yr risk (with example normal cholesterol) >10%, meet criteria for mod-high intensity statin, may also benefit in setting of AAA (although no evidence for prevention in unrepaired aneurysm) 6. RTC 1-3 mo re-check BP, anticipate trial on ARB therapy if not improved

## 2014-11-12 NOTE — Assessment & Plan Note (Signed)
Chronic OA, multiple joints b/l hands, knees, low back. No active problem currently, stable controlled symptoms w/o flare. - No recent x-rays on chart  Plan: 1. Request records from [place] 2. May take Tylenol PRN

## 2014-11-12 NOTE — Assessment & Plan Note (Signed)
Currently stable. No concerns. - PHQ-2: score 0 - Prior meds - Sertraline (worked for years, then switched), Prozac (on 1 year ago, self discontinued, no longer helping/not needed)  Plan: 1. Continue to monitor off all anti-depressant medications

## 2014-11-12 NOTE — Assessment & Plan Note (Addendum)
Currently no active therapy. No longer followed by surgery / oncology. - Left Breast CA - 2006, s/p lumpectomy and radiation - Right Breast CA - 2014, s/p complete mastectomy and chemotherapy - last mammo reported 2015  Plan 1. Referral to General Surgery for establish routine surveillance s/p breast cancer

## 2014-11-28 ENCOUNTER — Telehealth: Payer: Self-pay | Admitting: Family Medicine

## 2014-11-28 ENCOUNTER — Ambulatory Visit (INDEPENDENT_AMBULATORY_CARE_PROVIDER_SITE_OTHER): Payer: Medicare HMO | Admitting: Family Medicine

## 2014-11-28 ENCOUNTER — Encounter: Payer: Self-pay | Admitting: Family Medicine

## 2014-11-28 VITALS — BP 138/87 | HR 87 | Temp 98.5°F | Wt 142.0 lb

## 2014-11-28 DIAGNOSIS — R300 Dysuria: Secondary | ICD-10-CM | POA: Diagnosis not present

## 2014-11-28 DIAGNOSIS — N390 Urinary tract infection, site not specified: Secondary | ICD-10-CM

## 2014-11-28 LAB — POCT URINALYSIS DIPSTICK
Bilirubin, UA: NEGATIVE
Blood, UA: NEGATIVE
Glucose, UA: NEGATIVE
Ketones, UA: NEGATIVE
Nitrite, UA: NEGATIVE
PH UA: 5.5
PROTEIN UA: NEGATIVE
Spec Grav, UA: <=1.005
Urobilinogen, UA: 0.2

## 2014-11-28 LAB — POCT UA - MICROSCOPIC ONLY

## 2014-11-28 MED ORDER — CIPROFLOXACIN HCL 250 MG PO TABS: 250.0000 mg | ORAL_TABLET | Freq: Two times a day (BID) | ORAL | Status: DC

## 2014-11-28 NOTE — Telephone Encounter (Signed)
Returned patient call, informed her that it was most likely a reminder call about her ultrasound at [place]. Patient expressed understanding.

## 2014-11-28 NOTE — Telephone Encounter (Signed)
Pt called because she said that we called her. I didn't see any notes. Can we call back jw

## 2014-11-28 NOTE — Patient Instructions (Signed)
For urine: -checking urine culture -take ciprofloxacin 250mg  twice a day for 7 days -drink plenty of fluids.  -I think your dizziness is because your blood pressure drops when you stand. Please get up very slowly and take it easy -I am referring you to a urologist for your urinary symptoms. You will get a phone call to schedule this appointment.   For aneurysm: -keep appointment for ultrasound tomorrow -if any severe abdominal pain or sudden worsening of lightheadedness, please go to ER immediately  If you are not feeling better next week please follow up here in the clinic.  Be well, Dr. Ardelia Mems

## 2014-11-29 ENCOUNTER — Telehealth: Payer: Self-pay | Admitting: Family Medicine

## 2014-11-29 ENCOUNTER — Ambulatory Visit (HOSPITAL_COMMUNITY)
Admission: RE | Admit: 2014-11-29 | Discharge: 2014-11-29 | Disposition: A | Discharge status: Home / Self Care | Payer: Medicare HMO | Source: Ambulatory Visit | Attending: Family Medicine | Admitting: Family Medicine

## 2014-11-29 ENCOUNTER — Other Ambulatory Visit: Payer: Self-pay | Admitting: Family Medicine

## 2014-11-29 DIAGNOSIS — I714 Abdominal aortic aneurysm, without rupture, unspecified: Secondary | ICD-10-CM

## 2014-11-29 LAB — URINE CULTURE
Colony Count: NO GROWTH
Organism ID, Bacteria: NO GROWTH

## 2014-11-29 NOTE — Telephone Encounter (Signed)
Pt called and wanted to  Know if we had the results of her [place]. jw

## 2014-12-02 NOTE — Progress Notes (Signed)
Patient ID: Kathryn [name], female   DOB: 1945-08-09, 69 y.o.   MRN: [id number] Date of Visit: 11/28/2014   HPI:  Patient presents to discuss possible UTI.   Patient reports 3 episodes recently of urinary symptoms. Having lower abdomina/suprapubic pain. Also having some pain in her back. Endorses urinary hesitancy, some frequency. Mild discomfort with urination but no frank dysuria. Has never seen urology in the past. Drinking well. Also endorses dizziness & lightheadedness ever since this started. Seen at Urgent Care on 9/17 and diagnosed with UTI, started on keflex for this but no urine culture obtained. Then followed up on 9/26 for new patient visit here at [facility] with Dr. Parks Ranger.  Note patient has history of abdominal aortic aneurysm. Has ultrasound scheduled tomorrow 10/14 to assess current aneurysm size.   ROS: See HPI.  [place]: history breast cancer, AAA, COPD, depression, hypertension, osteoarthritis, PAD  PHYSICAL EXAM: BP 138/87 mmHg  Pulse 87  Temp(Src) 98.5 F (36.9 C) (Oral)  Wt 142 lb (64.411 kg)  Orthostatic vitals: lying 147/87 (pulse 68), sitting 125/90 (pulse 80), standing 114/85 (pulse 99) Gen: NAD, pleasant, cooperative HEENT: normocephalic, atraumatic, moist mucous membranes  Heart: regular rate and rhythm no murmur Lungs: clear to auscultation bilaterally, normal work of breathing Abdomen: soft without guarding or peritoneal signs. Pulsatile abdomen noted (appx 4-5 cm of pulsation noted). normoactive bowel sounds. Mild suprapubic tenderness. Back: no CVA tenderness Neuro: grossly nonfocal, speech normal Ext: atraumatic. 2+ pedal pulses bilat  ASSESSMENT/PLAN:  69 yo F presenting with urinary symptoms and lightheadedness.  1. UTI - Afebrile and well appearing here. Urinalysis with 2+ leukocytes, 5-10 whites, 2+ bacteria, and 5-10 epithelial cells. In context of urinary symtpoms, warrants treatment for UTI. Options are limited due to allergy to  bactrim, recent failure of keflex, and age prohibiting safe use of nitrofurantoin. After discussion with clinical pharmacist, will proceed with rx for cipro. Given persistence of symptoms and recurrent episodes, will refer to urology for further evaluation.   2. Lightheadedness - orthostatics are positive. Encourage PO hydration, caution with going from sitting to standing.   3. Aneurysm - has appointment scheduled tomorrow 10/14 to evaluate size. As BP's presently stable with good distal perfusion and no significant abdominal pain, doubt any dissecting aneurysm at this time. Await u/s results, discussed return precautions with patient.   FOLLOW UP: F/u as needed if symptoms worsen or do not improve.  Referring to urology.  [place]. Ardelia Mems, [place]

## 2014-12-03 NOTE — Telephone Encounter (Signed)
Last OV 10/13, I saw pt as new patient 9/26, history of AAA previously reported about 3.7 cm diameter approx 3 years ago but no record on hand. Repeat abdominal AAA US done on 10/14 with maximum diameter at 3.9 cm, which would consider still stable and still a small AAA (< 4 cm). See office visit from 10/2014 for plans for improvement of BP and start statin therapy.  I called patient and reviewed her above [place] results 3.9cm AAA, she understands and I recommended repeat US for follow-up in 1-2 years, instead of waiting 3 years as we do not know the rate of change without prior US.  Also, she asked about recent urine culture, she is improved on antibiotics and wanted to know culture results, Negative Growth (from 10/13).  [name], [place], PGY-3

## 2015-01-15 ENCOUNTER — Ambulatory Visit (INDEPENDENT_AMBULATORY_CARE_PROVIDER_SITE_OTHER): Payer: Medicare HMO | Admitting: Family Medicine

## 2015-01-15 ENCOUNTER — Ambulatory Visit (HOSPITAL_COMMUNITY)
Admission: RE | Admit: 2015-01-15 | Discharge: 2015-01-15 | Disposition: A | Discharge status: Home / Self Care | Payer: Medicare HMO | Source: Ambulatory Visit | Attending: Family Medicine | Admitting: Family Medicine

## 2015-01-15 ENCOUNTER — Encounter: Payer: Self-pay | Admitting: Family Medicine

## 2015-01-15 VITALS — BP 148/83 | HR 78 | Temp 99.0°F | Wt 141.0 lb

## 2015-01-15 DIAGNOSIS — R05 Cough: Secondary | ICD-10-CM | POA: Insufficient documentation

## 2015-01-15 DIAGNOSIS — I10 Essential (primary) hypertension: Secondary | ICD-10-CM | POA: Diagnosis not present

## 2015-01-15 DIAGNOSIS — J189 Pneumonia, unspecified organism: Secondary | ICD-10-CM | POA: Diagnosis not present

## 2015-01-15 DIAGNOSIS — Z87440 Personal history of urinary (tract) infections: Secondary | ICD-10-CM | POA: Diagnosis not present

## 2015-01-15 DIAGNOSIS — R509 Fever, unspecified: Secondary | ICD-10-CM | POA: Insufficient documentation

## 2015-01-15 MED ORDER — LEVOFLOXACIN 500 MG PO TABS: 500.0000 mg | ORAL_TABLET | Freq: Every day | ORAL | Status: DC

## 2015-01-15 MED ORDER — BENZONATATE 100 MG PO CAPS: 100.0000 mg | ORAL_CAPSULE | Freq: Two times a day (BID) | ORAL | Status: DC | PRN

## 2015-01-15 NOTE — Patient Instructions (Signed)
Thank you for coming in to clinic today.  1. We will treat you for possible Pneumonia - take Levaquin antibiotic 500mg  daily for 7 days, finish entire course. 2. Ordered Chest X-ray - please go to [facility] Radiology Department anytime in next 1-2 days during business hours. I will call you with results if concerning, otherwise just finish the antibiotics. 3. Take cough medicine as needed 4. Recommend to start taking Tylenol Extra Strength 500mg  tabs - take 1 to 2 tabs (max 1000mg  per dose) every 6 hours for pain (take regularly, don't skip a dose for next 3-7 days), max 24 hour daily dose is 6 to 8 tablets or 3000 to 4000mg   Stay hydrated!  Follow-up with [facility] as scheduled for "Recurrent UTIs"  Please schedule a follow-up appointment with Dr Parks Ranger in 1-2 weeks to follow-up Pneumonia and Follow-up BP  If you have any other questions or concerns, please feel free to call the clinic to contact me. You may also schedule an earlier appointment if necessary.  However, if your symptoms get significantly worse, please go to the Emergency Department to seek immediate medical attention.  [name], [place]

## 2015-01-15 NOTE — Assessment & Plan Note (Signed)
Today with some suprapubic pain and left flank pain (more likely related to coughing, MSK). No dysuria (since resolved). Subjective fevers. Does not clinically seems consistent with Pyelo. - Previous history of recurrent UTI. Last seen 11/2014 treated for UTI with Cipro, had prior failure of Keflex. Last Urine culture no growth.  Plan: 1. No repeat Urine Cx today 2. Treating possible CAP with Levaquin, would cover any potential UTI 3. Follow-up as scheduled with new initial apt [facility] in 1-2 weeks for recurrent UTI

## 2015-01-15 NOTE — Assessment & Plan Note (Addendum)
Consistent with CAP (subjective fevers/chills, L-crackles, post-viral URI), has not received udpated PNA vax. No recent hospitalization with IV antibiotics or other risk factors. History of PNA 1-2 years ago, with similar symptoms per patient. Less consistent with influenza. - No hypoxia (98-100% on RA), low grade temp 44F currently - H/o COPD but no wheezing actively, not on maintenance therapy, former smoker  Plan: 1. Start empiric antibiotics - Levaquin 500mg  daily x 7 days (checked last EKG 08/2014 nml QTc 430s) 2. Ordered CXR 2v at [facility] to possibly confirm dx CAP vs rule out complication with pleural effusion 3. No wheezing. No steroid course. 4. RTC about 1-2 weeks follow-up CAP / HTN. Return criteria given if worsening.  UPDATE: - CXR 2v without evidence of any infiltrate. No acute cardiopulm disease - Phoned result to patient. Advised to complete antibiotic course, as CXR findings may lag behind, reassurance no complication or pleural effusion, also Levaquin would cover potential sinusitis.

## 2015-01-15 NOTE — Assessment & Plan Note (Signed)
Improved BP today despite not taking HCTZ (self discontinued) No complications   Plan:  1. Continues Diltiazem-24hr 360mg  daily, self discontinued HCTZ, advised will need to add likely ARB vs CCB at next visit, does not want to resume thiazide 2. Lifestyle Mods - Exercise, Dec salt intake, inc K+ rich vegs 3. Monitor BP at home or at drug store occasionally 4. RTC 1-2 weeks to follow-up BP, did not address as primary concern today due to concern for CAP

## 2015-01-15 NOTE — Progress Notes (Signed)
Subjective:    Patient ID: Kathryn [name], female    DOB: May 08, 1945, 69 y.o.   MRN: [id number]  Kathryn [name] is a 69 y.o. female presenting on 01/15/2015 for Hypertension and Flank Pain  HPI   URI / POSSIBLE CAP - Reports recent course with URI >2 weeks ago with sinus congestion and pressure, cough. Now worsening after initial improved congestion but transitioned to continued coughing (less productive) worst in evening, complains now of Left flank / back pain, also described as aching pain, worse with coughing / straining. Reports current symptoms "feels like last time she had Pneumonia 1 year ago" - Admits chills and sweating episode last night with subjective fever - Admits HA x 2 weeks - Has tried Tylenol +cold, currently not taking any other OTC - Poor appetite, trying to stay hydrated  RECENT UTI, H/o recurrent UTI - Last seen 11/28/14 for Dysuria, possible recurrent UTI with 3 episodes of urinary symptoms and lower abdominal pain over past several months. Also with frequency at that time. UA at that time mod leuks. Urine culture obtained with No Growth. She was treated with Cipro 250 BID x 7 days. Previously had failed Keflex course. - Today reports resolved dysuria and without any hematuria. Does admit to some lower abdominal pain same from last visit, described as a persistent "ache" severity 6-7/10, constant without significant relief, not worsening over past month - She is scheduled to see [facility] in 1-2 weeks December (initial new visit)  CHRONIC HTN: Reports no new concerns. Recently self discontinued HCTZ, does not want to take fluid pill anymore Current Meds - Diltiazem 360mg  (24 hr) Reports good compliance, took meds today. Tolerating well, w/o complaints - Complication: known PAD with AAA, stable recent US Denies CP, dyspnea, HA, edema, dizziness / lightheadedness   Past Medical History  Diagnosis Date  . Hypertension 1978  . Asthma 2003  . COPD (chronic  obstructive pulmonary disease) ([place]) 2003  . Cancer ([place]) 2006  . GERD (gastroesophageal reflux disease) 2003  . Migraine headache 2008   Social History   Social History  . Marital Status: Divorced    Spouse Name: N/A  . Number of Children: N/A  . Years of Education: N/A   Occupational History  . Not on file.   Social History Main Topics  . Smoking status: Former Smoker    Start date: 05/16/1973    Quit date: 05/16/2001  . Smokeless tobacco: Never Used  . Alcohol Use: No  . Drug Use: No  . Sexual Activity:    Partners: Male   Other Topics Concern  . Not on file   Social History Narrative   Family History  Problem Relation Age of Onset  . Heart failure Father   . Cancer Mother   . Kidney failure Brother   . Hypertension Brother   . Heart disease Sister   . Stroke Sister   . Stroke Sister    Current Outpatient Prescriptions on File Prior to Visit  Medication Sig  . aspirin EC 81 MG tablet Take 1 tablet (81 mg total) by mouth daily.  Marland Kitchen diltiazem (CARDIZEM CD) 360 MG 24 hr capsule Take 1 capsule (360 mg total) by mouth daily.  Marland Kitchen docusate sodium (COLACE) 100 MG capsule Take 200 mg by mouth 2 (two) times daily as needed for constipation.  . potassium chloride SA (K-DUR,KLOR-CON) 20 MEQ tablet Take 40 mEq by mouth 2 (two) times daily.  Marland Kitchen albuterol (PROVENTIL HFA;VENTOLIN HFA) 108 (90 BASE) MCG/ACT  inhaler Inhale 2 puffs into the lungs every 6 (six) hours as needed for wheezing.  Marland Kitchen FLUoxetine (PROZAC) 20 MG capsule Take 20 mg by mouth daily.  . fluticasone (FLONASE) 50 MCG/ACT nasal spray Place 1 spray into the nose 2 (two) times daily.  . hydrochlorothiazide (HYDRODIURIL) 25 MG tablet Take 25 mg by mouth daily.  . polyethylene glycol (MIRALAX / GLYCOLAX) packet Take 17 g by mouth daily. (Patient not taking: Reported on 01/15/2015)  . senna (SENOKOT) 8.6 MG tablet Take 2 tablets by mouth 2 (two) times daily.   No current facility-administered medications on file prior to  visit.    Review of Systems  Constitutional: Positive for chills. Negative for diaphoresis, activity change, appetite change (chronic poor appetite), fatigue and unexpected weight change.  HENT: Negative for congestion, postnasal drip and rhinorrhea.   Respiratory: Positive for cough (improved from prior, non-productive currently). Negative for chest tightness and wheezing.   Cardiovascular: Negative for leg swelling.  Gastrointestinal: Negative for nausea, vomiting, diarrhea, constipation, blood in stool and anal bleeding.  Genitourinary: Negative for urgency, frequency (resolved), hematuria, decreased urine volume and difficulty urinating.  Musculoskeletal: Negative for arthralgias.  Neurological: Negative for dizziness.  Psychiatric/Behavioral: Negative for confusion.   Per HPI unless specifically indicated above     Objective:    BP 148/83 mmHg  Pulse 78  Temp(Src) 99 F (37.2 C) (Oral)  Wt 141 lb (63.957 kg)  SpO2 99%  Wt Readings from Last 3 Encounters:  01/15/15 141 lb (63.957 kg)  11/28/14 142 lb (64.411 kg)  11/11/14 147 lb (66.679 kg)    Physical Exam  Constitutional: She is oriented to person, place, and time. She appears well-developed and well-nourished. No distress.  Elderly female, comfortable and cooperative.  HENT:  Head: Normocephalic and atraumatic.  Nose: Nose normal.  Mouth/Throat: Oropharynx is clear and moist. No oropharyngeal exudate.  Mild frontal sinus tenderness to palpation  Eyes: Conjunctivae are normal.  Neck: Normal range of motion. Neck supple.  Cardiovascular: Normal rate, regular rhythm and intact distal pulses.   Murmur (2/6 systolic ejection murmur loudest at apex) heard. Pulmonary/Chest: Effort normal. No respiratory distress. She has no wheezes. She has rales (Left basilar).  Good air movement. Speaks full sentences.  Abdominal: Soft. Bowel sounds are normal. She exhibits no distension and no mass. There is tenderness (mild suprapubic  tenderness to palpation). There is no rebound and no guarding.  Stable, large palpable abdominal pulse but no significant gross pulsatile mass. +abdominal bruit heard.  Musculoskeletal: She exhibits no edema.       Lumbar back: Normal. She exhibits normal range of motion, no tenderness, no bony tenderness, no swelling, no edema, no pain and no spasm.  Neurological: She is alert and oriented to person, place, and time.  Skin: Skin is warm and dry. She is not diaphoretic.  Nursing note and vitals reviewed.  Results for orders placed or performed in visit on 11/28/14  Urine culture  Result Value Ref Range   Colony Count NO GROWTH    Organism ID, Bacteria NO GROWTH   POCT urinalysis dipstick  Result Value Ref Range   Color, UA YELLOW    Clarity, UA CLEAR    Glucose, UA NEG    Bilirubin, UA NEG    Ketones, UA NEG    Spec Grav, UA <=1.005    Blood, UA NEG    pH, UA 5.5    Protein, UA NEG    Urobilinogen, UA 0.2    Nitrite,  UA NEG    Leukocytes, UA moderate (2+) (A) Negative  POCT UA - Microscopic Only  Result Value Ref Range   WBC, Ur, HPF, POC 5-10    RBC, urine, microscopic none    Bacteria, U Microscopic 2+    Epithelial cells, urine per micros 5-10    Crystals, Ur, HPF, POC none    Casts, Ur, LPF, POC none    Yeast, UA none       Assessment & Plan:   Problem List Items Addressed This Visit      Cardiovascular and Mediastinum   Hypertension    Improved BP today despite not taking HCTZ (self discontinued) No complications   Plan:  1. Continues Diltiazem-24hr 360mg  daily, self discontinued HCTZ, advised will need to add likely ARB vs CCB at next visit, does not want to resume thiazide 2. Lifestyle Mods - Exercise, Dec salt intake, inc K+ rich vegs 3. Monitor BP at home or at drug store occasionally 4. RTC 1-2 weeks to follow-up BP, did not address as primary concern today due to concern for CAP         Respiratory   CAP (community acquired pneumonia) - Primary     Consistent with CAP (subjective fevers/chills, L-crackles, post-viral URI), has not received udpated PNA vax. No recent hospitalization with IV antibiotics or other risk factors. History of PNA 1-2 years ago, with similar symptoms per patient. Less consistent with influenza. - No hypoxia (98-100% on RA), low grade temp 25F currently - H/o COPD but no wheezing actively, not on maintenance therapy, former smoker  Plan: 1. Start empiric antibiotics - Levaquin 500mg  daily x 7 days (checked last EKG 08/2014 nml QTc 430s) 2. Ordered CXR 2v at [facility] to possibly confirm dx CAP vs rule out complication with pleural effusion 3. No wheezing. No steroid course. 4. RTC about 1-2 weeks follow-up CAP / HTN. Return criteria given if worsening.  UPDATE: - CXR 2v without evidence of any infiltrate. No acute cardiopulm disease - Phoned result to patient. Advised to complete antibiotic course, as CXR findings may lag behind, reassurance no complication or pleural effusion, also Levaquin would cover potential sinusitis.      Relevant Medications   levofloxacin (LEVAQUIN) 500 MG tablet   benzonatate (TESSALON) 100 MG capsule   Other Relevant Orders   DG Chest 2 View (Completed)     Other   History of recurrent UTI (urinary tract infection)    Today with some suprapubic pain and left flank pain (more likely related to coughing, MSK). No dysuria (since resolved). Subjective fevers. Does not clinically seems consistent with Pyelo. - Previous history of recurrent UTI. Last seen 11/2014 treated for UTI with Cipro, had prior failure of Keflex. Last Urine culture no growth.  Plan: 1. No repeat Urine Cx today 2. Treating possible CAP with Levaquin, would cover any potential UTI 3. Follow-up as scheduled with new initial apt [facility] in 1-2 weeks for recurrent UTI         Meds ordered this encounter  Medications  . levofloxacin (LEVAQUIN) 500 MG tablet    Sig: Take 1 tablet (500 mg total) by mouth  daily.    Dispense:  7 tablet    Refill:  0  . benzonatate (TESSALON) 100 MG capsule    Sig: Take 1 capsule (100 mg total) by mouth 2 (two) times daily as needed for cough.    Dispense:  20 capsule    Refill:  0  Follow up plan: Return in about 1 week (around 01/22/2015), or if symptoms worsen or fail to improve, for blood pressure.  [name], [place], PGY-3

## 2015-01-29 ENCOUNTER — Ambulatory Visit: Payer: Medicare HMO | Admitting: Family Medicine

## 2015-03-23 ENCOUNTER — Encounter (HOSPITAL_COMMUNITY): Payer: Self-pay | Admitting: Nurse Practitioner

## 2015-03-23 ENCOUNTER — Emergency Department (HOSPITAL_COMMUNITY)
Admission: EM | Admit: 2015-03-23 | Discharge: 2015-03-23 | Disposition: A | Discharge status: Home / Self Care | Payer: Medicare HMO | Attending: Emergency Medicine | Admitting: Emergency Medicine

## 2015-03-23 ENCOUNTER — Emergency Department (HOSPITAL_COMMUNITY): Payer: Medicare HMO

## 2015-03-23 DIAGNOSIS — Z79899 Other long term (current) drug therapy: Secondary | ICD-10-CM | POA: Diagnosis not present

## 2015-03-23 DIAGNOSIS — R079 Chest pain, unspecified: Secondary | ICD-10-CM

## 2015-03-23 DIAGNOSIS — Z7982 Long term (current) use of aspirin: Secondary | ICD-10-CM | POA: Insufficient documentation

## 2015-03-23 DIAGNOSIS — R000 Tachycardia, unspecified: Secondary | ICD-10-CM | POA: Diagnosis present

## 2015-03-23 DIAGNOSIS — Z87891 Personal history of nicotine dependence: Secondary | ICD-10-CM | POA: Diagnosis not present

## 2015-03-23 DIAGNOSIS — Z7951 Long term (current) use of inhaled steroids: Secondary | ICD-10-CM | POA: Diagnosis not present

## 2015-03-23 DIAGNOSIS — Z8719 Personal history of other diseases of the digestive system: Secondary | ICD-10-CM | POA: Insufficient documentation

## 2015-03-23 DIAGNOSIS — I10 Essential (primary) hypertension: Secondary | ICD-10-CM | POA: Diagnosis not present

## 2015-03-23 DIAGNOSIS — Z8589 Personal history of malignant neoplasm of other organs and systems: Secondary | ICD-10-CM | POA: Insufficient documentation

## 2015-03-23 DIAGNOSIS — J449 Chronic obstructive pulmonary disease, unspecified: Secondary | ICD-10-CM | POA: Diagnosis not present

## 2015-03-23 DIAGNOSIS — E876 Hypokalemia: Secondary | ICD-10-CM | POA: Diagnosis not present

## 2015-03-23 DIAGNOSIS — R002 Palpitations: Secondary | ICD-10-CM | POA: Diagnosis not present

## 2015-03-23 LAB — BASIC METABOLIC PANEL
ANION GAP: 12 (ref 5–15)
BUN: 12 mg/dL (ref 6–20)
CHLORIDE: 103 mmol/L (ref 101–111)
CO2: 29 mmol/L (ref 22–32)
Calcium: 9.2 mg/dL (ref 8.9–10.3)
Creatinine, Ser: 0.82 mg/dL (ref 0.44–1.00)
Glucose, Bld: 119 mg/dL — ABNORMAL HIGH (ref 65–99)
Potassium: 3.1 mmol/L — ABNORMAL LOW (ref 3.5–5.1)
SODIUM: 144 mmol/L (ref 135–145)

## 2015-03-23 LAB — CBC
HCT: 39 % (ref 36.0–46.0)
HEMOGLOBIN: 12.9 g/dL (ref 12.0–15.0)
MCH: 26.9 pg (ref 26.0–34.0)
MCHC: 33.1 g/dL (ref 30.0–36.0)
MCV: 81.4 fL (ref 78.0–100.0)
Platelets: 180 10*3/uL (ref 150–400)
RBC: 4.79 MIL/uL (ref 3.87–5.11)
RDW: 14.4 % (ref 11.5–15.5)
WBC: 7.6 10*3/uL (ref 4.0–10.5)

## 2015-03-23 LAB — I-STAT TROPONIN, ED
TROPONIN I, POC: 0 ng/mL (ref 0.00–0.08)
Troponin i, poc: 0.01 ng/mL (ref 0.00–0.08)

## 2015-03-23 MED ORDER — POTASSIUM CHLORIDE CRYS ER 20 MEQ PO TBCR
40.0000 meq | EXTENDED_RELEASE_TABLET | Freq: Once | ORAL | Status: AC
  Administered 2015-03-23: 40 meq via ORAL
  Filled 2015-03-23: qty 2

## 2015-03-23 MED ORDER — POTASSIUM CHLORIDE CRYS ER 20 MEQ PO TBCR: 40.0000 meq | EXTENDED_RELEASE_TABLET | Freq: Two times a day (BID) | ORAL | Status: DC

## 2015-03-23 NOTE — ED Provider Notes (Signed)
CSN: [id number]     Arrival date & time 03/23/15  1228 History   First MD Initiated Contact with Patient 03/23/15 1436     Chief Complaint  Patient presents with  . Tachycardia     (Consider location/radiation/quality/duration/timing/severity/associated sxs/prior Treatment) Patient is a 70 y.o. female presenting with palpitations.  Palpitations Palpitations quality:  Regular Timing:  Constant Chronicity:  Recurrent Relieved by:  Nothing Worsened by:  Nothing Ineffective treatments:  None tried Associated symptoms: no back pain, no chest pain, no cough and no shortness of breath     Past Medical History  Diagnosis Date  . Hypertension 1978  . Asthma 2003  . COPD (chronic obstructive pulmonary disease) ([place]) 2003  . Cancer ([place]) 2006  . GERD (gastroesophageal reflux disease) 2003  . Migraine headache 2008   Past Surgical History  Procedure Laterality Date  . Abdominal hysterectomy  1989  . Cholecystectomy  1985  . Breast surgery    . Rotator cuff repair  2007   Family History  Problem Relation Age of Onset  . Heart failure Father   . Cancer Mother   . Kidney failure Brother   . Hypertension Brother   . Heart disease Sister   . Stroke Sister   . Stroke Sister    Social History  Substance Use Topics  . Smoking status: Former Smoker    Start date: 05/16/1973    Quit date: 05/16/2001  . Smokeless tobacco: Never Used  . Alcohol Use: No   OB History    No data available     Review of Systems  Constitutional: Negative for fever.  HENT: Negative for congestion and facial swelling.   Eyes: Negative for discharge and redness.  Respiratory: Negative for cough and shortness of breath.   Cardiovascular: Positive for palpitations. Negative for chest pain and leg swelling.  Gastrointestinal: Negative for abdominal pain and abdominal distention.  Endocrine: Negative for polydipsia.  Genitourinary: Negative for dysuria.  Musculoskeletal: Negative for back pain.  Skin:  Negative for wound.  Neurological: Negative for headaches.  All other systems reviewed and are negative.     Allergies  Ivp dye; Lisinopril; Lortab; Milk-related compounds; Sodium hypochlorite; Sulfa antibiotics; and Adhesive  Home Medications   Prior to Admission medications   Medication Sig Start Date End Date Taking? Authorizing Provider  aspirin EC 81 MG tablet Take 1 tablet (81 mg total) by mouth daily. 11/12/14  Yes Alexander Devin Going, DO  diltiazem (CARDIZEM CD) 360 MG 24 hr capsule Take 1 capsule (360 mg total) by mouth daily. 11/11/14  Yes Alexander Devin Going, DO  docusate sodium (COLACE) 100 MG capsule Take 200 mg by mouth 2 (two) times daily as needed for constipation.   Yes Historical Provider, MD  fluticasone (FLONASE) 50 MCG/ACT nasal spray Place 1 spray into the nose 2 (two) times daily.   Yes Historical Provider, MD  polyethylene glycol (MIRALAX / GLYCOLAX) packet Take 17 g by mouth daily. 11/27/12  Yes Dorian Heckle, MD  senna (SENOKOT) 8.6 MG tablet Take 2 tablets by mouth 2 (two) times daily.   Yes Historical Provider, MD  albuterol (PROVENTIL HFA;VENTOLIN HFA) 108 (90 BASE) MCG/ACT inhaler Inhale 2 puffs into the lungs every 6 (six) hours as needed for wheezing. Reported on 03/23/2015    Historical Provider, MD  FLUoxetine (PROZAC) 20 MG capsule Take 20 mg by mouth daily. Reported on 03/23/2015    Historical Provider, MD  hydrochlorothiazide (HYDRODIURIL) 25 MG tablet Take 25 mg by mouth daily.  Reported on 03/23/2015    Historical Provider, MD  potassium chloride SA (K-DUR,KLOR-CON) 20 MEQ tablet Take 2 tablets (40 mEq total) by mouth 2 (two) times daily. 03/23/15   Merrily Pew, MD   BP 165/81 mmHg  Pulse 65  Temp(Src) 98.9 F (37.2 C) (Oral)  Resp 21  SpO2 99% Physical Exam  Constitutional: She is oriented to person, place, and time. She appears well-developed and well-nourished.  HENT:  Head: Normocephalic and atraumatic.  Neck: Normal range of motion.   Cardiovascular: Normal rate and regular rhythm.   Pulmonary/Chest: Effort normal and breath sounds normal. No stridor. No respiratory distress.  Abdominal: Soft. She exhibits no distension.  Musculoskeletal: Normal range of motion. She exhibits no edema.  Neurological: She is alert and oriented to person, place, and time.  Nursing note and vitals reviewed.   ED Course  Procedures (including critical care time) Labs Review Labs Reviewed  BASIC METABOLIC PANEL - Abnormal; Notable for the following:    Potassium 3.1 (*)    Glucose, Bld 119 (*)    All other components within normal limits  CBC  URINALYSIS, ROUTINE W REFLEX MICROSCOPIC (NOT AT [facility])  Randolm Idol, ED  Randolm Idol, ED    Imaging Review Dg Chest 2 View  03/23/2015  CLINICAL DATA:  Tachycardia and short of breath EXAM: CHEST  2 VIEW COMPARISON:  01/15/2015 FINDINGS: Right mastectomy. Lungs are clear without infiltrate or effusion. No mass or lung nodule. Heart size within normal limits. No change from the prior study. IMPRESSION: No active cardiopulmonary disease. Electronically Signed   By: Franchot Gallo M.D.   On: 03/23/2015 13:34   I have personally reviewed and evaluated these images and lab results as part of my medical decision-making.   EKG Interpretation   Date/Time:  Sunday March 23 2015 12:37:16 EST Ventricular Rate:  82 PR Interval:  142 QRS Duration: 82 QT Interval:  372 QTC Calculation: 434 R Axis:   37 Text Interpretation:  Normal sinus rhythm Nonspecific T wave abnormality  Abnormal ECG Confirmed by Malik Ruffino MD, Corene Cornea [phone]) on 03/23/2015 4:05:41 PM      MDM   Final diagnoses:  Chest pain, unspecified chest pain type  Hypokalemia  Palpitations    70 yo w/ palpitations. Possibly related to hypokalemia. No e/o ACS or abnormal rhythms here. Doubt vtach or other more serious causes, will have her follow up with her PCP to be evaluated for holter monitor, otherwise replace potassium and  get a recheck in a week.     Merrily Pew, MD 03/23/15 [phone]

## 2015-03-23 NOTE — ED Notes (Addendum)
Sh ec/o racing heart and "double beats" since yesterday, its making her feel tired, weak and sob. shes also had a headache and she states for several days shes felt like someone is wrapping rubber bands around her L arm and leg. She is A&Ox4, resp e/u

## 2015-03-23 NOTE — ED Notes (Signed)
Pt placed into gown and on to monitor upon arrival to room. Pt monitored by blood pressure, pulse ox, and 5 lead.  

## 2015-03-23 NOTE — ED Notes (Signed)
Pt. Ambulated in hallway. Pt. O2 sat. Stayed in upper 90's and heart rate did not exceed 86bpm.

## 2015-04-15 ENCOUNTER — Ambulatory Visit (INDEPENDENT_AMBULATORY_CARE_PROVIDER_SITE_OTHER): Payer: Medicare HMO | Admitting: Family Medicine

## 2015-04-15 ENCOUNTER — Encounter: Payer: Self-pay | Admitting: Family Medicine

## 2015-04-15 VITALS — BP 140/86 | HR 77 | Temp 98.6°F | Ht 66.0 in | Wt 140.0 lb

## 2015-04-15 DIAGNOSIS — Z1159 Encounter for screening for other viral diseases: Secondary | ICD-10-CM | POA: Diagnosis not present

## 2015-04-15 DIAGNOSIS — J309 Allergic rhinitis, unspecified: Secondary | ICD-10-CM | POA: Diagnosis not present

## 2015-04-15 DIAGNOSIS — R002 Palpitations: Secondary | ICD-10-CM | POA: Diagnosis not present

## 2015-04-15 DIAGNOSIS — C50911 Malignant neoplasm of unspecified site of right female breast: Secondary | ICD-10-CM

## 2015-04-15 DIAGNOSIS — E876 Hypokalemia: Secondary | ICD-10-CM

## 2015-04-15 DIAGNOSIS — C50912 Malignant neoplasm of unspecified site of left female breast: Secondary | ICD-10-CM

## 2015-04-15 MED ORDER — FLUTICASONE PROPIONATE 50 MCG/ACT NA SUSP: 2.0000 | Freq: Every day | NASAL | Status: DC

## 2015-04-15 MED ORDER — LORATADINE 10 MG PO TABS: 10.0000 mg | ORAL_TABLET | Freq: Every day | ORAL | Status: DC

## 2015-04-15 NOTE — Progress Notes (Signed)
Subjective:    Patient ID: Kathryn [name], female    DOB: 1945/11/08, 70 y.o.   MRN: [id number]  Kathryn [name] is a 70 y.o. female presenting on 04/15/2015 for ED Follow-up  HPI  ED FOLLOW-UP, TACHYCARDIA / PALPITATIONS / HYPOKALEMIA: - Patient describes initial episode on 03/23/15 while at church, with symptoms heart racing with palpitations, acute onset while not exertion, associated with dizziness, generalized weakness, with chest heaviness but not chest pain. She rested but did not pass out and denied syncope, fall, or head injury. Went to ED on 03/23/15, evaluated, had EKG (with NSR, no acute ischemic ST-T changes), hypokalemia to 3.1, replaced, given inc K rx to 40 mEq BID for 1 week, instead of prior 20 mEq daily - Today patient states she is doing well, and has had no further episodes of heart racing and palpitations since 03/23/15. She did not fill the potassium rx, and has continued taking only 20 mEq daily. No other medication changes. Interested in referral to Cardiology as advised by ED for heart monitor. Never established with Cardiology, previously followed at [facility]. - Denies active CP, SOB, palpitations, lightheadedness, dizziness, DOE, edema  SINUS PRESSURE / HEADACHE / ALLERGIES: - Chronic problem for years, describes recent worsening sinus pressure and headaches 2-3 months ago, no new triggers, but does admit to chronic allergies. Using Flonase most days without relief (not every day, and not 2 sprays). No chronic allergy medicine. - Taking Tylenol Extra Str 500mg  takes BID daily for HA with some relief. Does consume occasional 1-2 cups coffee intermittently - Admits occasional rhinorrhea, watery itchy eyes - Admits outside MRI brain while living in [place], showed a "cyst on sinus", was told this is normal - Denies any fevers/chills, vision changes / loss of vision, nasal purulence, productive cough  History of Breast Cancer - Last follow-up on 11/12/14 at new patient visit, previously  treated in [place], history of Left Breast CA 2006 (sp lumpectomy and radiation) and Right breast CA 2014 (s/p complete mastectomy and chemotherapy). Referral to General Surgery at that time, has not established yet. - Today asking about mammogram and referrals.  Social History  Substance Use Topics  . Smoking status: Former Smoker    Start date: 05/16/1973    Quit date: 05/16/2001  . Smokeless tobacco: Never Used  . Alcohol Use: No    Review of Systems Per HPI unless specifically indicated above     Objective:    BP 140/86 mmHg  Pulse 77  Temp(Src) 98.6 F (37 C) (Oral)  Ht 5\' 6"  (1.676 m)  Wt 140 lb (63.504 kg)  BMI 22.61 kg/m2  Wt Readings from Last 3 Encounters:  04/15/15 140 lb (63.504 kg)  01/15/15 141 lb (63.957 kg)  11/28/14 142 lb (64.411 kg)    Physical Exam  Constitutional: She appears well-developed and well-nourished. No distress.  Well-appearing, comfortable, cooperative  HENT:  Head: Normocephalic and atraumatic.  Mouth/Throat: Oropharynx is clear and moist.  Frontal sinuses mildly tender to palpation, minimal tenderness over maxillary sinuses. Nares with bilateral turbinate edema R>L without purulence or nasal congestion. Oropharynx clear, without erythema, edema or asymmetry. Bilateral TM's clear without effusion, normal land marks.  Eyes: Conjunctivae are normal. Right eye exhibits no discharge. Left eye exhibits no discharge.  Neck: Normal range of motion. Neck supple. No thyromegaly present.  Cardiovascular: Normal rate, regular rhythm, normal heart sounds and intact distal pulses.   No murmur heard. Pulmonary/Chest: Effort normal and breath sounds normal. No respiratory distress. She has  no wheezes. She has no rales. She exhibits no tenderness.  Lymphadenopathy:    She has no cervical adenopathy.  Neurological: She is alert.  Skin: Skin is warm and dry. No rash noted. She is not diaphoretic.  Nursing note and vitals reviewed.    Results for  orders placed or performed in visit on [id number]  BASIC METABOLIC PANEL WITH GFR  Result Value Ref Range   Sodium 143 135 - 146 mmol/L   Potassium 3.6 3.5 - 5.3 mmol/L   Chloride 105 98 - 110 mmol/L   CO2 29 20 - 31 mmol/L   Glucose, Bld 90 65 - 99 mg/dL   BUN 16 7 - 25 mg/dL   Creat 0.71 0.60 - 0.93 mg/dL   Calcium 8.9 8.6 - 10.4 mg/dL   GFR, Est African American >89 >=60 mL/min   GFR, Est Non African American 86 >=60 mL/min  Hepatitis C antibody  Result Value Ref Range   HCV Ab NEGATIVE NEGATIVE      Assessment & Plan:   Problem List Items Addressed This Visit    Allergic sinusitis    Consistent with chronic rhinosinusitis, likely initially allergic etiology without evidence of acute bacterial infection.  Plan: 1. Reassurance, likely self-limited - no indication for antibiotics at this time 2. Start Claritin 10mg  OTC trial x 1 month, may continue if improved 3. Increase Flonase 2 sprays BID x 1 month, may continue if improved then PRN, refilled 4. Supportive care with nasal saline OTC, hydration 5. Follow-up 1 month, may consider antibiotic course vs allergy referral if still refractory       Relevant Medications   loratadine (CLARITIN) 10 MG tablet   fluticasone (FLONASE) 50 MCG/ACT nasal spray   Breast cancer (HCC)    No symptoms of recurrence. No active treatments. - Hand out given for screening mammogram, if need to change order will stay tuned from breast center - Referral to General Surgery already ordered 10/2014, seems pending, following up with referral coordinator - May need future referral to Oncology      Relevant Medications   loratadine (CLARITIN) 10 MG tablet   Hypokalemia    K 3.1 in ED on 03/23/15, did not take higher dose K  Plan: 1. Re-check BMET, results K 3.6 2. Continue home K 20 mEq daily, no changes      Relevant Orders   BASIC METABOLIC PANEL WITH GFR (Completed)   Palpitations - Primary    Currently stable without active symptoms.  Intermittent episodes, last on 03/23/15 with worst symptoms, evaluated in ED with unremarkable work-up except mild hypoK. Not established with Cardiology.  Plan: 1. Referral to Cardiology for further work-up, consider event monitor 2. Already on CCB with Diltiazem, no change to this today, BP and HR stable 3. Check BMET, K 3.6, continue K 20 mEq daily 4. Follow-up PRN, return criteria given      Relevant Orders   Ambulatory referral to Cardiology    Other Visit Diagnoses    Need for hepatitis C screening test        Relevant Orders    Hepatitis C antibody (Completed)       Meds ordered this encounter  Medications  . loratadine (CLARITIN) 10 MG tablet    Sig: Take 1 tablet (10 mg total) by mouth daily.    Dispense:  30 tablet    Refill:  11  . fluticasone (FLONASE) 50 MCG/ACT nasal spray    Sig: Place 2 sprays into both nostrils daily.  Dispense:  16 g    Refill:  2      Follow up plan: Return in about 4 weeks (around 05/13/2015) for Follow-up chronic allergic sinusitis.  [name], [place], PGY-3

## 2015-04-15 NOTE — Patient Instructions (Signed)
Thank you for coming in to clinic today.  1. For the Heart Palpitations or racing heart episodes, I have referred you to Cardiology, stay tuned for an appointment, if you don't hear back within 1 month you can call our office to check status. - It may be at [facility] or other cardiologist  Low Potassium, we will re-check today, keep taking 20mg  daily, if we need to change it we will contact you.  I think you have Chronic Allergic Sinusitis - I do not think that this is a Bacterial Sinus Infection.  - Recommend to start using Flonase 2 sprays in each nostril for 1 month, may need for longer - Start Loratadine or Claritin 10mg  daily sent rx in - May start using Nasal Saline spray multiple times a day to help flush out congestion and clear sinuses - Improve hydration by drinking plenty of clear fluids (water) to reduce secretions and thin congestion - Congestion draining down throat can cause irritation. May try warm herbal tea with honey, cough drops - Can take Tylenol for headaches  If you develop persistent fever >101F for at least 3 consecutive days, headaches with sinus pain or pressure or persistent earache, please schedule a follow-up evaluation within next few days to week.  Call [phone] to schedule Mammogram  Please schedule a follow-up appointment with Dr Parks Ranger in 1 month to follow-up Sinusitis Allergies  If you have any other questions or concerns, please feel free to call the clinic to contact me. You may also schedule an earlier appointment if necessary.  However, if your symptoms get significantly worse, please go to the Emergency Department to seek immediate medical attention.  [name], [place]

## 2015-04-16 LAB — BASIC METABOLIC PANEL WITH GFR
BUN: 16 mg/dL (ref 7–25)
CHLORIDE: 105 mmol/L (ref 98–110)
CO2: 29 mmol/L (ref 20–31)
Calcium: 8.9 mg/dL (ref 8.6–10.4)
Creat: 0.71 mg/dL (ref 0.60–0.93)
GFR, Est African American: >89 mL/min (ref >=60)
GFR, Est Non African American: 86 mL/min (ref >=60)
Glucose, Bld: 90 mg/dL (ref 65–99)
POTASSIUM: 3.6 mmol/L (ref 3.5–5.3)
SODIUM: 143 mmol/L (ref 135–146)

## 2015-04-16 LAB — HEPATITIS C ANTIBODY: HCV AB: NEGATIVE

## 2015-04-16 NOTE — Assessment & Plan Note (Addendum)
Consistent with chronic rhinosinusitis, likely initially allergic etiology without evidence of acute bacterial infection.  Plan: 1. Reassurance, likely self-limited - no indication for antibiotics at this time 2. Start Claritin 10mg  OTC trial x 1 month, may continue if improved 3. Increase Flonase 2 sprays BID x 1 month, may continue if improved then PRN, refilled 4. Supportive care with nasal saline OTC, hydration 5. Follow-up 1 month, may consider antibiotic course vs allergy referral if still refractory

## 2015-04-16 NOTE — Assessment & Plan Note (Signed)
K 3.1 in ED on 03/23/15, did not take higher dose K  Plan: 1. Re-check BMET, results K 3.6 2. Continue home K 20 mEq daily, no changes

## 2015-04-16 NOTE — Assessment & Plan Note (Signed)
Currently stable without active symptoms. Intermittent episodes, last on 03/23/15 with worst symptoms, evaluated in ED with unremarkable work-up except mild hypoK. Not established with Cardiology.  Plan: 1. Referral to Cardiology for further work-up, consider event monitor 2. Already on CCB with Diltiazem, no change to this today, BP and HR stable 3. Check BMET, K 3.6, continue K 20 mEq daily 4. Follow-up PRN, return criteria given

## 2015-04-16 NOTE — Assessment & Plan Note (Signed)
No symptoms of recurrence. No active treatments. - Hand out given for screening mammogram, if need to change order will stay tuned from breast center - Referral to General Surgery already ordered 10/2014, seems pending, following up with referral coordinator - May need future referral to Oncology

## 2015-04-17 ENCOUNTER — Telehealth: Payer: Self-pay | Admitting: Family Medicine

## 2015-04-17 NOTE — Telephone Encounter (Signed)
Discussed with Orson Eva, [facility] referral coordinator, and confirmed that after I saw patient in 10/2014 for new patient evaluation, she was referred to General Surgery for chronic breast cancer follow-up, and she was initially scheduled for [facility] 11/15/14, however they state that the patient called and canceled the appointment and never re-scheduled.  I contacted patient and LVM to notify her of this update, and gave her [facility] contact number to call and see if she can reschedule this new patient appointment, otherwise, call us back and let us know if she needs a new referral or any other assistance in scheduling with [facility].  [name], [place], PGY-3

## 2015-05-01 ENCOUNTER — Encounter: Payer: Medicare HMO | Admitting: Cardiovascular Disease

## 2015-05-01 NOTE — Progress Notes (Deleted)
Cardiology Office Note   Date:  05/01/2015   ID:  Kathryn [name], DOB 1946-01-30, MRN [id number]  PCP:  [name], DO  Cardiologist:   Sharol Harness, MD   No chief complaint on file.     History of Present Illness: Kathryn [name] is a 70 y.o. female with hypertension, asthma, COPD and AAA who presents for an evaluation of palpitations.  Kathryn [name] was seen in the ED on 2/5 with a complaint of palpitations.  In the ED no arrhythmias were noted but she was found to be hypokalemic with a potassium of 3.1.  Potassium was repleted and she followed up with her PCP, Dr. [name], on 2/28.  At that appointment the palpitations had resolved and her potassium was 3.6.  She was referred to cardiology for further evaluation.     Past Medical History  Diagnosis Date  . Hypertension 1978  . Asthma 2003  . COPD (chronic obstructive pulmonary disease) ([place]) 2003  . Cancer ([employer]) 2006  . GERD (gastroesophageal reflux disease) 2003  . Migraine headache 2008    Past Surgical History  Procedure Laterality Date  . Abdominal hysterectomy  1989  . Cholecystectomy  1985  . Breast surgery    . Rotator cuff repair  2007     Current Outpatient Prescriptions  Medication Sig Dispense Refill  . albuterol (PROVENTIL HFA;VENTOLIN HFA) 108 (90 BASE) MCG/ACT inhaler Inhale 2 puffs into the lungs every 6 (six) hours as needed for wheezing. Reported on 03/23/2015    . aspirin EC 81 MG tablet Take 1 tablet (81 mg total) by mouth daily.    Marland Kitchen diltiazem (CARDIZEM CD) 360 MG 24 hr capsule Take 1 capsule (360 mg total) by mouth daily. 30 capsule 5  . docusate sodium (COLACE) 100 MG capsule Take 200 mg by mouth 2 (two) times daily as needed for constipation.    Marland Kitchen FLUoxetine (PROZAC) 20 MG capsule Take 20 mg by mouth daily. Reported on 03/23/2015    . fluticasone (FLONASE) 50 MCG/ACT nasal spray Place 2 sprays into both nostrils daily. 16 g 2  . hydrochlorothiazide (HYDRODIURIL) 25 MG  tablet Take 25 mg by mouth daily. Reported on 03/23/2015    . loratadine (CLARITIN) 10 MG tablet Take 1 tablet (10 mg total) by mouth daily. 30 tablet 11  . polyethylene glycol (MIRALAX / GLYCOLAX) packet Take 17 g by mouth daily. 14 each 0  . senna (SENOKOT) 8.6 MG tablet Take 2 tablets by mouth 2 (two) times daily.     No current facility-administered medications for this visit.    Allergies:   Ivp dye; Lisinopril; Lortab; Milk-related compounds; Sodium hypochlorite; Sulfa antibiotics; and Adhesive    Social History:  The patient  reports that she quit smoking about 13 years ago. She started smoking about 41 years ago. She has never used smokeless tobacco. She reports that she does not drink alcohol or use illicit drugs.   Family History:  The patient's ***family history includes Cancer in her mother; Heart disease in her sister; Heart failure in her father; Hypertension in her brother; Kidney failure in her brother; Stroke in her sister and sister.    ROS:  Please see the history of present illness.   Otherwise, review of systems are positive for {NONE DEFAULTED:18576::"none"}.   All other systems are reviewed and negative.    PHYSICAL EXAM: VS:  There were no vitals taken for this visit. , BMI There is no weight on file to calculate  BMI. GENERAL:  Well appearing HEENT:  Pupils equal round and reactive, fundi not visualized, oral mucosa unremarkable NECK:  No jugular venous distention, waveform within normal limits, carotid upstroke brisk and symmetric, no bruits, no thyromegaly LYMPHATICS:  No cervical adenopathy LUNGS:  Clear to auscultation bilaterally HEART:  RRR.  PMI not displaced or sustained,S1 and S2 within normal limits, no S3, no S4, no clicks, no rubs, *** murmurs ABD:  Flat, positive bowel sounds normal in frequency in pitch, no bruits, no rebound, no guarding, no midline pulsatile mass, no hepatomegaly, no splenomegaly EXT:  2 plus pulses throughout, no edema, no cyanosis  no clubbing SKIN:  No rashes no nodules NEURO:  Cranial nerves II through XII grossly intact, motor grossly intact throughout PSYCH:  Cognitively intact, oriented to person place and time    EKG:  EKG {ACTION; IS/IS [id number] ordered today. The ekg ordered today demonstrates ***   Recent Labs: 08/20/2014: ALT 13* 03/23/2015: Hemoglobin 12.9; Platelets 180 04/15/2015: BUN 16; Creat 0.71; Potassium 3.6; Sodium 143    Lipid Panel No results found for: CHOL, TRIG, HDL, CHOLHDL, VLDL, LDLCALC, LDLDIRECT    Wt Readings from Last 3 Encounters:  04/15/15 63.504 kg (140 lb)  01/15/15 63.957 kg (141 lb)  11/28/14 64.411 kg (142 lb)      ASSESSMENT AND PLAN:  ***   Current medicines are reviewed at length with the patient today.  The patient {ACTIONS; HAS/DOES NOT HAVE:19233} concerns regarding medicines.  The following changes have been made:  {PLAN; NO CHANGE:13088:s}  Labs/ tests ordered today include: *** No orders of the defined types were placed in this encounter.     Disposition:   FU with ***    This note was written with the assistance of speech recognition software.  Please excuse any transcriptional errors.  Signed, Tamika Nou C. Oval Linsey, MD, [facility]  05/01/2015 7:56 AM    [place]

## 2015-05-03 NOTE — Progress Notes (Signed)
This encounter was created in error - please disregard.

## 2015-05-15 ENCOUNTER — Other Ambulatory Visit: Payer: Self-pay | Admitting: General Surgery

## 2015-05-15 DIAGNOSIS — Z853 Personal history of malignant neoplasm of breast: Secondary | ICD-10-CM

## 2015-05-19 ENCOUNTER — Ambulatory Visit: Payer: Medicare HMO | Admitting: Cardiovascular Disease

## 2015-05-22 ENCOUNTER — Encounter: Payer: Self-pay | Admitting: Nurse Practitioner

## 2015-05-22 ENCOUNTER — Telehealth: Payer: Self-pay | Admitting: Nurse Practitioner

## 2015-05-22 NOTE — Telephone Encounter (Signed)
Spoke with patient re LTS visit with Chestine Spore, NP 11/20/15 @ 1 pm to arrive 12:30 pm. Referred by Dr. Barry Dienes to be seen in [facility] late September or early October. Letter to referring and patient.

## 2015-05-23 NOTE — Telephone Encounter (Signed)
Received multiple referrals from [facility] for patient. Patient already scheduled with [name]. Left message for Shavon at [facility] today re confirming if patient is for [facility] only or if she also needs an appointment with a medical oncologist.

## 2015-06-19 NOTE — Progress Notes (Signed)
This encounter was created in error - please disregard.

## 2015-06-20 ENCOUNTER — Encounter: Payer: 59 | Admitting: Cardiovascular Disease

## 2015-06-24 ENCOUNTER — Other Ambulatory Visit: Payer: Self-pay | Admitting: Family Medicine

## 2015-06-24 DIAGNOSIS — I10 Essential (primary) hypertension: Secondary | ICD-10-CM

## 2015-06-24 MED ORDER — HYDROCHLOROTHIAZIDE 25 MG PO TABS: 25.0000 mg | ORAL_TABLET | Freq: Every day | ORAL | Status: DC

## 2015-06-24 MED ORDER — POTASSIUM CHLORIDE CRYS ER 20 MEQ PO TBCR: 20.0000 meq | EXTENDED_RELEASE_TABLET | Freq: Every day | ORAL | Status: DC

## 2015-06-24 NOTE — Telephone Encounter (Signed)
Refill request for HCTZ and potassium.

## 2015-07-18 ENCOUNTER — Ambulatory Visit
Admission: RE | Admit: 2015-07-18 | Discharge: 2015-07-18 | Disposition: A | Discharge status: Home / Self Care | Payer: Medicare HMO | Source: Ambulatory Visit | Attending: General Surgery | Admitting: General Surgery

## 2015-07-18 DIAGNOSIS — Z853 Personal history of malignant neoplasm of breast: Secondary | ICD-10-CM

## 2015-07-28 NOTE — Progress Notes (Signed)
This encounter was created in error - please disregard.

## 2015-07-29 ENCOUNTER — Encounter: Payer: Self-pay | Admitting: *Deleted

## 2015-07-29 ENCOUNTER — Encounter: Payer: Medicare HMO | Admitting: Cardiovascular Disease

## 2015-08-13 ENCOUNTER — Ambulatory Visit (INDEPENDENT_AMBULATORY_CARE_PROVIDER_SITE_OTHER): Payer: Medicare HMO | Admitting: Family Medicine

## 2015-08-13 ENCOUNTER — Encounter: Payer: Self-pay | Admitting: Family Medicine

## 2015-08-13 VITALS — BP 146/80 | HR 78 | Temp 98.1°F | Ht 66.0 in | Wt 149.4 lb

## 2015-08-13 DIAGNOSIS — I739 Peripheral vascular disease, unspecified: Secondary | ICD-10-CM

## 2015-08-13 DIAGNOSIS — I700 Atherosclerosis of aorta: Secondary | ICD-10-CM | POA: Insufficient documentation

## 2015-08-13 DIAGNOSIS — I70219 Atherosclerosis of native arteries of extremities with intermittent claudication, unspecified extremity: Secondary | ICD-10-CM

## 2015-08-13 NOTE — Assessment & Plan Note (Addendum)
Likely etiology for underlying pain / chronic LE changes and suspicious for vascular claudication. Suspect worsening of known chronic problem, unclear time onset based on history, prior reported positive ABI for PAD per [facility] hospital, inc risk with known AAA, former smoker, HTN  Plan: 1. Advised to schedule ABI with Pharmacy Clinic Dr Valentina Lucks here at [facility] first and then if positive will refer to Vascular Surgery for further evaluation 2. As previously discussed patient would benefit from statin therapy given >10% risk ASCVD score. Not started today, strongly recommend encouraging patient to consider this medicine at next visit after results of ABI reviewed

## 2015-08-13 NOTE — Patient Instructions (Addendum)
Thank you for coming in to clinic today.  1. I am concerned that your blood vessels / arteries may have a blockage on your Left side. We need to do the blood pressure test again (ABI) to see if this is the problem. You are at higher risk for this given your Abdominal Aortic Aneurysm. - Keep taking daily Aspirin - May try to rest and elevate legs as you are to reduce symptoms  Tooth and gums with no evidence of acute infection. Reassurance. If you develop fevers/chills or worsening symptoms may return sooner.  Please schedule a follow-up appointment with Dr Valentina Lucks "Pharmacy [facility] for ABI" anytime within next 2-4 weeks, as soon as possible.  If problem with this test, we will work on referring you to the Vascular Surgeon doctors to discuss more.  If you have any other questions or concerns, please feel free to call the clinic to contact me. You may also schedule an earlier appointment if necessary.  However, if your symptoms get significantly worse, please go to the Emergency Department to seek immediate medical attention.  Kathryn [name], [name]

## 2015-08-13 NOTE — Progress Notes (Signed)
Subjective:    Patient ID: Kathryn [name], female    DOB: March 06, 1945, 70 y.o.   MRN: [id number]  Kathryn [name] is a 70 y.o. female presenting on 08/13/2015 for left leg tightness   HPI  LEFT LEG CRAMP / PAIN: - Reports persistent problem over past several months with left lateral leg cramping, with gradual worsening since onset, feels like a "tight rubber band" around leg and then takes it off, feels like a "cramping" pain with "tightness" mostly constant pain without relief with radiation up to left hip, but some intermittent feeling warm and some localized edema. Worse if prolonged standing or ambulation, limited relief with rest. - No known history of blood clot / DVT or family history of blood clot. - Previously had a ABI test done on lower extremities at [facility] hospital was advised she had a "blockage" in left leg, but no follow-up or treatments done at that time - Known history of AAA, last Abd [place] with diameter 3.9 cm (11/2014) without change - She is not currently established with local Vascular Surgery at this time - Denies numbness, tingling, weakness, low back pain  Recent TOOTH ABSCESS / EXTRACTION: - Reports that a few months ago she had an infected tooth bottom left jaw, she saw dentist they pulled the tooth and gave her antibiotics for a week. Since that time she has felt some general malaise, maybe slightly improved without worsening. - Admits to occasional night sweats, dry cough - Denies any fevers/chills, congestion, nausea, vomiting, unintentional weight loss (has had wt gain), dental pain or swelling, drainage of pus from tooth or gum, syncope / lightheadedness   Social History  Substance Use Topics  . Smoking status: Former Smoker    Start date: 05/16/1973    Quit date: 05/16/2001  . Smokeless tobacco: Never Used  . Alcohol Use: No    Review of Systems Per HPI unless specifically indicated above     Objective:    BP 146/80 mmHg  Pulse 78  Temp(Src) 98.1 F (36.7  C) (Oral)  Ht 5\' 6"  (1.676 m)  Wt 149 lb 6.4 oz (67.767 kg)  BMI 24.13 kg/m2  SpO2 96%  Wt Readings from Last 3 Encounters:  08/13/15 149 lb 6.4 oz (67.767 kg)  04/15/15 140 lb (63.504 kg)  01/15/15 141 lb (63.957 kg)    Physical Exam  Constitutional: She appears well-developed and well-nourished. No distress.  Slightly tired otherwise well appearing, cooperative some discomfort with left leg  HENT:  Mouth/Throat: Oropharynx is clear and moist.  Poor dentition with some frontal lower teeth decay. Missing several teeth bilateral molars s/p extractions. No edema, erythema or drainage of pus in teeth or gums  Cardiovascular: Normal rate, regular rhythm, normal heart sounds and intact distal pulses.   No murmur heard. Pulmonary/Chest: Effort normal and breath sounds normal.  Musculoskeletal:  Lelft lower extremity with some mild soft tissue non pitting edema in anterior-lateral aspect of lower leg without warmth, erythema, tenderness to palpation, no calf pain or edema, foot without edema or pain.  Neurological: She is alert. She has normal reflexes.  Distal sensation to light touch intact  Skin: Skin is warm and dry. She is not diaphoretic.  Nursing note and vitals reviewed.      Assessment & Plan:   Problem List Items Addressed This Visit    Peripheral arterial disease ([place]) - Primary    Likely etiology for underlying pain / chronic LE changes and suspicious for vascular claudication. Suspect worsening of  known chronic problem, unclear time onset based on history, prior reported positive ABI for PAD per [facility] hospital, inc risk with known AAA, former smoker, HTN  Plan: 1. Advised to schedule ABI with Pharmacy Clinic Dr Valentina Lucks here at [facility] first and then if positive will refer to Vascular Surgery for further evaluation 2. As previously discussed patient would benefit from statin therapy given >10% risk ASCVD score. Not started today, strongly recommend encouraging patient to consider  this medicine at next visit after results of ABI reviewed      Atherosclerotic peripheral vascular disease with intermittent claudication ([place])      No evidence active dental infection today. Afebrile. Suspect malaise may be from PAD and other chronic conditions.   No orders of the defined types were placed in this encounter.      Follow up plan: Return in about 2 weeks (around 08/27/2015) for [place] - ABI.  Kathryn [name], [place], PGY-3

## 2015-09-11 ENCOUNTER — Ambulatory Visit: Payer: Medicare HMO | Admitting: Pharmacist

## 2015-09-11 ENCOUNTER — Encounter: Payer: Self-pay | Admitting: Cardiovascular Disease

## 2015-09-11 ENCOUNTER — Ambulatory Visit (INDEPENDENT_AMBULATORY_CARE_PROVIDER_SITE_OTHER): Payer: Medicare HMO | Admitting: Cardiovascular Disease

## 2015-09-11 VITALS — BP 140/78 | HR 67 | Ht 65.5 in | Wt 151.8 lb

## 2015-09-11 DIAGNOSIS — R0602 Shortness of breath: Secondary | ICD-10-CM

## 2015-09-11 DIAGNOSIS — R011 Cardiac murmur, unspecified: Secondary | ICD-10-CM | POA: Diagnosis not present

## 2015-09-11 DIAGNOSIS — I739 Peripheral vascular disease, unspecified: Secondary | ICD-10-CM | POA: Diagnosis not present

## 2015-09-11 DIAGNOSIS — I714 Abdominal aortic aneurysm, without rupture, unspecified: Secondary | ICD-10-CM

## 2015-09-11 DIAGNOSIS — R002 Palpitations: Secondary | ICD-10-CM

## 2015-09-11 DIAGNOSIS — E876 Hypokalemia: Secondary | ICD-10-CM

## 2015-09-11 LAB — BASIC METABOLIC PANEL
BUN: 14 mg/dL (ref 7–25)
CHLORIDE: 101 mmol/L (ref 98–110)
CO2: 33 mmol/L — ABNORMAL HIGH (ref 20–31)
Calcium: 9.1 mg/dL (ref 8.6–10.4)
Creat: 0.9 mg/dL (ref 0.60–0.93)
Glucose, Bld: 97 mg/dL (ref 65–99)
POTASSIUM: 3.5 mmol/L (ref 3.5–5.3)
SODIUM: 144 mmol/L (ref 135–146)

## 2015-09-11 LAB — TSH: TSH: 0.62 m[IU]/L

## 2015-09-11 NOTE — Addendum Note (Signed)
Addended by: Alvina Filbert B on: 09/11/2015 05:42 PM   Modules accepted: Orders

## 2015-09-11 NOTE — Patient Instructions (Signed)
Medication Instructions:  Your physician recommends that you continue on your current medications as directed. Please refer to the Current Medication list given to you today.  Labwork: BMET/TSH AT [facility] ON THE FIRST FLOOR  Testing/Procedures: Your physician has requested that you have an echocardiogram. Echocardiography is a painless test that uses sound waves to create images of your heart. It provides your doctor with information about the size and shape of your heart and how well your heart's chambers and valves are working. This procedure takes approximately one hour. There are no restrictions for this procedure. [place] [address]  Your physician has requested that you have a lexiscan myoview. For further information please visit [email or web address]. Please follow instruction sheet, as given.  Your physician has requested that you have a lower or upper extremity arterial duplex. This test is an ultrasound of the arteries in the legs or arms. It looks at arterial blood flow in the legs and arms. Allow one hour for Lower and Upper Arterial scans. There are no restrictions or special instructions WITH ABI'S  Your physician has requested that you have an abdominal aorta duplex. During this test, an ultrasound is used to evaluate the aorta. Allow 30 minutes for this exam. Do not eat after midnight the day before and avoid carbonated beverages  Follow-Up: Your physician recommends that you schedule a follow-up appointment in: [place]  If you need a refill on your cardiac medications before your next appointment, please call your pharmacy.

## 2015-09-11 NOTE — Progress Notes (Signed)
Cardiology Office Note   Date:  09/11/2015   ID:  Kathryn [name], DOB [birth year], MRN [id number]  PCP:  Kathryn Bing, DO  Cardiologist:   Kathryn Latch, MD   Chief Complaint  Patient presents with  . New Patient (Initial Visit)  . Dizziness    History of Present Illness: Kathryn [name] is a 70 y.o. female with hypertension, diet-controlled diabetes mellitus, asthma, AAA, PAD, COPD and breast cancer who presents for an evaluation of chest heaviness and palpitations.  She reports several months of intermittent chest tightness and palpitations. This typically occurs with exertion and improves with rest.  She feels as though she can feel her heart beating in her ear. It is associated with shortness of breath, diaphoresis, and dizziness but no nausea.  Kathryn [name] also notes pain and swelling in her left lower leg. This typically occurs with walking and also improves with rest.  Kathryn [name] saw her PCP, Kathryn [name], on 6/28.  At that time it was noted that she previously had an abnormal ABI at the [facility]. However, she never had follow-up. Of note, she is also been seen in the emergency department in February with chest discomfort and palpitations. At htat time EKG was unremarkable and potassium was 3.1.  She was previously scheduled for cardiology follow up but did not show up.  Kathryn [name] has a known AAA.  It was 3.9 cm at that time.  She likes to walk for exercise sometimes but not lately due to the hot weather.  She hasn't taken her BP medication yet today. Her blood pressure is typically well-controlled.   Past Medical History:  Diagnosis Date  . Asthma 2003  . Cancer ([place]) 2006  . COPD (chronic obstructive pulmonary disease) ([place]) 2003  . GERD (gastroesophageal reflux disease) 2003  . Hypertension 1978  . Migraine headache 2008    Past Surgical History:  Procedure Laterality Date  . ABDOMINAL HYSTERECTOMY  1989  . BREAST SURGERY    . CHOLECYSTECTOMY  1985  . ROTATOR  CUFF REPAIR  2007     Current Outpatient Prescriptions  Medication Sig Dispense Refill  . albuterol (PROVENTIL HFA;VENTOLIN HFA) 108 (90 BASE) MCG/ACT inhaler Inhale 2 puffs into the lungs every 6 (six) hours as needed for wheezing. Reported on 03/23/2015    . aspirin EC 81 MG tablet Take 1 tablet (81 mg total) by mouth daily.    Marland Kitchen diltiazem (CARDIZEM CD) 360 MG 24 hr capsule Take 1 capsule (360 mg total) by mouth daily. 30 capsule 5  . docusate sodium (COLACE) 100 MG capsule Take 200 mg by mouth 2 (two) times daily as needed for constipation.    Marland Kitchen FLUoxetine (PROZAC) 20 MG capsule Take 20 mg by mouth daily. Reported on 03/23/2015    . fluticasone (FLONASE) 50 MCG/ACT nasal spray Place 2 sprays into both nostrils daily. 16 g 2  . hydrochlorothiazide (HYDRODIURIL) 25 MG tablet Take 1 tablet (25 mg total) by mouth daily. Reported on 03/23/2015 90 tablet 1  . loratadine (CLARITIN) 10 MG tablet Take 1 tablet (10 mg total) by mouth daily. 30 tablet 11  . polyethylene glycol (MIRALAX / GLYCOLAX) packet Take 17 g by mouth daily. 14 each 0  . potassium chloride SA (K-DUR,KLOR-CON) 20 MEQ tablet Take 1 tablet (20 mEq total) by mouth daily. 30 tablet 2  . senna (SENOKOT) 8.6 MG tablet Take 2 tablets by mouth 2 (two) times daily.     No current facility-administered medications for  this visit.     Allergies:   Ivp dye [iodinated diagnostic agents]; Lisinopril; Lortab [hydrocodone-acetaminophen]; Milk-related compounds; Sodium hypochlorite; Sulfa antibiotics; and Adhesive [tape]    Social History:  The patient  reports that she quit smoking about 14 years ago. She started smoking about 42 years ago. She has never used smokeless tobacco. She reports that she does not drink alcohol or use drugs.   Family History:  The patient's family history includes Cancer in her mother; Heart disease in her sister; Heart failure in her father; Hypertension in her brother; Kidney failure in her brother; Stroke in her sister  and sister.    ROS:  Please see the history of present illness.   Otherwise, review of systems are positive for none.   All other systems are reviewed and negative.    PHYSICAL EXAM: VS:  BP 140/78   Pulse 67   Ht 5' 5.5" (1.664 m)   Wt 151 lb 12.8 oz (68.9 kg)   BMI 24.88 kg/m  , BMI Body mass index is 24.88 kg/m. GENERAL:  Well appearing HEENT:  Pupils equal round and reactive, fundi not visualized, oral mucosa unremarkable.  Severe dental caries and poor dentition.  NECK:  No jugular venous distention, waveform within normal limits, carotid upstroke brisk and symmetric, no bruits, no thyromegaly LYMPHATICS:  No cervical adenopathy LUNGS:  Clear to auscultation bilaterally HEART:  RRR.  PMI not displaced or sustained,S1 and S2 within normal limits, no S3, no S4, no clicks, no rubs, II/VI early-peaking systolic murmur at the LUSB ABD:  Flat, positive bowel sounds normal in frequency in pitch, no bruits, no rebound, no guarding, no midline pulsatile mass, no hepatomegaly, no splenomegaly EXT:  2 plus pulses throughout, no edema, no cyanosis no clubbing SKIN:  No rashes no nodules NEURO:  Cranial nerves II through XII grossly intact, motor grossly intact throughout PSYCH:  Cognitively intact, oriented to person place and time   EKG:  EKG is ordered today. The ekg ordered today demonstrates sinus rhythm rate 67 bpm.  LVH.   Recent Labs: 03/23/2015: Hemoglobin 12.9; Platelets 180 04/15/2015: BUN 16; Creat 0.71; Potassium 3.6; Sodium 143    Lipid Panel No results found for: CHOL, TRIG, HDL, CHOLHDL, VLDL, LDLCALC, LDLDIRECT    Wt Readings from Last 3 Encounters:  09/11/15 151 lb 12.8 oz (68.9 kg)  08/13/15 149 lb 6.4 oz (67.8 kg)  04/15/15 140 lb (63.5 kg)      ASSESSMENT AND PLAN:  # Chest tightness: Symptoms are concerning for ischemi she does not think that she will be able to walk on a treadmill. We will obtain a Lexiscan Myoview to evaluate for ischemia.  This chest  tightness is also associated with palpitations.  We will check a BMP, TSH, and free T4.  # Shortness of breath: # Murmur:  Kathryn [name] has and shortness of breath and a systolic murmur on exam. We'll obtain an echo to evaluate valvular heart disease and heart failure.ur:  Ms . [place]   # AAA: Ms. Vause had an abdominal ultrasound in October that revealed a AAA measuring 3.9 cm. Epworth sleepiness scale 15  # Claudication:  Ms. Hanninen has symptoms of claudication and had abnormal ABIs in the past. It has been a while and gait are not available. We will repeat ABIs mlaudication:   # Hypertension: BP is at the upper limit of normal but she has not taken her BP medication yet.  Continue diltiazem and hydrochlorothiazide.   Current medicines  are reviewed at length with the patient today.  The patient does not have concerns regarding medicines.  The following changes have been made:  no change  Labs/ tests ordered today include:   Orders Placed This Encounter  Procedures  . Basic metabolic panel  . TSH  . Myocardial Perfusion Imaging  . EKG 12-Lead  . ECHOCARDIOGRAM COMPLETE     Disposition:   FU with Shaylynn Nulty C. Oval Linsey, MD, [facility] in 1 month.    This note was written with the assistance of speech recognition software.  Please excuse any transcriptional errors.  Signed, Sophiah Rolin C. Oval Linsey, MD, [facility]  09/11/2015 5:26 PM    [place]

## 2015-09-17 ENCOUNTER — Telehealth (HOSPITAL_COMMUNITY): Payer: Self-pay

## 2015-09-17 NOTE — Telephone Encounter (Signed)
Encounter complete. 

## 2015-09-19 ENCOUNTER — Other Ambulatory Visit: Payer: Self-pay | Admitting: Cardiovascular Disease

## 2015-09-19 ENCOUNTER — Ambulatory Visit (HOSPITAL_COMMUNITY)
Admission: RE | Admit: 2015-09-19 | Discharge: 2015-09-19 | Disposition: A | Discharge status: Home / Self Care | Payer: Medicare HMO | Source: Ambulatory Visit | Attending: Cardiology | Admitting: Cardiology

## 2015-09-19 DIAGNOSIS — R0602 Shortness of breath: Secondary | ICD-10-CM | POA: Diagnosis not present

## 2015-09-19 DIAGNOSIS — Z87891 Personal history of nicotine dependence: Secondary | ICD-10-CM | POA: Diagnosis not present

## 2015-09-19 DIAGNOSIS — E119 Type 2 diabetes mellitus without complications: Secondary | ICD-10-CM | POA: Diagnosis not present

## 2015-09-19 DIAGNOSIS — I739 Peripheral vascular disease, unspecified: Secondary | ICD-10-CM

## 2015-09-19 DIAGNOSIS — R0609 Other forms of dyspnea: Secondary | ICD-10-CM | POA: Insufficient documentation

## 2015-09-19 DIAGNOSIS — Z8249 Family history of ischemic heart disease and other diseases of the circulatory system: Secondary | ICD-10-CM | POA: Insufficient documentation

## 2015-09-19 DIAGNOSIS — R9439 Abnormal result of other cardiovascular function study: Secondary | ICD-10-CM | POA: Insufficient documentation

## 2015-09-19 DIAGNOSIS — R002 Palpitations: Secondary | ICD-10-CM | POA: Insufficient documentation

## 2015-09-19 DIAGNOSIS — R079 Chest pain, unspecified: Secondary | ICD-10-CM | POA: Diagnosis not present

## 2015-09-19 DIAGNOSIS — I10 Essential (primary) hypertension: Secondary | ICD-10-CM | POA: Diagnosis not present

## 2015-09-19 LAB — MYOCARDIAL PERFUSION IMAGING
CHL CUP NUCLEAR SRS: 6
CHL CUP NUCLEAR SSS: 9
CSEPPHR: 96 {beats}/min
LVDIAVOL: 83 mL (ref 46–106)
LVSYSVOL: 32 mL
Rest HR: 67 {beats}/min
SDS: 3
TID: 1.15

## 2015-09-19 MED ORDER — REGADENOSON 0.4 MG/5ML IV SOLN
0.4000 mg | Freq: Once | INTRAVENOUS | Status: AC
  Administered 2015-09-19: 0.4 mg via INTRAVENOUS

## 2015-09-19 MED ORDER — AMINOPHYLLINE 25 MG/ML IV SOLN
75.0000 mg | Freq: Once | INTRAVENOUS | Status: AC
  Administered 2015-09-19: 75 mg via INTRAVENOUS

## 2015-09-19 MED ORDER — TECHNETIUM TC 99M TETROFOSMIN IV KIT
10.4000 | PACK | Freq: Once | INTRAVENOUS | Status: AC | PRN
  Administered 2015-09-19: 10.4 via INTRAVENOUS
  Filled 2015-09-19: qty 10

## 2015-09-19 MED ORDER — TECHNETIUM TC 99M TETROFOSMIN IV KIT
28.5000 | PACK | Freq: Once | INTRAVENOUS | Status: AC | PRN
  Administered 2015-09-19: 28.5 via INTRAVENOUS
  Filled 2015-09-19: qty 29

## 2015-09-24 ENCOUNTER — Ambulatory Visit (HOSPITAL_COMMUNITY): Payer: Medicare HMO | Attending: Cardiology

## 2015-09-24 ENCOUNTER — Encounter (INDEPENDENT_AMBULATORY_CARE_PROVIDER_SITE_OTHER): Payer: Self-pay

## 2015-09-24 ENCOUNTER — Other Ambulatory Visit: Payer: Self-pay

## 2015-09-24 DIAGNOSIS — I714 Abdominal aortic aneurysm, without rupture: Secondary | ICD-10-CM | POA: Diagnosis not present

## 2015-09-24 DIAGNOSIS — J449 Chronic obstructive pulmonary disease, unspecified: Secondary | ICD-10-CM | POA: Diagnosis not present

## 2015-09-24 DIAGNOSIS — I119 Hypertensive heart disease without heart failure: Secondary | ICD-10-CM | POA: Insufficient documentation

## 2015-09-24 DIAGNOSIS — R0602 Shortness of breath: Secondary | ICD-10-CM | POA: Insufficient documentation

## 2015-09-24 DIAGNOSIS — E119 Type 2 diabetes mellitus without complications: Secondary | ICD-10-CM | POA: Diagnosis not present

## 2015-09-24 DIAGNOSIS — I358 Other nonrheumatic aortic valve disorders: Secondary | ICD-10-CM | POA: Insufficient documentation

## 2015-09-24 DIAGNOSIS — I340 Nonrheumatic mitral (valve) insufficiency: Secondary | ICD-10-CM | POA: Diagnosis not present

## 2015-09-24 DIAGNOSIS — K219 Gastro-esophageal reflux disease without esophagitis: Secondary | ICD-10-CM | POA: Insufficient documentation

## 2015-09-24 DIAGNOSIS — R011 Cardiac murmur, unspecified: Secondary | ICD-10-CM | POA: Insufficient documentation

## 2015-09-26 ENCOUNTER — Encounter (HOSPITAL_COMMUNITY): Payer: Medicare HMO

## 2015-09-26 ENCOUNTER — Telehealth: Payer: Self-pay | Admitting: Family Medicine

## 2015-09-26 ENCOUNTER — Ambulatory Visit (HOSPITAL_COMMUNITY)
Admission: RE | Admit: 2015-09-26 | Discharge: 2015-09-26 | Disposition: A | Discharge status: Home / Self Care | Payer: Medicare HMO | Source: Ambulatory Visit | Attending: Cardiovascular Disease | Admitting: Cardiovascular Disease

## 2015-09-26 DIAGNOSIS — K219 Gastro-esophageal reflux disease without esophagitis: Secondary | ICD-10-CM | POA: Insufficient documentation

## 2015-09-26 DIAGNOSIS — I739 Peripheral vascular disease, unspecified: Secondary | ICD-10-CM

## 2015-09-26 DIAGNOSIS — R202 Paresthesia of skin: Secondary | ICD-10-CM

## 2015-09-26 DIAGNOSIS — I10 Essential (primary) hypertension: Secondary | ICD-10-CM | POA: Diagnosis not present

## 2015-09-26 DIAGNOSIS — J449 Chronic obstructive pulmonary disease, unspecified: Secondary | ICD-10-CM | POA: Insufficient documentation

## 2015-09-26 NOTE — Telephone Encounter (Signed)
Pt called because she needs a refill on her BP medication called in. She only has 6 pills left. jw

## 2015-09-30 MED ORDER — DILTIAZEM HCL ER COATED BEADS 360 MG PO CP24: 360.0000 mg | ORAL_CAPSULE | Freq: Every day | ORAL | 5 refills | Status: DC

## 2015-09-30 MED ORDER — HYDROCHLOROTHIAZIDE 25 MG PO TABS: 25.0000 mg | ORAL_TABLET | Freq: Every day | ORAL | 1 refills | Status: DC

## 2015-09-30 NOTE — Telephone Encounter (Signed)
Pt called again, needs refill on BP pills. Pt only has 2 left now. Pt is having problems with her heart and the cardiologist does not want her to miss a dose. Please advise. Thanks! ep

## 2015-09-30 NOTE — Telephone Encounter (Signed)
BP meds refilled. Patient to follow up with PCP.  Derl Barrow, RN

## 2015-10-12 NOTE — Progress Notes (Signed)
Cardiology Office Note   Date:  10/13/2015   ID:  Kathryn [name], DOB [birth year], MRN [id number]  PCP:  Kathryn Bing, DO  Cardiologist:   Skeet Latch, MD   Chief Complaint  Patient presents with  . Follow-up    sob; when walking and doing any activities. cramping in left foot going up left leg.     History of Present Illness: Kathryn [name] is a 70 y.o. female with hypertension, diet-controlled diabetes mellitus, asthma, AAA, PAD, COPD and breast cancer who presents for follow up.  Kathryn [name] was seen 09/11/15 with a report of several months of intermittent exertional chest tightness and palpitations. She was referred for [facility] that revealed LVEF 61% with T wave inversions in the inferolateral waves as well as a large, fixed apical, mid-apical inferior and apical inferolateral walls.  There were no wall motion abnormality.  She also had an echo 09/2015 that revealed LVEF 60-65% with grade 1 diastolic dysfunction and mild mitral regurgitation.  She had ABIs on 09/26/15 that were unremarkable.  Kathryn [name] Reports increased shortness of breath when she exerts herself. She denies lower extremity edema, orthopnea, or PND. However, when she tries to walk for even short distances she starts to feel short of breath. She denies any associated chest pain, nausea, or diaphoresis. She has a 15 pack year smoking history and quit smoking 10 years ago. She states that the reason she stop smoking and is because she was told she had early signs of COPD. She also notes cramps in bilateral legs that are constant.  Kathryn [name] checks her blood pressure daily. In the morning when she wakes up it is around 110s-70s.  Later in the day it is 140/80s.   Past Medical History:  Diagnosis Date  . Asthma 2003  . Cancer ([place]) 2006  . COPD (chronic obstructive pulmonary disease) ([place]) 2003  . GERD (gastroesophageal reflux disease) 2003  . Hypertension 1978  . Migraine headache 2008    Past  Surgical History:  Procedure Laterality Date  . ABDOMINAL HYSTERECTOMY  1989  . BREAST SURGERY    . CHOLECYSTECTOMY  1985  . ROTATOR CUFF REPAIR  2007     Current Outpatient Prescriptions  Medication Sig Dispense Refill  . albuterol (PROVENTIL HFA;VENTOLIN HFA) 108 (90 BASE) MCG/ACT inhaler Inhale 2 puffs into the lungs every 6 (six) hours as needed for wheezing. Reported on 03/23/2015    . aspirin EC 81 MG tablet Take 1 tablet (81 mg total) by mouth daily.    Marland Kitchen diltiazem (CARDIZEM CD) 360 MG 24 hr capsule Take 1 capsule (360 mg total) by mouth daily. 30 capsule 5  . docusate sodium (COLACE) 100 MG capsule Take 200 mg by mouth 2 (two) times daily as needed for constipation.    Marland Kitchen FLUoxetine (PROZAC) 20 MG capsule Take 20 mg by mouth daily. Reported on 03/23/2015    . fluticasone (FLONASE) 50 MCG/ACT nasal spray Place 2 sprays into both nostrils daily. 16 g 2  . hydrochlorothiazide (HYDRODIURIL) 25 MG tablet Take 1 tablet (25 mg total) by mouth daily. Reported on 03/23/2015 90 tablet 1  . loratadine (CLARITIN) 10 MG tablet Take 1 tablet (10 mg total) by mouth daily. 30 tablet 11  . polyethylene glycol (MIRALAX / GLYCOLAX) packet Take 17 g by mouth daily. 14 each 0  . potassium chloride SA (K-DUR,KLOR-CON) 20 MEQ tablet Take 2 tablets (40 mEq total) by mouth daily. 180 tablet 3  . senna (SENOKOT) 8.6  MG tablet Take 2 tablets by mouth 2 (two) times daily.     No current facility-administered medications for this visit.     Allergies:   Ivp dye [iodinated diagnostic agents]; Lisinopril; Lortab [hydrocodone-acetaminophen]; Milk-related compounds; Sodium hypochlorite; Sulfa antibiotics; and Adhesive [tape]    Social History:  The patient  reports that she quit smoking about 14 years ago. She started smoking about 42 years ago. She has never used smokeless tobacco. She reports that she does not drink alcohol or use drugs.   Family History:  The patient's family history includes Cancer in her mother;  Heart disease in her sister; Heart failure in her father; Hypertension in her brother; Kidney failure in her brother; Stroke in her sister and sister.    ROS:  Please see the history of present illness.   Otherwise, review of systems are positive for none.   All other systems are reviewed and negative.    PHYSICAL EXAM: VS:  BP 135/87   Pulse 75   Ht 5\' 5"  (1.651 m)   Wt 155 lb (70.3 kg)   BMI 25.79 kg/m  , BMI Body mass index is 25.79 kg/m. GENERAL:  Well appearing HEENT:  Pupils equal round and reactive, fundi not visualized, oral mucosa unremarkable.  Severe dental caries and poor dentition.  NECK:  No jugular venous distention, waveform within normal limits, carotid upstroke brisk and symmetric, no bruits, no thyromegaly LYMPHATICS:  No cervical adenopathy LUNGS:  Clear to auscultation bilaterally HEART:  RRR.  PMI not displaced or sustained,S1 and S2 within normal limits, no S3, no S4, no clicks, no rubs, II/VI early-peaking systolic murmur at the LUSB ABD:  Flat, positive bowel sounds normal in frequency in pitch, no bruits, no rebound, no guarding, no midline pulsatile mass, no hepatomegaly, no splenomegaly EXT:  2 plus pulses throughout, no edema, no cyanosis no clubbing SKIN:  No rashes no nodules NEURO:  Cranial nerves II through XII grossly intact, motor grossly intact throughout PSYCH:  Cognitively intact, oriented to person place and time   EKG:  EKG is not ordered today. The ekg ordered today demonstrates sinus rhythm rate 67 bpm.  LVH.  Echo 09/24/15: Study Conclusions  - Left ventricle: The cavity size was normal. There was severe   focal basal and mild concentric hypertrophy. Systolic function   was normal. The estimated ejection fraction was in the range of   60% to 65%. Wall motion was normal; there were no regional wall   motion abnormalities. There was an increased relative   contribution of atrial contraction to ventricular filling.   Doppler parameters are  consistent with abnormal left ventricular   relaxation (grade 1 diastolic dysfunction). - Aortic valve: Trileaflet; normal thickness, mildly calcified   leaflets. - Mitral valve: There was mild regurgitation.  Lexiscan Myoview 09/19/15:  The left ventricular ejection fraction is normal (55-65%).  Nuclear stress EF: 61%.  T wave inversion was noted during stress in the III, aVF, II, V6 and V5 leads.  There was no ST segment deviation noted during stress.  Defect 1: There is a large defect of moderate severity present in the mid inferior, mid inferolateral, apical inferior, apical lateral and apex location.   Large size, moderate intensity fixed apical, apical inferior and apical lateral perfusion defect - this could represent scar or artifact. LVEF is normal, however, at 61% with normal wall motion. This is an intermediate risk study.   Recent Labs: 03/23/2015: Hemoglobin 12.9; Platelets 180 09/11/2015: BUN 14; Creat  0.90; Potassium 3.5; Sodium 144; TSH 0.62    Lipid Panel No results found for: CHOL, TRIG, HDL, CHOLHDL, VLDL, LDLCALC, LDLDIRECT    Wt Readings from Last 3 Encounters:  10/13/15 155 lb (70.3 kg)  09/19/15 151 lb (68.5 kg)  09/11/15 151 lb 12.8 oz (68.9 kg)      ASSESSMENT AND PLAN:  # Chest tightness: Symptoms Have resolved and stress test was negative.   # Shortness of breath: Echo was unremarkable and she continues to report shortness of breath. She does have a smoking history. She does not have any audible wheezing on exam. We will refer her to pulmonary for evaluation of her shortness of breath.  # AAA: Kathryn [name] had an abdominal ultrasound in 11/2015 that revealed a AAA measuring 3.9 cm.  we will repeat this at her follow-up appointment.   # LE Cramping: Kathryn [name] reports cramping in bilateral legs.  she had ABIs this month that were unremarkable and has palpable pulses bilaterally. Her potassium is typically either low or low normal. We will increase  her potassium supplementation from 20 mEq to 40 mEq.   # Hypertension: Blood pressure is well-controlled. Continue diltiazem and HCTZ.   Current medicines are reviewed at length with the patient today.  The patient does not have concerns regarding medicines.  The following changes have been made:  no change  Labs/ tests ordered today include:   Orders Placed This Encounter  Procedures  . Ambulatory referral to Pulmonology     Disposition:   FU with Kathryn Salzman C. Oval Linsey, MD, [facility] in 6 months.    This note was written with the assistance of speech recognition software.  Please excuse any transcriptional errors.  Signed, Ulanda Tackett C. Oval Linsey, MD, [facility]  10/13/2015 8:58 AM    [facility]

## 2015-10-13 ENCOUNTER — Ambulatory Visit (INDEPENDENT_AMBULATORY_CARE_PROVIDER_SITE_OTHER): Payer: Medicare HMO | Admitting: Cardiovascular Disease

## 2015-10-13 ENCOUNTER — Encounter: Payer: Self-pay | Admitting: Cardiovascular Disease

## 2015-10-13 VITALS — BP 135/87 | HR 75 | Ht 65.0 in | Wt 155.0 lb

## 2015-10-13 DIAGNOSIS — R0602 Shortness of breath: Secondary | ICD-10-CM | POA: Diagnosis not present

## 2015-10-13 DIAGNOSIS — I10 Essential (primary) hypertension: Secondary | ICD-10-CM | POA: Diagnosis not present

## 2015-10-13 MED ORDER — POTASSIUM CHLORIDE CRYS ER 20 MEQ PO TBCR: 40.0000 meq | EXTENDED_RELEASE_TABLET | Freq: Every day | ORAL | 3 refills | Status: DC

## 2015-10-13 NOTE — Patient Instructions (Addendum)
Medication Instructions:  INCREASE YOUR POTASSIUM TO 2 TABLETS DAILY   Labwork: NONE  Testing/Procedures: NONE  Follow-Up: Your physician wants you to follow-up in: [place] will receive a reminder letter in the mail two months in advance. If you don't receive a letter, please call our office to schedule the follow-up appointment.  Any Other Special Instructions Will Be Listed Below (If Applicable). You have been referred to Pulmonary. If you do not hear from their office you may contact them directly at [phone]   If you need a refill on your cardiac medications before your next appointment, please call your pharmacy.

## 2015-11-03 ENCOUNTER — Ambulatory Visit (INDEPENDENT_AMBULATORY_CARE_PROVIDER_SITE_OTHER)
Admission: RE | Admit: 2015-11-03 | Discharge: 2015-11-03 | Disposition: A | Discharge status: Home / Self Care | Payer: Medicare HMO | Source: Ambulatory Visit | Attending: Internal Medicine | Admitting: Internal Medicine

## 2015-11-03 ENCOUNTER — Encounter: Payer: Self-pay | Admitting: Internal Medicine

## 2015-11-03 ENCOUNTER — Other Ambulatory Visit (INDEPENDENT_AMBULATORY_CARE_PROVIDER_SITE_OTHER): Payer: Medicare HMO

## 2015-11-03 ENCOUNTER — Ambulatory Visit (INDEPENDENT_AMBULATORY_CARE_PROVIDER_SITE_OTHER): Payer: Medicare HMO | Admitting: Internal Medicine

## 2015-11-03 VITALS — BP 122/84 | HR 74 | Ht 65.0 in | Wt 153.0 lb

## 2015-11-03 DIAGNOSIS — R0600 Dyspnea, unspecified: Secondary | ICD-10-CM

## 2015-11-03 DIAGNOSIS — E876 Hypokalemia: Secondary | ICD-10-CM

## 2015-11-03 DIAGNOSIS — J9611 Chronic respiratory failure with hypoxia: Secondary | ICD-10-CM

## 2015-11-03 DIAGNOSIS — R0609 Other forms of dyspnea: Secondary | ICD-10-CM

## 2015-11-03 DIAGNOSIS — J9612 Chronic respiratory failure with hypercapnia: Secondary | ICD-10-CM | POA: Diagnosis not present

## 2015-11-03 LAB — CBC WITH DIFFERENTIAL/PLATELET
BASOS ABS: 0 10*3/uL (ref 0.0–0.1)
BASOS PCT: 0.4 % (ref 0.0–3.0)
EOS ABS: 0 10*3/uL (ref 0.0–0.7)
Eosinophils Relative: 0.5 % (ref 0.0–5.0)
HCT: 38.8 % (ref 36.0–46.0)
Hemoglobin: 12.9 g/dL (ref 12.0–15.0)
LYMPHS ABS: 2.3 10*3/uL (ref 0.7–4.0)
LYMPHS PCT: 23 % (ref 12.0–46.0)
MCHC: 33.3 g/dL (ref 30.0–36.0)
MCV: 80.6 fl (ref 78.0–100.0)
MONO ABS: 0.8 10*3/uL (ref 0.1–1.0)
Monocytes Relative: 8.2 % (ref 3.0–12.0)
NEUTROS ABS: 6.7 10*3/uL (ref 1.4–7.7)
NEUTROS PCT: 67.9 % (ref 43.0–77.0)
PLATELETS: 223 10*3/uL (ref 150.0–400.0)
RBC: 4.82 Mil/uL (ref 3.87–5.11)
RDW: 14.2 % (ref 11.5–15.5)
WBC: 9.9 10*3/uL (ref 4.0–10.5)

## 2015-11-03 LAB — SEDIMENTATION RATE: Sed Rate: 18 mm/hr (ref 0–30)

## 2015-11-03 LAB — BASIC METABOLIC PANEL
BUN: 12 mg/dL (ref 6–23)
CALCIUM: 9 mg/dL (ref 8.4–10.5)
CO2: 36 mEq/L — ABNORMAL HIGH (ref 19–32)
CREATININE: 0.85 mg/dL (ref 0.40–1.20)
Chloride: 97 mEq/L (ref 96–112)
GFR: 84.58 mL/min (ref >60.00)
GLUCOSE: 102 mg/dL — AB (ref 70–99)
Potassium: 2.9 mEq/L — ABNORMAL LOW (ref 3.5–5.1)
SODIUM: 141 meq/L (ref 135–145)

## 2015-11-03 LAB — BRAIN NATRIURETIC PEPTIDE: Pro B Natriuretic peptide (BNP): 26 pg/mL (ref 0.0–100.0)

## 2015-11-03 MED ORDER — PANTOPRAZOLE SODIUM 40 MG PO TBEC: 40.0000 mg | DELAYED_RELEASE_TABLET | Freq: Every day | ORAL | 2 refills | Status: DC

## 2015-11-03 MED ORDER — FAMOTIDINE 20 MG PO TABS: ORAL_TABLET | ORAL | Status: DC

## 2015-11-03 NOTE — Progress Notes (Signed)
Subjective:    Patient ID: Kathryn [name], female    DOB: 1945-03-13,    MRN: [id number]  HPI   39 yobf  [facility]  previously rx with albuterol that didn't really help and worse x late spring 2017 with neg cards eval so referred to pulmonary clinic 11/03/2015 by Dr   Yisroel Ramming at [facility] with nl spirometry on initial eval    11/03/2015 1st [facility] office visit/ Kathryn [name]   Chief Complaint  Patient presents with  . Advice Only    C/o SOB w/ exertion, chest heaviness, dry cough x 1 month.    doe x room to room comfortable sitting still and sleeping assoc with dry cough   No obvious other patterns in day to day or daytime variabilty or assoc cp or  subjective wheeze overt sinus or hb symptoms. No unusual exp hx or h/o childhood pna/ asthma or knowledge of premature birth.  Sleeping ok without nocturnal  or early am exacerbation  of respiratory  c/o's or need for noct saba. Also denies any obvious fluctuation of symptoms with weather or environmental changes or other aggravating or alleviating factors except as outlined above   Current Medications, Allergies, Complete Past Medical History, Past Surgical History, Family History, and Social History were reviewed in [employer] record.            Review of Systems  Constitutional: Negative for chills, fever and unexpected weight change.  HENT: Negative for congestion, dental problem, ear pain, nosebleeds, postnasal drip, rhinorrhea, sinus pressure, sneezing, sore throat, trouble swallowing and voice change.   Eyes: Negative for visual disturbance.  Respiratory: Positive for cough and shortness of breath. Negative for choking.   Cardiovascular: Positive for palpitations. Negative for chest pain and leg swelling.  Gastrointestinal: Negative for abdominal pain, diarrhea and vomiting.  Genitourinary: Negative for difficulty urinating.  Musculoskeletal: Negative for arthralgias.  Skin:  Positive for rash.  Neurological: Negative for tremors, syncope and headaches.  Hematological: Does not bruise/bleed easily.       Objective:   Physical Exam   amb somber bf nad  Wt Readings from Last 3 Encounters:  11/03/15 153 lb (69.4 kg)  10/13/15 155 lb (70.3 kg)  09/19/15 151 lb (68.5 kg)    Vital signs reviewed   HEENT: not top teeth/ bottom teeth poor dentition, turbinates, and oropharynx. Nl external ear canals without cough reflex   NECK :  without JVD/Nodes/TM/ nl carotid upstrokes bilaterally   LUNGS: no acc muscle use,  Nl contour chest which is clear to A and P bilaterally without cough on insp or exp maneuvers   CV:  RRR  no s3 or murmur or increase in P2, no edema   ABD:  soft and nontender with nl inspiratory excursion in the supine position. No bruits or organomegaly, bowel sounds nl  MS:  Nl gait/ ext warm without deformities, calf tenderness, cyanosis or clubbing No obvious joint restrictions   SKIN: warm and dry without lesions    NEURO:  alert, depressed affect, nl sensorium with  no motor deficits     CXR PA and Lateral:   11/03/2015 :    I personally reviewed images and agree with radiology impression as follows:    mod severe kyphosis, o/w wnl    Labs ordered/ reviewed:      Chemistry      Component Value Date/Time   NA 141 11/03/2015 1652   K 2.9 (L)  11/03/2015 1652   CL 97 11/03/2015 1652   CO2 36 (H) 11/03/2015 1652   BUN 12 11/03/2015 1652   CREATININE 0.85 11/03/2015 1652   CREATININE 0.90 09/11/2015 1210      Component Value Date/Time   CALCIUM 9.0 11/03/2015 1652   ALKPHOS 71 08/20/2014 1210   AST 17 08/20/2014 1210   ALT 13 (L) 08/20/2014 1210   BILITOT 0.4 08/20/2014 1210        Lab Results  Component Value Date   WBC 9.9 11/03/2015   HGB 12.9 11/03/2015   HCT 38.8 11/03/2015   MCV 80.6 11/03/2015   PLT 223.0 11/03/2015        Lab Results  Component Value Date   TSH 0.62 09/11/2015     Lab Results    Component Value Date   PROBNP 26.0 11/03/2015       Lab Results  Component Value Date   ESRSEDRATE 18 11/03/2015           Assessment & Plan:

## 2015-11-03 NOTE — Patient Instructions (Signed)
Please remember to go to the lab and x-ray department downstairs for your tests - we will call you with the results when they are available.     Pantoprazole (protonix) 40 mg   Take  30-60 min before first meal of the day and Pepcid (famotidine)  20 mg one @  bedtime until return to office - this is the best way to tell whether stomach acid is contributing to your problem.    GERD (REFLUX)  is an extremely common cause of respiratory symptoms just like yours , many times with no obvious heartburn at all.    It can be treated with medication, but also with lifestyle changes including elevation of the head of your bed (ideally with 6 inch  bed blocks),  Smoking cessation, avoidance of late meals, excessive alcohol, and avoid fatty foods, chocolate, peppermint, colas, red wine, and acidic juices such as orange juice.  NO MINT OR MENTHOL PRODUCTS SO NO COUGH DROPS   USE SUGARLESS CANDY INSTEAD (Jolley ranchers or Stover's or Life Savers) or even ice chips will also do - the key is to swallow to prevent all throat clearing. NO OIL BASED VITAMINS - use powdered substitutes.    Please schedule a follow up office visit in 4 weeks, sooner if needed

## 2015-11-04 ENCOUNTER — Other Ambulatory Visit: Payer: Self-pay | Admitting: Internal Medicine

## 2015-11-04 DIAGNOSIS — R0600 Dyspnea, unspecified: Secondary | ICD-10-CM

## 2015-11-04 DIAGNOSIS — J9612 Chronic respiratory failure with hypercapnia: Secondary | ICD-10-CM

## 2015-11-04 DIAGNOSIS — J9611 Chronic respiratory failure with hypoxia: Secondary | ICD-10-CM | POA: Insufficient documentation

## 2015-11-04 LAB — RESPIRATORY ALLERGY PROFILE REGION II ~~LOC~~
Allergen, A. alternata, m6: <0.1 kU/L
Allergen, Cedar tree, t12: <0.1 kU/L
Allergen, Comm Silver Birch, t9: <0.1 kU/L
Allergen, Mouse Urine Protein, e78: <0.1 kU/L
Allergen, P. notatum, m1: <0.1 kU/L
Aspergillus fumigatus, m3: <0.1 kU/L
Common Ragweed: <0.1 kU/L
IGE (IMMUNOGLOBULIN E), SERUM: 46 kU/L (ref <115)
Pecan/Hickory Tree IgE: <0.1 kU/L
Rough Pigweed  IgE: <0.1 kU/L
Timothy Grass: <0.1 kU/L

## 2015-11-04 NOTE — Assessment & Plan Note (Signed)
11/03/2015   Walked RA  2 laps @ 185 ft each stopped due to  Sob with sats 88% - HCO3  11/03/2015  = 36   She is not restricted or obstructed by spirometry today with nl tsh so not clear why she has resp acidosis unless in compensation for met alkalosis from diuresis but this would not cause her to be sob so doesn't entirely make sense > will f/u once her K is corrected

## 2015-11-04 NOTE — Assessment & Plan Note (Signed)
11/03/2015  Walked RA x 2 laps @ 185 ft each stopped due to fatigue and sob, sats down to 88%    - Spirometry 11/03/2015  wnl with very min curvature   - max rx for GERD initiated 11/03/2015 >>>   Symptoms are markedly disproportionate to objective findings and not clear this is a lung problem but pt does appear to have difficult airway management issues. DDX of  difficult airways management almost all start with A and  include Adherence, Ace Inhibitors, Acid Reflux, Active Sinus Disease, Alpha 1 Antitripsin deficiency, Anxiety masquerading as Airways dz,  ABPA,  Allergy(esp in young), Aspiration (esp in elderly), Adverse effects of meds,  Active smokers, A bunch of PE's (a small clot burden can't cause this syndrome unless there is already severe underlying pulm or vascular dz with poor reserve) plus two Bs  = Bronchiectasis and Beta blocker use..and one C= CHF   Adherence is always the initial "prime suspect" and is a multilayered concern that requires a "trust but verify" approach in every patient - starting with knowing how to use medications, especially inhalers, correctly, keeping up with refills and understanding the fundamental difference between maintenance and prns vs those medications only taken for a very short course and then stopped and not refilled.   ? Acid (or non-acid) GERD > always difficult to exclude as up to 75% of pts in some series report no assoc GI/ Heartburn symptoms> rec max (24h)  acid suppression and diet restrictions/ reviewed and instructions given in writing.   ?allergy/ asthma > asthma very unlikely based on today's spirometry while symptomstic > send allergy profile to be complete   ? Anxiety > usually at the bottom of this list of usual suspects but should be much higher on this pt's based on H and P and note already on psychotropics .   ? chf > excluded with bnp <<100   Have rec slower paced walking until we sort out why she desats with heavy exertion though not  this would not explain her hx of sob room to room   Total time devoted to counseling  = 35/31m review case with pt/ discussion of options/alternatives/ personally creating written instructions  in presence of pt  then going over those specific  Instructions directly with the pt including how to use all of the meds but in particular covering each new medication in detail and the difference between the maintenance/automatic meds and the prns using an action plan format for the latter.

## 2015-11-04 NOTE — Assessment & Plan Note (Signed)
Need to first be sure she's taking her Kdur and if so double the dose for a week and have the bmet repeated as should not get this low on just hydrodiuril low dose.

## 2015-11-04 NOTE — Progress Notes (Signed)
Spoke with pt and notified of results per Dr. Wert. Pt verbalized understanding and denied any questions. 

## 2015-11-05 NOTE — Progress Notes (Signed)
Pt aware.

## 2015-11-05 NOTE — Progress Notes (Signed)
   [facility] Phone: [phone]   Date of Visit: 11/06/2015   HPI:  Kathryn [name] is a 70 y.o. female presenting to clinic today for same day appointment. PCP: [place] Bing, DO Concerns today include: rash under left armpit  - initially started itching and then noticed a rash; symptoms present for 2-3 weeks - no odor, no drainage  - no new deodorant, detergent, fragrances, soaps - no one else has similar rash - no rash anywhere else on the body - no fevers or chills  - has a history of breast cancer s/p mastectomy on the right and lumpectomy on the left and s/p chemo last in 2014 - has not noticed any changes in her breast and denies nipple discharge   ROS: See HPI.  [place]:  History of Breast Cancer   PHYSICAL EXAM: BP 136/87   Pulse 67   Temp 98.6 F (37 C) (Oral)   Wt 157 lb (71.2 kg)   SpO2 99%   BMI 26.13 kg/m  Gen: NAD, pleasant Skin: left arm pit rash as noted below. No axillary or cervical lymphadenopathy     ASSESSMENT/PLAN:  Rash of Left Armpit: Clinically seems consistent with tinea corporis but KOH was negative in clinic. Not consistent with a bacterial infection. Also considered inflammatory breast cancer with her history of breast cancer but clinically is not consistent with this. Will try topical Lotrisone cream BID for possible tinea corporis and follow up in 1 week to see if improving. If not improving would likely benefit from dermatology referral.    Smiley Houseman, MD PGY [place]

## 2015-11-06 ENCOUNTER — Ambulatory Visit (INDEPENDENT_AMBULATORY_CARE_PROVIDER_SITE_OTHER): Payer: Medicare HMO | Admitting: Internal Medicine

## 2015-11-06 ENCOUNTER — Encounter: Payer: Self-pay | Admitting: Internal Medicine

## 2015-11-06 VITALS — BP 136/87 | HR 67 | Temp 98.6°F | Wt 157.0 lb

## 2015-11-06 DIAGNOSIS — R21 Rash and other nonspecific skin eruption: Secondary | ICD-10-CM

## 2015-11-06 LAB — POCT SKIN KOH: SKIN KOH, POC: NEGATIVE

## 2015-11-06 MED ORDER — CLOTRIMAZOLE-BETAMETHASONE 1-0.05 % EX LOTN: TOPICAL_LOTION | Freq: Two times a day (BID) | CUTANEOUS | 0 refills | Status: DC

## 2015-11-06 NOTE — Patient Instructions (Signed)
We will try Lotrisone cream for the rash on your arm pit. Please return in 1 week.

## 2015-11-13 ENCOUNTER — Ambulatory Visit: Payer: Medicare HMO | Admitting: Family Medicine

## 2015-11-19 ENCOUNTER — Encounter: Payer: Self-pay | Admitting: Family Medicine

## 2015-11-19 ENCOUNTER — Ambulatory Visit (INDEPENDENT_AMBULATORY_CARE_PROVIDER_SITE_OTHER): Payer: Medicare HMO | Admitting: Family Medicine

## 2015-11-19 VITALS — BP 138/94 | HR 81 | Temp 98.9°F | Ht 65.0 in | Wt 156.2 lb

## 2015-11-19 DIAGNOSIS — Z87440 Personal history of urinary (tract) infections: Secondary | ICD-10-CM

## 2015-11-19 DIAGNOSIS — R21 Rash and other nonspecific skin eruption: Secondary | ICD-10-CM

## 2015-11-19 DIAGNOSIS — M549 Dorsalgia, unspecified: Secondary | ICD-10-CM | POA: Diagnosis not present

## 2015-11-19 LAB — POCT UA - MICROSCOPIC ONLY

## 2015-11-19 LAB — POCT URINALYSIS DIPSTICK
Bilirubin, UA: NEGATIVE
Glucose, UA: NEGATIVE
Ketones, UA: NEGATIVE
Leukocytes, UA: NEGATIVE
Nitrite, UA: NEGATIVE
PH UA: 5.5
PROTEIN UA: NEGATIVE
Spec Grav, UA: 1.02
UROBILINOGEN UA: 0.2

## 2015-11-19 NOTE — Patient Instructions (Signed)
It was a pleasure to meet you today. Please see below to review our plan for today's visit.  1. Your rash has improved. Please continue using the Lotrisone twice per day until it is gone.  2. Your urine sample did not show signs concerning for infection or stones however I want to see you again in 1-2 weeks for reassessment of your urinary and back pain. 3. We will obtain a urine culture and I will notify you of results and whether we need to start antibiotics. 4. Please ask the front desk to schedule an appointment with me in 2 weeks, preferably Monday.  Please call the clinic at [phone] if your symptoms worsen or you have any concerns. It was my pleasure to see you. -- Harriet Butte, [place], PGY-1

## 2015-11-19 NOTE — Assessment & Plan Note (Addendum)
H/o multiple UTI, last on 11/2015. Symptoms of increased urinary urgency and frequency with suprapubic tenderness concerning for UTI. Urine dip neg for leuks, nitrite, with trace RBC from lysis. Does endorse CVA tenderness on left concerning for pyelonephritis but no h/o fevers or nausea/vomiting. Concerns for UTI with false neg urine dip. Possibly cystitis given history. Concerning uncomplicated pyelonephritis. Will have low threshold for evaluation if worsens. - Obtained UA, will follow up with results - Holding abx, if positive, consider Cipro given h/o response - Visit on 11/2015 indicated urology referral for recurrent UTI but patient never seen, will hold given no symptoms for 1 year, if worsen will reconsider - Patient to return in 1-2 weeks for re-evaluation, will consider renal u/s if no improvement and possibly repeat urinalysis

## 2015-11-19 NOTE — Assessment & Plan Note (Addendum)
Seen on 11/06/15. KOH neg for tinea despite clinical presentation. Tx with Lotrisone BID with significant improvement.  - Continue Lotrisone BID until rash has resolved - Patient will f/u in 1-2 weeks for other complaints and will reassess

## 2015-11-19 NOTE — Progress Notes (Signed)
Subjective:   Patient ID: Kathryn [name]    DOB: September 13, 1945, 70 y.o. female   MRN: [id number]  CC: Rash and concerns for UTI  HPI: Kathryn [name] is a 70 y.o. female who presents to clinic today for f/u concerning L axillary rash and acute symptoms of polyuria and urinary urgency. Problems discussed today are as follows:  1. Rash of Left Axilla: symptoms of pruritis 3 weeks ago without odor or drainage. Was seen at [facility] on 11/06/15 and given Lotrisone cream BID with improvement of rash.   2. Polyuria and Urinary Urgency: states left lower back pain for 1 month followed by polyuria (x3-4/day), urinary urgency, and suprapubic pain of 3 days duration. Denies fevers, nausea or vomiting, chest pain, dyspnea, abdominal pain, or decreased appetite. Concerned about UTI due to h/o history and symptoms.  ROS: See HPI for pertinent ROS.  [place]: Pertinent past medical, surgical, family, and social history were reviewed and updated as appropriate. Smoking status reviewed.  Medications reviewed. Current Outpatient Prescriptions  Medication Sig Dispense Refill  . aspirin EC 81 MG tablet Take 1 tablet (81 mg total) by mouth daily.    . clotrimazole-betamethasone (LOTRISONE) lotion Apply topically 2 (two) times daily. 30 mL 0  . diltiazem (CARDIZEM CD) 360 MG 24 hr capsule Take 1 capsule (360 mg total) by mouth daily. 30 capsule 5  . docusate sodium (COLACE) 100 MG capsule Take 200 mg by mouth 2 (two) times daily as needed for constipation.    . famotidine (PEPCID) 20 MG tablet One at bedtime    . FLUoxetine (PROZAC) 20 MG capsule Take 20 mg by mouth daily. Reported on 03/23/2015    . fluticasone (FLONASE) 50 MCG/ACT nasal spray Place 2 sprays into both nostrils daily. 16 g 2  . hydrochlorothiazide (HYDRODIURIL) 25 MG tablet Take 1 tablet (25 mg total) by mouth daily. Reported on 03/23/2015 90 tablet 1  . loratadine (CLARITIN) 10 MG tablet Take 1 tablet (10 mg total) by mouth daily. 30 tablet 11  . pantoprazole  (PROTONIX) 40 MG tablet Take 1 tablet (40 mg total) by mouth daily. Take 30-60 min before first meal of the day 30 tablet 2  . polyethylene glycol (MIRALAX / GLYCOLAX) packet Take 17 g by mouth daily. 14 each 0  . potassium chloride SA (K-DUR,KLOR-CON) 20 MEQ tablet Take 2 tablets (40 mEq total) by mouth daily. 180 tablet 3  . senna (SENOKOT) 8.6 MG tablet Take 2 tablets by mouth 2 (two) times daily.     No current facility-administered medications for this visit.     Objective:   BP (!) 138/94   Pulse 81   Temp 98.9 F (37.2 C) (Oral)   Ht 5\' 5"  (1.651 m)   Wt 156 lb 3.2 oz (70.9 kg)   BMI 25.99 kg/m  Vitals and nursing note reviewed.  General: well nourished, well developed, in no acute distress with non-toxic appearance HEENT: normocephalic, atraumatic, moist mucous membranes Neck: supple, non-tender without lymphadenopathy CV: regular rate and rhythm without murmurs, rubs, or gallops Lungs: clear to auscultation bilaterally with normal work of breathing Abdomen: soft, non-tender, non-distended, no masses or organomegaly palpable, normoactive bowel sounds GU: suprapubic tenderness Skin: warm, dry, no lesions, cap refill < 2 seconds, left axillary circular macular rash with hyperpigmented ring Extremities: warm and well perfused, normal tone Back: CVA tenderness bilaterally, worse on left, normal spinal curvature, no ecchymosis      Assessment & Plan:   History of recurrent UTI (urinary tract  infection) H/o multiple UTI, last on 11/2015. Symptoms of increased urinary urgency and frequency with suprapubic tenderness concerning for UTI. Urine dip neg for leuks, nitrite, with trace RBC from lysis. Does endorse CVA tenderness on left concerning for pyelonephritis but no h/o fevers or nausea/vomiting. Concerns for UTI with false neg urine dip. Possibly cystitis given history. Concerning uncomplicated pyelonephritis. Will have low threshold for evaluation if worsens. - Obtained UA,  will follow up with results - Holding abx, if positive, consider Cipro given h/o response - Visit on 11/2015 indicated urology referral for recurrent UTI but patient never seen, will hold given no symptoms for 1 year, if worsen will reconsider - Patient to return in 1-2 weeks for re-evaluation, will consider renal u/s if no improvement and possibly repeat urinalysis  Rash Seen on 11/06/15. KOH neg for tinea despite clinical presentation. Tx with Lotrisone BID with significant improvement.  - Continue Lotrisone BID until rash has resolved - Patient will f/u in 1-2 weeks for other complaints and will reassess   Orders Placed This Encounter  Procedures  . Urine culture  . Urinalysis Dipstick  . POCT UA - Microscopic Only   No orders of the defined types were placed in this encounter.   Harriet Butte, [place], PGY-1 11/19/2015 4:12 PM

## 2015-11-20 ENCOUNTER — Encounter: Payer: Medicare HMO | Admitting: Adult Health

## 2015-11-21 LAB — URINE CULTURE: ORGANISM ID, BACTERIA: NO GROWTH

## 2015-12-01 ENCOUNTER — Ambulatory Visit (INDEPENDENT_AMBULATORY_CARE_PROVIDER_SITE_OTHER): Payer: Medicare HMO | Admitting: Family Medicine

## 2015-12-01 ENCOUNTER — Encounter: Payer: Self-pay | Admitting: Family Medicine

## 2015-12-01 ENCOUNTER — Ambulatory Visit: Payer: Medicare HMO | Admitting: Internal Medicine

## 2015-12-01 VITALS — BP 132/84 | HR 78 | Temp 98.3°F | Ht 65.0 in | Wt 159.0 lb

## 2015-12-01 DIAGNOSIS — Z87440 Personal history of urinary (tract) infections: Secondary | ICD-10-CM

## 2015-12-01 DIAGNOSIS — R3915 Urgency of urination: Secondary | ICD-10-CM | POA: Diagnosis not present

## 2015-12-01 DIAGNOSIS — R21 Rash and other nonspecific skin eruption: Secondary | ICD-10-CM

## 2015-12-01 LAB — POCT URINALYSIS DIPSTICK
BILIRUBIN UA: NEGATIVE
GLUCOSE UA: NEGATIVE
Ketones, UA: NEGATIVE
Nitrite, UA: NEGATIVE
Protein, UA: NEGATIVE
RBC UA: NEGATIVE
SPEC GRAV UA: 1.01
Urobilinogen, UA: 0.2
pH, UA: 5.5

## 2015-12-01 LAB — POCT UA - MICROSCOPIC ONLY

## 2015-12-01 MED ORDER — CLOTRIMAZOLE-BETAMETHASONE 1-0.05 % EX LOTN: TOPICAL_LOTION | Freq: Two times a day (BID) | CUTANEOUS | 0 refills | Status: DC

## 2015-12-01 NOTE — Assessment & Plan Note (Signed)
Acute. Previous tx left axillary rash neg KOH but responded to Lotrisone. Patient says similar presentation to previous rash.  - Lotrisone refilled for new right axillary rash - If unresponsive, will consider further work up

## 2015-12-01 NOTE — Progress Notes (Signed)
Subjective:   Patient ID: Kathryn [name]    DOB: 09-08-1945, 70 y.o. female   MRN: [id number]  CC: Urinary Urgency  HPI: Kathryn [name] is a 70 y.o. female who presents to clinic today for chronic urinary urgency and a right-sided axillary rash. Problems discussed today are as follows:  1. Urinary Urgency: ongoing issue for several years. Patient cannot recall onset. Says she has been having suprapubic pain for 2 months. Also endorses left-sided costo-vertebral tenderness. Was seen 2 weeks ago for same complaint with neg urine dip and culture. Denies fevers, chills, change in appetite, nausea, dyspnea, chest pain, change in bowel frequency or consistency, polyuria, dysuria, or urinary incontinence. Patient concerned she has urinary infection given her past diagnosis and treatment of UTIs.  2. Right Axillary Rash: onset 1 day. Itches but non-painful. Says similar characteristics to her previous rash which responded well to [facility]. No other rashes present. Left axillary rash has clear up since her last appointment 2 weeks ago.   ROS: See HPI for pertinent ROS.  [place]: Pertinent past medical, surgical, family, and social history were reviewed and updated as appropriate. Smoking status reviewed.  Medications reviewed. Current Outpatient Prescriptions  Medication Sig Dispense Refill  . aspirin EC 81 MG tablet Take 1 tablet (81 mg total) by mouth daily.    . clotrimazole-betamethasone (LOTRISONE) lotion Apply topically 2 (two) times daily. 30 mL 0  . diltiazem (CARDIZEM CD) 360 MG 24 hr capsule Take 1 capsule (360 mg total) by mouth daily. 30 capsule 5  . docusate sodium (COLACE) 100 MG capsule Take 200 mg by mouth 2 (two) times daily as needed for constipation.    . famotidine (PEPCID) 20 MG tablet One at bedtime    . FLUoxetine (PROZAC) 20 MG capsule Take 20 mg by mouth daily. Reported on 03/23/2015    . fluticasone (FLONASE) 50 MCG/ACT nasal spray Place 2 sprays into both nostrils daily. 16 g 2    . hydrochlorothiazide (HYDRODIURIL) 25 MG tablet Take 1 tablet (25 mg total) by mouth daily. Reported on 03/23/2015 90 tablet 1  . loratadine (CLARITIN) 10 MG tablet Take 1 tablet (10 mg total) by mouth daily. 30 tablet 11  . pantoprazole (PROTONIX) 40 MG tablet Take 1 tablet (40 mg total) by mouth daily. Take 30-60 min before first meal of the day 30 tablet 2  . polyethylene glycol (MIRALAX / GLYCOLAX) packet Take 17 g by mouth daily. 14 each 0  . potassium chloride SA (K-DUR,KLOR-CON) 20 MEQ tablet Take 2 tablets (40 mEq total) by mouth daily. 180 tablet 3  . senna (SENOKOT) 8.6 MG tablet Take 2 tablets by mouth 2 (two) times daily.     No current facility-administered medications for this visit.     Objective:   BP 132/84   Pulse 78   Temp 98.3 F (36.8 C) (Oral)   Ht 5\' 5"  (1.651 m)   Wt 159 lb (72.1 kg)   SpO2 98%   BMI 26.46 kg/m  Vitals and nursing note reviewed.  General: well nourished, well developed, in no acute distress with non-toxic appearance HEENT: normocephalic, atraumatic, moist mucous membranes Neck: supple, non-tender without lymphadenopathy CV: regular rate and rhythm without murmurs, rubs, or gallops Lungs: clear to auscultation bilaterally with normal work of breathing, CVA tenderness bilaterally but worse on left with palpation Abdomen: soft, non-tender, non-distended, no masses or organomegaly palpable, normoactive bowel sounds GU: supra-pubic tenderness over central portion of horizontal hysterectomy scar without hernia Skin: warm, dry, no  lesions, cap refill < 2 seconds, macular hyperpigmented right axillary rash 5 mm in diameter Extremities: warm and well perfused, normal tone      Assessment & Plan:   Urinary urgency Chronic. Unchanged over past few years per patient. Does have a history of UTI treated with Cipro. Urinary dip and micro only significant for small leuks. Urine culture pending. Culture 2 weeks ago neg. SPT over hysterectomy scar but  no hernia appreciated. Left CVA tenderness less concerning for pyleo or stone given non-toxic appearance, afebrile, long duration, and neg urinary dip. - Will await UCx results, Cipro if positive, otherwise hold abx - PT referral for Kegal exercises for pelvic floor strengthening - If patient continue to remain symptomatic despite PT, consider referral to Urology for evaluation - Educated patient on decreasing acidic PO intake to decrease irritation to bladder - Return in 1 month  Rash Acute. Previous tx left axillary rash neg KOH but responded to Lotrisone. Patient says similar presentation to previous rash.  - Lotrisone refilled for new right axillary rash - If unresponsive, will consider further work up  Orders Placed This Encounter  Procedures  . Urine culture  . Ambulatory referral to Physical Therapy    Referral Priority:   Routine    Referral Type:   Physical Medicine    Referral Reason:   Specialty Services Required    Requested Specialty:   Physical Therapy    Number of Visits Requested:   1  . POCT urinalysis dipstick  . POCT UA - Microscopic Only   Meds ordered this encounter  Medications  . clotrimazole-betamethasone (LOTRISONE) lotion    Sig: Apply topically 2 (two) times daily.    Dispense:  30 mL    Refill:  0    Harriet Butte, [place], PGY-1 12/01/2015 5:35 PM

## 2015-12-01 NOTE — Patient Instructions (Signed)
It was a pleasure to meet you today. Please see below to review our plan for today's visit.  1. I will notify you of your urinary culture result. If positive, we will start you on an antibiotic. If negative, there is a low chance of you having an infection. 2. I have sent in a referral for pelvic floor rehab. They will reach out to you to schedule an appointment. These exercises will strengthen your pelvic floor muscle and reduce urinary urgency symptoms. 3. Please continue you Lotrisone cream for you rash. 4. Please call [facility] at [phone] to schedule an appointment for a screening colonoscopy. 6. Return in 1 month.  Please call the clinic at [phone] if your symptoms worsen or you have any concerns. It was my pleasure to see you. -- Harriet Butte, [place], PGY-1  Kegel Exercises The goal of Kegel exercises is to isolate and exercise your pelvic floor muscles. These muscles act as a hammock that supports the rectum, vagina, small intestine, and uterus. As the muscles weaken, the hammock sags and these organs are displaced from their normal positions. Kegel exercises can strengthen your pelvic floor muscles and help you to improve bladder and bowel control, improve sexual response, and help reduce many problems and some discomfort during pregnancy. Kegel exercises can be done anywhere and at any time. HOW TO PERFORM KEGEL EXERCISES 1. Locate your pelvic floor muscles. To do this, squeeze (contract) the muscles that you use when you try to stop the flow of urine. You will feel a tightness in the vaginal area (women) and a tight lift in the rectal area (men and women). 2. When you begin, contract your pelvic muscles tight for 2-5 seconds, then relax them for 2-5 seconds. This is one set. Do 4-5 sets with a short pause in between. 3. Contract your pelvic muscles for 8-10 seconds, then relax them for 8-10 seconds. Do 4-5 sets. If you cannot contract your pelvic  muscles for 8-10 seconds, try 5-7 seconds and work your way up to 8-10 seconds. Your goal is 4-5 sets of 10 contractions each day. Keep your stomach, buttocks, and legs relaxed during the exercises. Perform sets of both short and long contractions. Vary your positions. Perform these contractions 3-4 times per day. Perform sets while you are:   Lying in bed in the morning.  Standing at lunch.  Sitting in the late afternoon.  Lying in bed at night. You should do 40-50 contractions per day. Do not perform more Kegel exercises per day than recommended. Overexercising can cause muscle fatigue. Continue these exercises for for at least 15-20 weeks or as directed by your caregiver.   This information is not intended to replace advice given to you by your health care provider. Make sure you discuss any questions you have with your health care provider.   Document Released: 01/19/2012 Document Revised: 02/22/2014 Document Reviewed: 01/19/2012 Elsevier Interactive Patient Education [employer].

## 2015-12-01 NOTE — Assessment & Plan Note (Signed)
Chronic. Unchanged over past few years per patient. Does have a history of UTI treated with Cipro. Urinary dip and micro only significant for small leuks. Urine culture pending. Culture 2 weeks ago neg. SPT over hysterectomy scar but no hernia appreciated. Left CVA tenderness less concerning for pyleo or stone given non-toxic appearance, afebrile, long duration, and neg urinary dip. - Will await UCx results, Cipro if positive, otherwise hold abx - PT referral for Kegal exercises for pelvic floor strengthening - If patient continue to remain symptomatic despite PT, consider referral to Urology for evaluation - Educated patient on decreasing acidic PO intake to decrease irritation to bladder - Return in 1 month

## 2015-12-02 LAB — URINE CULTURE: Organism ID, Bacteria: NO GROWTH

## 2015-12-03 ENCOUNTER — Encounter: Payer: Self-pay | Admitting: Family Medicine

## 2015-12-05 ENCOUNTER — Telehealth: Payer: Self-pay | Admitting: Family Medicine

## 2015-12-05 NOTE — Telephone Encounter (Signed)
Pt would like lab results. Please advise. Thanks! ep

## 2015-12-08 ENCOUNTER — Telehealth: Payer: Self-pay | Admitting: Adult Health

## 2015-12-08 ENCOUNTER — Telehealth: Payer: Self-pay | Admitting: Family Medicine

## 2015-12-08 NOTE — Telephone Encounter (Signed)
I received a call from Kathryn [name] with questions on where to come for her appt tomorrow.  She tells me she has never been to [facility] before.  I gave her the address of where we are located, as well as parking instructions for valet parking.  Shared with her the process for check-in here at the cancer center and asked that she come between 3:00-3:15 pm tomorrow for her scheduled 3:30pm appointment.  She voiced understanding.  I look forward to seeing her then!  Mike Craze, NP [place] [phone]

## 2015-12-08 NOTE — Telephone Encounter (Signed)
Discussed lab work from last [facility] visit on 10/16. Urine culture showed no growth. Encouraged patient to continue with PT for pelvic floor exercise and f/u. -- Harriet Butte, [place], PGY-1

## 2015-12-09 ENCOUNTER — Encounter: Payer: Medicare HMO | Admitting: Adult Health

## 2015-12-09 NOTE — Progress Notes (Deleted)
[name] [place], [place] [address]  CLINIC:  [name]  REASON FOR CONSULTATION:  Initial visit to [facility] & continued surveillance for h/o bilateral breast cancers  REFERRAL FROM:  Dr. Elissa Lovett [facility]    PRIMARY CARE PROVIDER: Dr. Harriet Butte, DO [phone] (phone) [phone] (fax)   BRIEF ONCOLOGY HISTORY:  (From Dr. 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 -Treated with lumpectomy and adjuvant radiation therapy  -Tried anti-estrogen therapy with Arimidex, but could not tolerate and it was stopped after a short time d/t side effects.   -In 2014, a 1.5 cm mass was noted in the right breast at 11 o'clock position; concern on MRI for multicentric disease.  -Was found to have infiltrating ductal carcinoma, ER-/PR-/HER2+ -Genetic counseling done; BRCA1 & BRCA2 negative.  -Underwent right mastectomy at [facility] in [place], [place].  Final pathology revealed T1cN0 breast cancer in 03/2012. -Received adjuvant chemotherapy with TCH x 6 cycles, with "Taxol held for last cycle."  -Completed 1 year of Herceptin treatments.    REVIEW OF SYSTEMS:   GU: Denies vaginal bleeding, discharge, or dryness.  Breast: Denies any new nodularity, masses, tenderness, nipple changes, or nipple discharge.    A 14-point review of systems was completed and was negative, except as noted above.    PAST MEDICAL/SURGICAL HISTORY:  Past Medical History:  Diagnosis Date  . Asthma 2003  . Cancer ([place]) 2006  . COPD (chronic obstructive pulmonary disease) ([place]) 2003  . GERD (gastroesophageal reflux disease) 2003  . Hypertension 1978  . Migraine headache 2008   Past Surgical History:  Procedure Laterality Date  . ABDOMINAL HYSTERECTOMY  1989  . BREAST SURGERY    . CHOLECYSTECTOMY  1985  . ROTATOR CUFF REPAIR  2007     ALLERGIES:    Allergies  Allergen Reactions  . Ivp Dye [Iodinated Diagnostic Agents] Hives  . Lisinopril Cough  . Lortab [Hydrocodone-Acetaminophen] Itching  . Milk-Related Compounds Other (See Comments)    Stomach pains, gas, bloating  . Sodium Hypochlorite Other (See Comments)    wheezing  . Sulfa Antibiotics Hives  . Adhesive [Tape] Rash     CURRENT MEDICATIONS:  Outpatient Encounter Prescriptions as of 12/09/2015  Medication Sig  . aspirin EC 81 MG tablet Take 1 tablet (81 mg total) by mouth daily.  . clotrimazole-betamethasone (LOTRISONE) lotion Apply topically 2 (two) times daily.  Marland Kitchen diltiazem (CARDIZEM CD) 360 MG 24 hr capsule Take 1 capsule (360 mg total) by mouth daily.  Marland Kitchen docusate sodium (COLACE) 100 MG capsule Take 200 mg by mouth 2 (two) times daily as needed for constipation.  . famotidine (PEPCID) 20 MG tablet One at bedtime  . FLUoxetine (PROZAC) 20 MG capsule Take 20 mg by mouth daily. Reported on 03/23/2015  . fluticasone (FLONASE) 50 MCG/ACT nasal spray Place 2 sprays into both nostrils daily.  . hydrochlorothiazide (HYDRODIURIL) 25 MG tablet Take 1 tablet (25 mg total) by mouth daily. Reported on 03/23/2015  . loratadine (CLARITIN) 10 MG tablet Take 1 tablet (10 mg total) by mouth daily.  . pantoprazole (PROTONIX) 40 MG tablet Take 1 tablet (40 mg total) by mouth daily. Take 30-60 min before first meal of the day  . polyethylene glycol (MIRALAX / GLYCOLAX) packet Take 17 g by mouth daily.  . potassium chloride SA (K-DUR,KLOR-CON) 20 MEQ tablet Take 2 tablets (40 mEq total) by mouth daily.  Marland Kitchen senna (SENOKOT)  8.6 MG tablet Take 2 tablets by mouth 2 (two) times daily.   No facility-administered encounter medications on file as of 12/09/2015.      ONCOLOGIC FAMILY HISTORY:  Family History  Problem Relation Age of Onset  . Cancer Mother   . Heart failure Father   . Kidney failure Brother   . Hypertension Brother   . Heart disease Sister   . Stroke Sister   . Stroke Sister      GENETIC COUNSELING/TESTING: Reportedly BRCA1 & BRCA2 negative; no records available for review.   HEALTH MAINTENANCE:  -Last colonoscopy:  -Last pap smear:  -Vaccinations up-to-date?: -Last physical with PCP:    SOCIAL HISTORY:  Kathryn [name] is /single/married/divorced/widowed/separated and lives alone/with her spouse/family/friend in (city), [place].  She has (#) children and they live in (city).  [name] is currently retired/disabled/working part-time/full-time as ***.  She denies any current or history of tobacco, alcohol, or illicit drug use.     PERFORMANCE STATUS: ***    PHYSICAL EXAMINATION:  Vital Signs: There were no vitals filed for this visit. There were no vitals filed for this visit. General: Well-nourished, well-appearing female in no acute distress.  She is unaccompanied/accompanied in clinic by her ***** today.   HEENT: Head is normocephalic.  Pupils equal and reactive to light. Conjunctivae clear without exudate.  Sclerae anicteric. Oral mucosa is pink, moist.  Oropharynx is pink without lesions or erythema.  Lymph: No cervical, supraclavicular, or infraclavicular lymphadenopathy noted on palpation.  Cardiovascular: Regular rate and rhythm.Marland Kitchen Respiratory: Clear to auscultation bilaterally. Chest expansion symmetric; breathing non-labored.  Breast Exam:  -Left breast: No appreciable masses on palpation. No skin redness, thickening, or peau d'orange appearance; no nipple retraction or nipple discharge; mild distortion in symmetry at previous lumpectomy site***healed scar without erythema or nodularity.  -Right breast: No appreciable masses on palpation. No skin redness, thickening, or peau d'orange appearance; no nipple retraction or nipple discharge; mild distortion in symmetry at previous lumpectomy site***healed scar without erythema or nodularity. -Axilla: No axillary adenopathy bilaterally.  GI: Abdomen soft and round; non-tender, non-distended. Bowel  sounds normoactive. No hepatosplenomegaly.   GU: Deferred.  Neuro: No focal deficits. Steady gait.  Psych: Mood and affect normal and appropriate for situation.  Extremities: No edema. Skin: Warm and dry.  LABORATORY DATA:  None for this visit.   DIAGNOSTIC IMAGING:  Most recent mammogram: Left breast only (07/18/15)    ASSESSMENT AND PLAN:  Ms.. Kathryn [name] is a pleasant 70 y.o. female with history of bilateral breast cancers;  Stage IA (T1cN0M0) left breast invasive ductal carcinoma, ER+/PR+/HER2-, diagnosed in 2009; treated with lumpectomy & adjuvant radiation therapy. Anti-estrogen therapy was attempted with Arimidex, but pt unable to tolerate.  Then in 2014, Ms. 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  She completed 1 year of Herceptin maintenance treatments.   She presents to the [name] Clinic for surveillance and routine follow-up.   1. History of bilateral breast breast cancers:  Ms. Kirkwood is currently clinically and radiographically without evidence of disease or recurrence of breast cancer. She will be due for unilateral left breast diagnostic mammogram in 07/2016; orders placed today. She will follow-up in the survivorship clinic in 1 year with history and physical exam per surveillance protocol. I encouraged her to call me with any questions or concerns that may arise before  that time, and I would be happy to see her sooner if needed.  #. Problem(s) at Visit___________________.  #. Bone Health:  Given Ms. Roldan's age & history of breast cancer, she is at risk for bone demineralization. Her last DEXA scan was on **/**/20**.  In the meantime, she was encouraged to increase her consumption of foods rich in calcium, as well as increase her weight-bearing activities.  She was given education on  specific food and activities to promote bone health.  #. Cancer screening:  Due to Ms. Desire's history and her age, she should receive screening for skin cancers, colon cancer, and gynecologic cancers. She was encouraged to follow-up with her PCP for appropriate cancer screenings.   #. Health maintenance and wellness promotion: Ms. Luttrull was encouraged to consume 5-7 servings of fruits and vegetables per day. She was also encouraged to engage in moderate to vigorous exercise for 30 minutes per day most days of the week. She was instructed to limit her alcohol consumption and continue to abstain from tobacco use.    Dispo:  -Return to cancer center ***  *This patient was seen & examined by Dr. Frederich Cha who agrees with the above plan of care.  Please see his Attending Physician Addendum documentation for additional details.    A total of (#) minutes of face-to-face time was spent with this patient with greater than 50% of that time in counseling and care-coordination.   Mike Craze, NP [name] Program [place] [phone]   Note: PRIMARY CARE PROVIDER [place], [place] [phone]

## 2015-12-15 ENCOUNTER — Ambulatory Visit: Payer: Medicare HMO | Admitting: Internal Medicine

## 2016-01-01 ENCOUNTER — Other Ambulatory Visit: Payer: Self-pay | Admitting: Family Medicine

## 2016-01-01 DIAGNOSIS — I10 Essential (primary) hypertension: Secondary | ICD-10-CM

## 2016-01-01 MED ORDER — DILTIAZEM HCL ER COATED BEADS 360 MG PO CP24: 360.0000 mg | ORAL_CAPSULE | Freq: Every day | ORAL | 5 refills | Status: DC

## 2016-01-01 MED ORDER — HYDROCHLOROTHIAZIDE 25 MG PO TABS: 25.0000 mg | ORAL_TABLET | Freq: Every day | ORAL | 1 refills | Status: DC

## 2016-01-01 NOTE — Telephone Encounter (Signed)
Pt needs a refill on BP medication, pt would like a 90 day supply. Pt would also like to pick this up today since she is out. Pt uses [facility] on [address]. Please advise. Thanks! ep

## 2016-01-02 ENCOUNTER — Telehealth: Payer: Self-pay | Admitting: *Deleted

## 2016-01-02 NOTE — Telephone Encounter (Signed)
Prior Authorization received from [employer] for Diltiazem.  PA form placed in provider box for completion. Derl Barrow, RN

## 2016-01-05 NOTE — Telephone Encounter (Addendum)
PA completed online at www.covermymeds.com. PA pending for Diltiazem.  PA Case [id number]. Review process could take 24-72 hours to complete.  Derl Barrow, RN

## 2016-01-05 NOTE — Telephone Encounter (Signed)
PA for diltiazem was denied via [employer].  Patient must have tried and failed Verapamil ER 24 hr extended release capsule or verapamil Er (SR) extended release tablet.  Denial placed in provider box for review.  Derl Barrow, RN

## 2016-01-06 ENCOUNTER — Telehealth: Payer: Self-pay | Admitting: Family Medicine

## 2016-01-06 DIAGNOSIS — I10 Essential (primary) hypertension: Secondary | ICD-10-CM

## 2016-01-06 MED ORDER — VERAPAMIL HCL ER 120 MG PO TBCR: 120.0000 mg | EXTENDED_RELEASE_TABLET | Freq: Every day | ORAL | 2 refills | Status: DC

## 2016-01-06 NOTE — Telephone Encounter (Signed)
Discussed with pt diltiazem for her HTN which is no longer covered by [facility]. She agreed to try the Verapamil and follow up in the clinic on 01/27/16 @1400 . Rx sent in for Verapamil CR 120 mg QD #90 pills, 2 refills. Pt was told side effects, and to not take diltiazem when she starts verapamil. -- Harriet Butte, [place], PGY-1

## 2016-01-14 ENCOUNTER — Encounter: Payer: Self-pay | Admitting: Family Medicine

## 2016-01-14 ENCOUNTER — Ambulatory Visit (INDEPENDENT_AMBULATORY_CARE_PROVIDER_SITE_OTHER): Payer: Medicare HMO | Admitting: Family Medicine

## 2016-01-14 VITALS — BP 137/92 | HR 86 | Temp 98.6°F | Wt 162.2 lb

## 2016-01-14 DIAGNOSIS — J309 Allergic rhinitis, unspecified: Secondary | ICD-10-CM

## 2016-01-14 DIAGNOSIS — J069 Acute upper respiratory infection, unspecified: Secondary | ICD-10-CM | POA: Diagnosis not present

## 2016-01-14 DIAGNOSIS — R0789 Other chest pain: Secondary | ICD-10-CM

## 2016-01-14 DIAGNOSIS — H6983 Other specified disorders of Eustachian tube, bilateral: Secondary | ICD-10-CM

## 2016-01-14 DIAGNOSIS — B9789 Other viral agents as the cause of diseases classified elsewhere: Secondary | ICD-10-CM

## 2016-01-14 MED ORDER — FLUTICASONE PROPIONATE 50 MCG/ACT NA SUSP: 2.0000 | Freq: Every day | NASAL | 2 refills | Status: DC

## 2016-01-14 NOTE — Patient Instructions (Signed)
Upper Respiratory Infection, Adult Most upper respiratory infections (URIs) are a viral infection of the air passages leading to the lungs. A URI affects the nose, throat, and upper air passages. The most common type of URI is nasopharyngitis and is typically referred to as "the common cold." URIs run their course and usually go away on their own. Most of the time, a URI does not require medical attention, but sometimes a bacterial infection in the upper airways can follow a viral infection. This is called a secondary infection. Sinus and middle ear infections are common types of secondary upper respiratory infections. Bacterial pneumonia can also complicate a URI. A URI can worsen asthma and chronic obstructive pulmonary disease (COPD). Sometimes, these complications can require emergency medical care and may be life threatening. What are the causes? Almost all URIs are caused by viruses. A virus is a type of germ and can spread from one person to another. What increases the risk? You may be at risk for a URI if:  You smoke.  You have chronic heart or lung disease.  You have a weakened defense (immune) system.  You are very young or very old.  You have nasal allergies or asthma.  You work in crowded or poorly ventilated areas.  You work in health care facilities or schools.  What are the signs or symptoms? Symptoms typically develop 2-3 days after you come in contact with a cold virus. Most viral URIs last 7-10 days. However, viral URIs from the influenza virus (flu virus) can last 14-18 days and are typically more severe. Symptoms may include:  Runny or stuffy (congested) nose.  Sneezing.  Cough.  Sore throat.  Headache.  Fatigue.  Fever.  Loss of appetite.  Pain in your forehead, behind your eyes, and over your cheekbones (sinus pain).  Muscle aches.  How is this diagnosed? Your health care provider may diagnose a URI by:  Physical exam.  Tests to check that your  symptoms are not due to another condition such as: ? Strep throat. ? Sinusitis. ? Pneumonia. ? Asthma.  How is this treated? A URI goes away on its own with time. It cannot be cured with medicines, but medicines may be prescribed or recommended to relieve symptoms. Medicines may help:  Reduce your fever.  Reduce your cough.  Relieve nasal congestion.  Follow these instructions at home:  Take medicines only as directed by your health care provider.  Gargle warm saltwater or take cough drops to comfort your throat as directed by your health care provider.  Use a warm mist humidifier or inhale steam from a shower to increase air moisture. This may make it easier to breathe.  Drink enough fluid to keep your urine clear or pale yellow.  Eat soups and other clear broths and maintain good nutrition.  Rest as needed.  Return to work when your temperature has returned to normal or as your health care provider advises. You may need to stay home longer to avoid infecting others. You can also use a face mask and careful hand washing to prevent spread of the virus.  Increase the usage of your inhaler if you have asthma.  Do not use any tobacco products, including cigarettes, chewing tobacco, or electronic cigarettes. If you need help quitting, ask your health care provider. How is this prevented? The best way to protect yourself from getting a cold is to practice good hygiene.  Avoid oral or hand contact with people with cold symptoms.  Wash your   hands often if contact occurs.  There is no clear evidence that vitamin C, vitamin E, echinacea, or exercise reduces the chance of developing a cold. However, it is always recommended to get plenty of rest, exercise, and practice good nutrition. Contact a health care provider if:  You are getting worse rather than better.  Your symptoms are not controlled by medicine.  You have chills.  You have worsening shortness of breath.  You have  brown or red mucus.  You have yellow or brown nasal discharge.  You have pain in your face, especially when you bend forward.  You have a fever.  You have swollen neck glands.  You have pain while swallowing.  You have white areas in the back of your throat. Get help right away if:  You have severe or persistent: ? Headache. ? Ear pain. ? Sinus pain. ? Chest pain.  You have chronic lung disease and any of the following: ? Wheezing. ? Prolonged cough. ? Coughing up blood. ? A change in your usual mucus.  You have a stiff neck.  You have changes in your: ? Vision. ? Hearing. ? Thinking. ? Mood. This information is not intended to replace advice given to you by your health care provider. Make sure you discuss any questions you have with your health care provider. Document Released: 07/28/2000 Document Revised: 10/05/2015 Document Reviewed: 05/09/2013 Elsevier Interactive Patient Education  2017 Elsevier Inc.  

## 2016-01-14 NOTE — Progress Notes (Signed)
   Subjective:   Kathryn [name] is a 70 y.o. female with a history of HTN, PAD, asthma, breast cancer here for cold symptoms  Patient has had symptoms for about 4 days including headaches, nonproductive cough, nasal congestion. She reports she had a fever on the first day for symptoms, but none since. She reports she has intermittent shortness of breath related to her nasal congestion. She says that she is most concerned about muffled sounds and hearing is or she were in a tunnel. She endorses brief episodes of right-sided chest pain that occur when she is coughing a lot at rest the last for only 10 seconds and self resolves.  She is tried taking NyQuil and Tylenol which have not helped her symptoms. She denies abdominal pain, nausea, vomiting, change in bowel habit, urinary symptoms.  Review of Systems:  Per HPI.   Social History: former smoker - quit 20 yrs ago  Objective:  BP (!) 137/92   Pulse 86   Temp 98.6 F (37 C) (Oral)   Wt 162 lb 3.2 oz (73.6 kg)   BMI 26.99 kg/m   Gen:  70 y.o. female in NAD  HEENT: NCAT, MMM, EOMI, PERRL, anicteric sclerae, OP clear, TMs not erythematous with slight bulging bilaterally CV: RRR, no MRG Resp: Non-labored, CTAB, no wheezes noted Abd: Soft, NTND, BS present, no guarding or organomegaly Ext: WWP, no edema MSK: No obvious deformities, gait intact Neuro: Alert and oriented, speech normal     Assessment & Plan:     Kathryn [name] is a 70 y.o. female here for   1. Viral URI - No evidence of acute bacterial infection such as pneumonia - Discussed symptomatic management and typical expected course - Return precautions discussed  2. Eustachian tube dysfunction, bilateral - fluticasone (FLONASE) 50 MCG/ACT nasal spray; Place 2 sprays into both nostrils daily.  Dispense: 16 g; Refill: 2  3. Other chest pain - Atypical in nature and very unlikely to be due to cardiac etiologies - Suspect is related to musculoskeletal pain due to cough - Lung  exam clear, so doubt pneumonia causing pleuritic pain - Strict return precautions for cardiac chest pain discussed    Virginia Crews, MD MPH PGY-3,  [facility] 01/14/2016  10:45 AM

## 2016-01-27 ENCOUNTER — Ambulatory Visit: Payer: Medicare HMO | Admitting: Family Medicine

## 2016-01-27 NOTE — Progress Notes (Deleted)
   Subjective:   Patient ID: Kathryn [name]    DOB: 02/12/1946, 70 y.o. female   MRN: [id number]  CC: ***  HPI: Kathryn [name] is a 70 y.o. female who presents to clinic today ***. Problems discussed today are as follows:  1. ***: ***  2. ***: ***  Verapamil CR 120 mg QD #90 pills, 2 refills Pelvic floor rehab? Lotrisone cream for axillary rash GI for screening colonoscopy Tdap, zostavax, flu, PCV  ROS: Complete ROS performed, see HPI for pertinent ROS.  [place]: Pertinent past medical, surgical, family, and social history were reviewed and updated as appropriate. Smoking status reviewed.  Medications reviewed. Current Outpatient Prescriptions  Medication Sig Dispense Refill  . aspirin EC 81 MG tablet Take 1 tablet (81 mg total) by mouth daily.    . clotrimazole-betamethasone (LOTRISONE) lotion Apply topically 2 (two) times daily. 30 mL 0  . docusate sodium (COLACE) 100 MG capsule Take 200 mg by mouth 2 (two) times daily as needed for constipation.    . famotidine (PEPCID) 20 MG tablet One at bedtime    . FLUoxetine (PROZAC) 20 MG capsule Take 20 mg by mouth daily. Reported on 03/23/2015    . fluticasone (FLONASE) 50 MCG/ACT nasal spray Place 2 sprays into both nostrils daily. 16 g 2  . hydrochlorothiazide (HYDRODIURIL) 25 MG tablet Take 1 tablet (25 mg total) by mouth daily. Reported on 03/23/2015 90 tablet 1  . loratadine (CLARITIN) 10 MG tablet Take 1 tablet (10 mg total) by mouth daily. 30 tablet 11  . pantoprazole (PROTONIX) 40 MG tablet Take 1 tablet (40 mg total) by mouth daily. Take 30-60 min before first meal of the day 30 tablet 2  . polyethylene glycol (MIRALAX / GLYCOLAX) packet Take 17 g by mouth daily. 14 each 0  . potassium chloride SA (K-DUR,KLOR-CON) 20 MEQ tablet Take 2 tablets (40 mEq total) by mouth daily. 180 tablet 3  . senna (SENOKOT) 8.6 MG tablet Take 2 tablets by mouth 2 (two) times daily.    . verapamil (CALAN-SR) 120 MG CR tablet Take 1 tablet (120 mg total) by  mouth at bedtime. 90 tablet 2   No current facility-administered medications for this visit.     Objective:   There were no vitals taken for this visit. Vitals and nursing note reviewed.  General: well nourished, well developed, in no acute distress with non-toxic appearance HEENT: normocephalic, atraumatic, moist mucous membranes Neck: supple, non-tender without lymphadenopathy CV: regular rate and rhythm without murmurs, rubs, or gallops, no lower extremity edema Lungs: clear to auscultation bilaterally with normal work of breathing Abdomen: soft, non-tender, non-distended, no masses or organomegaly palpable, normoactive bowel sounds Skin: warm, dry, no rashes or lesions, cap refill < 2 seconds Extremities: warm and well perfused, normal tone  Assessment & Plan:   No problem-specific Assessment & Plan notes found for this encounter.  No orders of the defined types were placed in this encounter.  No orders of the defined types were placed in this encounter.   Kathryn [name], [place], PGY-1 01/27/2016 12:03 PM

## 2016-01-30 ENCOUNTER — Telehealth: Payer: Self-pay | Admitting: Family Medicine

## 2016-01-30 NOTE — Telephone Encounter (Signed)
Pt's BP medication was recently changed to verapamil 120 mg. Pt states it is not helping BP. BP is running very high. 155/94. Pt would like to know if she could take 2 or what she should do. Pt does not want to end up in the hospital. Please advise. Thanks! ep

## 2016-01-30 NOTE — Telephone Encounter (Signed)
Please have patient schedule an appointment today.  Derl Barrow, RN

## 2016-02-02 ENCOUNTER — Encounter: Payer: Self-pay | Admitting: Internal Medicine

## 2016-02-02 ENCOUNTER — Ambulatory Visit (INDEPENDENT_AMBULATORY_CARE_PROVIDER_SITE_OTHER): Payer: Medicare HMO | Admitting: Internal Medicine

## 2016-02-02 DIAGNOSIS — I10 Essential (primary) hypertension: Secondary | ICD-10-CM

## 2016-02-02 DIAGNOSIS — R059 Cough, unspecified: Secondary | ICD-10-CM

## 2016-02-02 DIAGNOSIS — R05 Cough: Secondary | ICD-10-CM | POA: Diagnosis not present

## 2016-02-02 MED ORDER — DILTIAZEM HCL ER COATED BEADS 360 MG PO CP24: 360.0000 mg | ORAL_CAPSULE | Freq: Every day | ORAL | 0 refills | Status: AC

## 2016-02-02 MED ORDER — BENZONATATE 100 MG PO CAPS: 100.0000 mg | ORAL_CAPSULE | Freq: Two times a day (BID) | ORAL | 0 refills | Status: DC | PRN

## 2016-02-02 MED ORDER — AZITHROMYCIN 250 MG PO TABS: ORAL_TABLET | ORAL | 0 refills | Status: DC

## 2016-02-02 MED ORDER — HYDROCHLOROTHIAZIDE 25 MG PO TABS: 25.0000 mg | ORAL_TABLET | Freq: Every day | ORAL | 1 refills | Status: DC

## 2016-02-02 NOTE — Assessment & Plan Note (Signed)
Discussed with Dr. Erin Hearing, who does not believe it should be an issue to have Diltiazem covered by insurance. Will prescribe Diltiazem again as this has previously controlled her BP. Refill for HCTZ given as being out of medication may have contributed to elevated BP as well. Does not sound like patient has been taking OTC cold medications that would elevate her BP. Patient to return in 1 week for BP follow up.

## 2016-02-02 NOTE — Patient Instructions (Addendum)
Take the Azithromycin to treat your secondary bacterial infection. Please let us know if your cough does not improve with this medication. I have also prescribed Tessalon to help with your cough.   I have re-ordered your Diltiazem. Please stop taking the Verapamil if you are able to get the Diltiazem.

## 2016-02-02 NOTE — Progress Notes (Signed)
Subjective:    Kathryn [name] - 70 y.o. female MRN [id number]  Date of birth: January 07, 1946  HPI  Kathryn [name] is here for SDA for high blood pressure and cough.  Elevated BP: Patient with known h/o HTN. Was switched from Diltiazem to Verapamil recently, approximately 3 weeks ago, due to insurance issues with coverage. Reports that since starting Verapamil her BP has been elevated. She monitors BP most days at home. Yesterday it was 179/99. She denies LE edema. Occasionally has a headache but not the worst headache of her life. She is out of her HCTZ and requests a refill today. Patient also presents with letter from [facility] stating that she could have Diltiazem mail ordered to her. She wants to know why she is receiving offers for mail ordered medication for a drug she was told was no longer covered.   Cough: Has had a cough for almost 4 weeks. It is non-productive and she also has some nasal congestion. She was treated for vial URI at the office a couple of weeks ago. She has been taking Dayquil at home without improvement in her symptoms. She endorses sensation of tightness and difficulty getting a deep breath in. Coughing is worst at night. She has been afebrile. She is a previous smoker. Was told she had COPD many years ago when she went to the [place] but has since seen pulmonology who stated she did not have COPD. She does not use inhaler medications.   -  reports that she quit smoking about 14 years ago. Her smoking use included Cigarettes. She has a 10.00 pack-year smoking history. She has never used smokeless tobacco. - Review of Systems: Per HPI. - Past Medical History: Patient Active Problem List   Diagnosis Date Noted  . Cough 02/02/2016  . Urinary urgency 12/01/2015  . Rash 11/19/2015  . Chronic respiratory failure with hypoxia and hypercapnia ([place]) 11/04/2015  . Dyspnea 11/03/2015  . Atherosclerotic peripheral vascular disease with intermittent claudication ([place]) 08/13/2015  . Palpitations  04/15/2015  . Allergic sinusitis 04/15/2015  . Hypokalemia 04/15/2015  . CAP (community acquired pneumonia) 01/15/2015  . History of recurrent UTI (urinary tract infection) 01/15/2015  . Peripheral arterial disease ([place]) 11/12/2014  . Osteoarthritis, multiple sites 11/12/2014  . History of colonic polyps 11/11/2014  . Protein-calorie malnutrition, severe ([place]) 11/27/2012  . Breast cancer ([place]) 11/24/2012  . AAA (abdominal aortic aneurysm) ([place]) 11/24/2012  . Depression 11/24/2012  . Hypertension   . Asthma    - Medications: reviewed and updated   Objective:   Physical Exam BP (!) 160/92   Pulse 65   Temp 98.3 F (36.8 C) (Oral)   Wt 164 lb 6.4 oz (74.6 kg)   SpO2 99%   BMI 27.36 kg/m  Gen: NAD, alert, cooperative with exam, appears tired  HEENT: NCAT, PERRL, clear conjunctiva, oropharynx clear, supple neck CV: RRR, good S1/S2, no murmur, no LE edema  Resp: wheezing in upper lung fields bilaterally with diminished air movement at the bases, no increased WOB      Assessment & Plan:   Hypertension Discussed with Dr. Erin Hearing, who does not believe it should be an issue to have Diltiazem covered by insurance. Will prescribe Diltiazem again as this has previously controlled her BP. Refill for HCTZ given as being out of medication may have contributed to elevated BP as well. Does not sound like patient has been taking OTC cold medications that would elevate her BP. Patient to return in 1 week for BP follow  up.   Cough Concern for secondary bacterial bronchitis. Had considered a cardiac component such as CHF given patient's weight gain over the past few months. However, patient reports she is gaining weight she had lost during her treatment of breast cancer and she has no edema present on exam. BNP from Sept was normal at 26. Will treat as secondary bacterial bronchitis with Azithromycin. Tessalon given for cough. If patient does not improve with this treatment, would obtain a CXR and  consider repeat BNP.     Phill Myron, D.O. 02/02/2016, 9:17 AM PGY-2, [place]

## 2016-02-02 NOTE — Assessment & Plan Note (Signed)
Concern for secondary bacterial bronchitis. Had considered a cardiac component such as CHF given patient's weight gain over the past few months. However, patient reports she is gaining weight she had lost during her treatment of breast cancer and she has no edema present on exam. BNP from Sept was normal at 26. Will treat as secondary bacterial bronchitis with Azithromycin. Tessalon given for cough. If patient does not improve with this treatment, would obtain a CXR and consider repeat BNP.

## 2016-02-04 ENCOUNTER — Other Ambulatory Visit: Payer: Self-pay | Admitting: *Deleted

## 2016-02-04 NOTE — Telephone Encounter (Signed)
Filled by paper request earlier today to be faxed.

## 2016-02-06 ENCOUNTER — Other Ambulatory Visit: Payer: Self-pay | Admitting: Family Medicine

## 2016-02-06 DIAGNOSIS — R0600 Dyspnea, unspecified: Secondary | ICD-10-CM

## 2016-02-25 ENCOUNTER — Ambulatory Visit: Payer: Medicare HMO | Admitting: Family Medicine

## 2016-05-25 ENCOUNTER — Encounter (HOSPITAL_COMMUNITY): Payer: Self-pay

## 2016-05-25 ENCOUNTER — Observation Stay (HOSPITAL_COMMUNITY)
Admission: EM | Admit: 2016-05-25 | Discharge: 2016-05-26 | Disposition: A | Discharge status: Home / Self Care | Payer: Medicare HMO | Attending: Family Medicine | Admitting: Family Medicine

## 2016-05-25 ENCOUNTER — Emergency Department (HOSPITAL_COMMUNITY): Payer: Medicare HMO

## 2016-05-25 ENCOUNTER — Observation Stay (HOSPITAL_COMMUNITY): Payer: Medicare HMO

## 2016-05-25 DIAGNOSIS — J9611 Chronic respiratory failure with hypoxia: Secondary | ICD-10-CM | POA: Diagnosis not present

## 2016-05-25 DIAGNOSIS — G43909 Migraine, unspecified, not intractable, without status migrainosus: Secondary | ICD-10-CM | POA: Insufficient documentation

## 2016-05-25 DIAGNOSIS — Z6826 Body mass index (BMI) 26.0-26.9, adult: Secondary | ICD-10-CM | POA: Diagnosis not present

## 2016-05-25 DIAGNOSIS — Z66 Do not resuscitate: Secondary | ICD-10-CM | POA: Diagnosis not present

## 2016-05-25 DIAGNOSIS — J9612 Chronic respiratory failure with hypercapnia: Secondary | ICD-10-CM | POA: Insufficient documentation

## 2016-05-25 DIAGNOSIS — R200 Anesthesia of skin: Secondary | ICD-10-CM | POA: Diagnosis present

## 2016-05-25 DIAGNOSIS — Z7951 Long term (current) use of inhaled steroids: Secondary | ICD-10-CM | POA: Insufficient documentation

## 2016-05-25 DIAGNOSIS — Z9181 History of falling: Secondary | ICD-10-CM | POA: Diagnosis not present

## 2016-05-25 DIAGNOSIS — Z7982 Long term (current) use of aspirin: Secondary | ICD-10-CM | POA: Insufficient documentation

## 2016-05-25 DIAGNOSIS — Z87891 Personal history of nicotine dependence: Secondary | ICD-10-CM | POA: Insufficient documentation

## 2016-05-25 DIAGNOSIS — I714 Abdominal aortic aneurysm, without rupture, unspecified: Secondary | ICD-10-CM

## 2016-05-25 DIAGNOSIS — F329 Major depressive disorder, single episode, unspecified: Secondary | ICD-10-CM | POA: Insufficient documentation

## 2016-05-25 DIAGNOSIS — E43 Unspecified severe protein-calorie malnutrition: Secondary | ICD-10-CM | POA: Diagnosis not present

## 2016-05-25 DIAGNOSIS — I700 Atherosclerosis of aorta: Secondary | ICD-10-CM | POA: Insufficient documentation

## 2016-05-25 DIAGNOSIS — R079 Chest pain, unspecified: Principal | ICD-10-CM | POA: Insufficient documentation

## 2016-05-25 DIAGNOSIS — Z87440 Personal history of urinary (tract) infections: Secondary | ICD-10-CM | POA: Diagnosis not present

## 2016-05-25 DIAGNOSIS — K219 Gastro-esophageal reflux disease without esophagitis: Secondary | ICD-10-CM | POA: Insufficient documentation

## 2016-05-25 DIAGNOSIS — Z8249 Family history of ischemic heart disease and other diseases of the circulatory system: Secondary | ICD-10-CM | POA: Insufficient documentation

## 2016-05-25 DIAGNOSIS — K5909 Other constipation: Secondary | ICD-10-CM | POA: Insufficient documentation

## 2016-05-25 DIAGNOSIS — Z853 Personal history of malignant neoplasm of breast: Secondary | ICD-10-CM | POA: Insufficient documentation

## 2016-05-25 DIAGNOSIS — R42 Dizziness and giddiness: Secondary | ICD-10-CM

## 2016-05-25 DIAGNOSIS — E876 Hypokalemia: Secondary | ICD-10-CM | POA: Insufficient documentation

## 2016-05-25 DIAGNOSIS — M5412 Radiculopathy, cervical region: Secondary | ICD-10-CM | POA: Diagnosis not present

## 2016-05-25 DIAGNOSIS — R0789 Other chest pain: Secondary | ICD-10-CM

## 2016-05-25 DIAGNOSIS — J449 Chronic obstructive pulmonary disease, unspecified: Secondary | ICD-10-CM | POA: Insufficient documentation

## 2016-05-25 DIAGNOSIS — I5030 Unspecified diastolic (congestive) heart failure: Secondary | ICD-10-CM | POA: Diagnosis not present

## 2016-05-25 DIAGNOSIS — I70219 Atherosclerosis of native arteries of extremities with intermittent claudication, unspecified extremity: Secondary | ICD-10-CM | POA: Insufficient documentation

## 2016-05-25 DIAGNOSIS — I110 Hypertensive heart disease with heart failure: Secondary | ICD-10-CM | POA: Diagnosis not present

## 2016-05-25 DIAGNOSIS — M1990 Unspecified osteoarthritis, unspecified site: Secondary | ICD-10-CM | POA: Diagnosis not present

## 2016-05-25 DIAGNOSIS — R531 Weakness: Secondary | ICD-10-CM

## 2016-05-25 LAB — DIFFERENTIAL
BASOS ABS: 0 10*3/uL (ref 0.0–0.1)
BASOS PCT: 0 %
EOS ABS: 0 10*3/uL (ref 0.0–0.7)
Eosinophils Relative: 0 %
Lymphocytes Relative: 29 %
Lymphs Abs: 3 10*3/uL (ref 0.7–4.0)
MONO ABS: 0.6 10*3/uL (ref 0.1–1.0)
MONOS PCT: 6 %
NEUTROS ABS: 6.8 10*3/uL (ref 1.7–7.7)
Neutrophils Relative %: 65 %

## 2016-05-25 LAB — CBC
HCT: 38.5 % (ref 36.0–46.0)
Hemoglobin: 12.6 g/dL (ref 12.0–15.0)
MCH: 26.2 pg (ref 26.0–34.0)
MCHC: 32.7 g/dL (ref 30.0–36.0)
MCV: 80 fL (ref 78.0–100.0)
PLATELETS: 218 10*3/uL (ref 150–400)
RBC: 4.81 MIL/uL (ref 3.87–5.11)
RDW: 14.3 % (ref 11.5–15.5)
WBC: 10.5 10*3/uL (ref 4.0–10.5)

## 2016-05-25 LAB — I-STAT TROPONIN, ED
TROPONIN I, POC: 0 ng/mL (ref 0.00–0.08)
TROPONIN I, POC: 0 ng/mL (ref 0.00–0.08)
TROPONIN I, POC: 0 ng/mL (ref 0.00–0.08)

## 2016-05-25 LAB — I-STAT CHEM 8, ED
BUN: 14 mg/dL (ref 6–20)
CHLORIDE: 101 mmol/L (ref 101–111)
Calcium, Ion: 1.1 mmol/L — ABNORMAL LOW (ref 1.15–1.40)
Creatinine, Ser: 0.9 mg/dL (ref 0.44–1.00)
GLUCOSE: 94 mg/dL (ref 65–99)
HEMATOCRIT: 40 % (ref 36.0–46.0)
HEMOGLOBIN: 13.6 g/dL (ref 12.0–15.0)
POTASSIUM: 3.2 mmol/L — AB (ref 3.5–5.1)
Sodium: 140 mmol/L (ref 135–145)
TCO2: 30 mmol/L (ref 0–100)

## 2016-05-25 LAB — COMPREHENSIVE METABOLIC PANEL
ALT: 13 U/L — AB (ref 14–54)
AST: 15 U/L (ref 15–41)
Albumin: 3.9 g/dL (ref 3.5–5.0)
Alkaline Phosphatase: 69 U/L (ref 38–126)
Anion gap: 13 (ref 5–15)
BUN: 12 mg/dL (ref 6–20)
CHLORIDE: 100 mmol/L — AB (ref 101–111)
CO2: 29 mmol/L (ref 22–32)
CREATININE: 0.91 mg/dL (ref 0.44–1.00)
Calcium: 9.4 mg/dL (ref 8.9–10.3)
Glucose, Bld: 96 mg/dL (ref 65–99)
POTASSIUM: 3.3 mmol/L — AB (ref 3.5–5.1)
SODIUM: 142 mmol/L (ref 135–145)
Total Bilirubin: 0.5 mg/dL (ref 0.3–1.2)
Total Protein: 7.3 g/dL (ref 6.5–8.1)

## 2016-05-25 LAB — PROTIME-INR
INR: 1.04
PROTHROMBIN TIME: 13.6 s (ref 11.4–15.2)

## 2016-05-25 LAB — APTT: APTT: 32 s (ref 24–36)

## 2016-05-25 MED ORDER — ASPIRIN 81 MG PO CHEW
162.0000 mg | CHEWABLE_TABLET | Freq: Once | ORAL | Status: AC
  Administered 2016-05-25: 162 mg via ORAL
  Filled 2016-05-25: qty 2

## 2016-05-25 MED ORDER — DOCUSATE SODIUM 100 MG PO CAPS
200.0000 mg | ORAL_CAPSULE | Freq: Every day | ORAL | Status: DC
  Administered 2016-05-26: 200 mg via ORAL
  Filled 2016-05-25: qty 2

## 2016-05-25 MED ORDER — FLUOXETINE HCL 20 MG PO CAPS
20.0000 mg | ORAL_CAPSULE | Freq: Every day | ORAL | Status: DC
  Filled 2016-05-25: qty 1

## 2016-05-25 MED ORDER — PANTOPRAZOLE SODIUM 40 MG PO TBEC
40.0000 mg | DELAYED_RELEASE_TABLET | Freq: Every day | ORAL | Status: DC
  Administered 2016-05-26: 40 mg via ORAL
  Filled 2016-05-25: qty 1

## 2016-05-25 MED ORDER — POTASSIUM CHLORIDE CRYS ER 20 MEQ PO TBCR
40.0000 meq | EXTENDED_RELEASE_TABLET | Freq: Once | ORAL | Status: AC
  Administered 2016-05-25: 40 meq via ORAL
  Filled 2016-05-25: qty 2

## 2016-05-25 MED ORDER — ATORVASTATIN CALCIUM 40 MG PO TABS
40.0000 mg | ORAL_TABLET | Freq: Every day | ORAL | Status: DC
  Filled 2016-05-25: qty 1

## 2016-05-25 MED ORDER — ACETAMINOPHEN 325 MG PO TABS
650.0000 mg | ORAL_TABLET | Freq: Four times a day (QID) | ORAL | Status: DC | PRN
  Administered 2016-05-25 – 2016-05-26 (×3): 650 mg via ORAL
  Filled 2016-05-25 (×3): qty 2

## 2016-05-25 MED ORDER — ONDANSETRON HCL 4 MG/2ML IJ SOLN
4.0000 mg | Freq: Four times a day (QID) | INTRAMUSCULAR | Status: DC | PRN
Start: 2016-05-25 — End: 2016-05-26

## 2016-05-25 MED ORDER — GI COCKTAIL ~~LOC~~: 30.0000 mL | Freq: Four times a day (QID) | ORAL | Status: DC | PRN

## 2016-05-25 MED ORDER — ENOXAPARIN SODIUM 40 MG/0.4ML ~~LOC~~ SOLN
40.0000 mg | SUBCUTANEOUS | Status: DC
  Administered 2016-05-26: 40 mg via SUBCUTANEOUS
  Filled 2016-05-25: qty 0.4

## 2016-05-25 MED ORDER — HYDROCHLOROTHIAZIDE 25 MG PO TABS
25.0000 mg | ORAL_TABLET | Freq: Every day | ORAL | Status: DC
  Administered 2016-05-26: 25 mg via ORAL
  Filled 2016-05-25: qty 1

## 2016-05-25 MED ORDER — SENNA 8.6 MG PO TABS
2.0000 | ORAL_TABLET | Freq: Two times a day (BID) | ORAL | Status: DC
Start: 2016-05-25 — End: 2016-05-26
  Administered 2016-05-26: 17.2 mg via ORAL
  Filled 2016-05-25: qty 2

## 2016-05-25 MED ORDER — ASPIRIN EC 81 MG PO TBEC
81.0000 mg | DELAYED_RELEASE_TABLET | Freq: Every day | ORAL | Status: DC
  Administered 2016-05-26: 81 mg via ORAL
  Filled 2016-05-25: qty 1

## 2016-05-25 MED ORDER — DILTIAZEM HCL ER COATED BEADS 180 MG PO CP24: 360.0000 mg | ORAL_CAPSULE | Freq: Every day | ORAL | Status: DC

## 2016-05-25 MED ORDER — ACETAMINOPHEN 325 MG PO TABS: 650.0000 mg | ORAL_TABLET | ORAL | Status: DC | PRN

## 2016-05-25 NOTE — ED Triage Notes (Signed)
Pt presents to the ed with complaints of heaviness in her chest, numbness in her left arm and leg and a headache that started when she woke up this morning.  Pt states last feeling normal last night before bed at 2230 and waking up with all of these symptoms. pts grip strength is mildly weaker on the left hand side. Complaints of a headache, pt has history of migraines, alert and oriented x 4, ambulatory in triage.

## 2016-05-25 NOTE — ED Notes (Signed)
Pt to xray

## 2016-05-25 NOTE — ED Provider Notes (Signed)
[facility] DEPT Provider Note   CSN: [id number] Arrival date & time: 05/25/16  1142     History   Chief Complaint Chief Complaint  Patient presents with  . Chest Pain  . Numbness    HPI Kathryn [name] is a 71 y.o. female.  The history is provided by the patient.  Chest Pain   This is a new problem. The current episode started 6 to 12 hours ago. The problem occurs constantly. The problem has not changed since onset.The pain is associated with rest. The pain is present in the substernal region. The pain is at a severity of 4/10. The pain is moderate. The quality of the pain is described as heavy and pressure-like. The pain radiates to the left arm. Exacerbated by: Nothing has improved or worsened condition. Associated symptoms include malaise/fatigue and numbness (subjective numbness and tingling to left arm and left leg). Pertinent negatives include no abdominal pain, no back pain, no claudication, no cough, no diaphoresis, no dizziness, no exertional chest pressure, no fever, no headaches, no hemoptysis, no irregular heartbeat, no lower extremity edema, no nausea, no near-syncope, no orthopnea, no palpitations, no PND, no shortness of breath, no sputum production, no syncope, no vomiting and no weakness. She has tried nothing for the symptoms. The treatment provided no relief. Risk factors include being elderly and lack of exercise.  Her past medical history is significant for aneurysm, aortic aneurysm, hyperlipidemia, hypertension and PVD.  Pertinent negatives for past medical history include no recent injury and no seizures.  Procedure history is negative for cardiac catheterization.    Past Medical History:  Diagnosis Date  . Asthma 2003  . Cancer ([place]) 2006  . GERD (gastroesophageal reflux disease) 2003  . Hypertension 1978  . Migraine headache 2008    Patient Active Problem List   Diagnosis Date Noted  . Cough 02/02/2016  . Urinary urgency 12/01/2015  . Rash 11/19/2015    . Chronic respiratory failure with hypoxia and hypercapnia ([place]) 11/04/2015  . Dyspnea 11/03/2015  . Atherosclerotic peripheral vascular disease with intermittent claudication ([place]) 08/13/2015  . Palpitations 04/15/2015  . Allergic sinusitis 04/15/2015  . Hypokalemia 04/15/2015  . CAP (community acquired pneumonia) 01/15/2015  . History of recurrent UTI (urinary tract infection) 01/15/2015  . Peripheral arterial disease ([place]) 11/12/2014  . Osteoarthritis, multiple sites 11/12/2014  . History of colonic polyps 11/11/2014  . Protein-calorie malnutrition, severe ([place]) 11/27/2012  . Breast cancer ([place]) 11/24/2012  . AAA (abdominal aortic aneurysm) ([place]) 11/24/2012  . Depression 11/24/2012  . Hypertension   . Asthma     Past Surgical History:  Procedure Laterality Date  . ABDOMINAL HYSTERECTOMY  1989  . BREAST SURGERY    . CHOLECYSTECTOMY  1985  . ROTATOR CUFF REPAIR  2007    OB History    No data available       Home Medications    Prior to Admission medications   Medication Sig Start Date End Date Taking? Authorizing Provider  aspirin EC 81 MG tablet Take 1 tablet (81 mg total) by mouth daily. 11/12/14   Olin Hauser, DO  azithromycin (ZITHROMAX) 250 MG tablet Take 2 pills on day 1 followed by 1 pill daily x4 days. 02/02/16   Nicolette Bang, DO  benzonatate (TESSALON) 100 MG capsule Take 1 capsule (100 mg total) by mouth 2 (two) times daily as needed for cough. 02/02/16   Nicolette Bang, DO  clotrimazole-betamethasone (LOTRISONE) lotion Apply topically 2 (two) times daily. 12/01/15  [place] Bing, DO  diltiazem (CARDIZEM CD) 360 MG 24 hr capsule Take 1 capsule (360 mg total) by mouth daily. 02/02/16   Nicolette Bang, DO  docusate sodium (COLACE) 100 MG capsule Take 200 mg by mouth 2 (two) times daily as needed for constipation.    Historical Provider, MD  famotidine (PEPCID) 20 MG tablet One at bedtime 11/03/15   Tanda Rockers, MD   FLUoxetine (PROZAC) 20 MG capsule Take 20 mg by mouth daily. Reported on 03/23/2015    Historical Provider, MD  fluticasone (FLONASE) 50 MCG/ACT nasal spray Place 2 sprays into both nostrils daily. 01/14/16   Virginia Crews, MD  hydrochlorothiazide (HYDRODIURIL) 25 MG tablet Take 1 tablet (25 mg total) by mouth daily. Reported on 03/23/2015 02/02/16   Nicolette Bang, DO  loratadine (CLARITIN) 10 MG tablet Take 1 tablet (10 mg total) by mouth daily. 04/15/15   Devonne Doughty Karamalegos, DO  pantoprazole (PROTONIX) 40 MG tablet TAKE 1 TABLET DAILY (TAKE 30 T0 60 MINUTES BEFORE FIRST MEAL OF THE DAY) 02/11/16   [place] Bing, DO  polyethylene glycol (MIRALAX / GLYCOLAX) packet Take 17 g by mouth daily. 11/27/12   Dorian Heckle, MD  potassium chloride SA (K-DUR,KLOR-CON) 20 MEQ tablet Take 2 tablets (40 mEq total) by mouth daily. 10/13/15   Skeet Latch, MD  senna (SENOKOT) 8.6 MG tablet Take 2 tablets by mouth 2 (two) times daily.    Historical Provider, MD    Family History Family History  Problem Relation Age of Onset  . Cancer Mother   . Heart failure Father   . Kidney failure Brother   . Hypertension Brother   . Heart disease Sister   . Stroke Sister   . Stroke Sister     Social History Social History  Substance Use Topics  . Smoking status: Former Smoker    Packs/day: 1.00    Years: 10.00    Types: Cigarettes    Quit date: 05/16/2001  . Smokeless tobacco: Never Used  . Alcohol use No     Allergies   Ivp dye [iodinated diagnostic agents]; Lisinopril; Lortab [hydrocodone-acetaminophen]; Milk-related compounds; Sodium hypochlorite; Sulfa antibiotics; and Adhesive [tape]   Review of Systems Review of Systems  Constitutional: Positive for malaise/fatigue. Negative for chills, diaphoresis and fever.  HENT: Negative for ear pain and sore throat.   Eyes: Negative for pain and visual disturbance.  Respiratory: Negative for cough, hemoptysis, sputum production and  shortness of breath.   Cardiovascular: Positive for chest pain. Negative for palpitations, orthopnea, claudication, syncope, PND and near-syncope.  Gastrointestinal: Negative for abdominal pain, nausea and vomiting.  Genitourinary: Negative for dysuria and hematuria.  Musculoskeletal: Negative for arthralgias, back pain and gait problem.  Skin: Negative for color change and rash.  Neurological: Positive for light-headedness and numbness (subjective numbness and tingling to left arm and left leg). Negative for dizziness, seizures, syncope, weakness and headaches.  Psychiatric/Behavioral: Negative for confusion.  All other systems reviewed and are negative.    Physical Exam Updated Vital Signs BP 128/90   Pulse 73   Temp 98.9 F (37.2 C)   Resp 12   Ht 5\' 6"  (1.676 m)   Wt 75.3 kg   SpO2 98%   BMI 26.79 kg/m   Physical Exam  Constitutional: She is oriented to person, place, and time. She appears well-developed and well-nourished. No distress.  HENT:  Head: Normocephalic and atraumatic.  Eyes: Conjunctivae and EOM are normal.  Neck: Neck supple.  Cardiovascular: Normal rate and regular rhythm.   No murmur heard. Pulmonary/Chest: Effort normal and breath sounds normal. No respiratory distress.  Abdominal: Soft. There is no tenderness.  Musculoskeletal: She exhibits no edema.  Neurological: She is alert and oriented to person, place, and time. She has normal strength. She is not disoriented. A sensory deficit (Subjective numbness in left arm and left leg) is present. No cranial nerve deficit. Coordination and gait normal. GCS eye subscore is 4. GCS verbal subscore is 5. GCS motor subscore is 6.  Skin: Skin is warm and dry.  Psychiatric: She has a normal mood and affect.  Nursing note and vitals reviewed.    ED Treatments / Results  Labs (all labs ordered are listed, but only abnormal results are displayed) Labs Reviewed  COMPREHENSIVE METABOLIC PANEL - Abnormal; Notable for  the following:       Result Value   Potassium 3.3 (*)    Chloride 100 (*)    ALT 13 (*)    All other components within normal limits  I-STAT CHEM 8, ED - Abnormal; Notable for the following:    Potassium 3.2 (*)    Calcium, Ion 1.10 (*)    All other components within normal limits  PROTIME-INR  APTT  CBC  DIFFERENTIAL  I-STAT TROPOININ, ED  CBG MONITORING, ED  I-STAT TROPOININ, ED    EKG  EKG Interpretation  Date/Time:  Tuesday May 25 2016 11:48:53 EDT Ventricular Rate:  84 PR Interval:  162 QRS Duration: 82 QT Interval:  400 QTC Calculation: 472 R Axis:   6 Text Interpretation:  Normal sinus rhythm Normal ECG Normal ECG Confirmed by Carmin Muskrat  MD [phone]) on 05/25/2016 3:25:41 PM       Radiology Ct Head Wo Contrast  Result Date: 05/25/2016 CLINICAL DATA:  Numbness, stroke symptoms greater than 8 hours EXAM: CT HEAD WITHOUT CONTRAST TECHNIQUE: Contiguous axial images were obtained from the base of the skull through the vertex without intravenous contrast. COMPARISON:  None. FINDINGS: Brain: No intracranial hemorrhage, mass effect or midline shift. Mild cerebral atrophy. No definite acute cortical infarction. No mass lesion is noted on this unenhanced scan. Mild periventricular and patchy subcortical white matter decreased attenuation probable due to chronic small vessel ischemic change no mass lesion is noted on this unenhanced scan. Vascular: No hyperdense vessel or unexpected calcification. Skull: Normal. Negative for fracture or focal lesion. Sinuses/Orbits: No acute finding. Other: None. IMPRESSION: No acute intracranial abnormality. Mild cerebral atrophy. Minimal periventricular and patchy subcortical white matter decreased attenuation probable due to chronic small vessel ischemic changes. No definite acute cortical infarction. Electronically Signed   By: Lahoma Crocker M.D.   On: 05/25/2016 12:36    Procedures Procedures (including critical care time)  Medications  Ordered in ED Medications  potassium chloride SA (K-DUR,KLOR-CON) CR tablet 40 mEq (not administered)  aspirin chewable tablet 162 mg (not administered)     Initial Impression / Assessment and Plan / ED Course  I have reviewed the triage vital signs and the nursing notes.  Pertinent labs & imaging results that were available during my care of the patient were reviewed by me and considered in my medical decision making (see chart for details).     71 year old black female with history of peripheral vascular disease, AAA, hypertension, hyperlipidemia presents to the setting of chest pressure, left arm pain, left arm and left leg numbness. The patient reports when she went to bed last night she had some mild headache and mild  lightheadedness. When she woke today she has felt she has had a pressure on her retrosternal area since awakening. No significant changes since that time. Additionally patient has had the left arm pain and left arm subjective numbness. Patient with additional left lateral lower leg numbness.  On arrival patient hemodynamically stable and afebrile. EKG without signs of acute ischemia. Initial laboratory analysis with mild hypokalemia which was repleted and no other significant abnormality. Initial troponin negative. CT head obtained which revealed no acute intracranial abnormality. On assessment patient with subjective numbness but no other focal neurologic deficit. Patient has bounding pulses in all 4 extremities. Patient able to ambulate without palpitations. Patient given 160 mg of aspirin with concern for possible ACS versus worsening vascular disease. Have low suspicion for CVA however with patient's vascular history will need admission to family medicine for further ACS workup and further management of new numbness with possible vascular accommodation.  Patient stable at time of transfer of care.  Final Clinical Impressions(s) / ED Diagnoses   Final diagnoses:  Chest  pain, unspecified type  Numbness  Lightheadedness  Chest pressure    New Prescriptions New Prescriptions   No medications on file     Esaw Grandchild, MD 05/25/16 1625    Carmin Muskrat, MD 05/25/16 2316

## 2016-05-25 NOTE — H&P (Signed)
[facility] Admission History and Physical Service Pager: [phone]  Patient name: Kathryn [name] Medical record number: [id number] Date of birth: July 21, 1945 Age: 71 y.o. Gender: female  Primary Care Provider: [place] Bing, DO Consultants: None Code Status: DNR  Chief Complaint: Chest tightness  Assessment and Plan: Kathryn [name] is a 71 y.o. female presenting with chest tightness for ACS rule out . PMH is significant for AAA, Breast Cancer s/p right mastectomy in remission, HTN, Chronic constipation, COPD, CHF  Acute episode of Chest tightness - ACS rule out - Given risk factors including age and history of PAD per chart review, concern for ACS and so patient admitted for rule-out.  COPD exacerbation less likely as patient satting well on room air with no wheezes on exam. CXR negative for PNA and patient afebrile, without cough or O2 requirement.  CHF exacerbation also considered given history of PND, however euvolemic on exam and CXR without excess fluid. - Admit to FPTS Dr. McDiarmid  - monitor onTelemetry - trend troponins x3 - AM EKG - risk stratify with lipid panel, HgbA1c, TSH - started a statin, atorvastatin 40 mg qD - GI cocktail PRN  Hypokalemia- mild hypokalemia to 3.3, received kdur 40 mEQ in the ED - AM BMP  Numbness Left Arm - uncertain etiology, acute infarction less likely given chronicity of this symptom and negative CT head - monitor  AAA - 3.9 cm in diameter in 2016 ultrasound - will repeat US of abdomen -  Continue to monitor BP, clinical status, patient overall well-appearing on exam - CXR notes tortuosity of descending thoracic aorta, no dilation  HFpEF - Echo 10/2015 showed EF 60-65%, G1DD, severe focal basal and mild concentric hypertrophy with normal systolic function, normal wall motion, mild mitral regurg - takes HCTZ at home, will continue - euvolemic on admission  HTN  - normotensive in the ED. Took her AM medications.  -  continue home diltiazem 360 mg QD in AM - continue home HCTZ 25 mg QD in AM  Constipation, Chronic - Continue senna, colace  COPD, chronic, stable, no home meds listed. Can add on duonebs if needed.   FEN/GI: Heart healthy diet, no IV fluids Prophylaxis: lovenox  Disposition: Admit to Telemetry FPTS for ACS Rule out  History of Present Illness:  Kathryn [name] is a 71 y.o. female presenting with chest tightness. Patient reports that she was in her usual state of health until this afternoon, when she noted chest tightness and pangs of pain that radiated to her back. She noted shortness of breath, particularly walking from the parking lot into the emergency department. She also notes that with the pain she had some diaphoresis and nausea. Patient reports that she has had episodes similar to this one in the past, however she is unable to remember specifics. She denies recent fevers, no productive cough, no episodes of emesis, no diarrhea, no constipation, no changes in urination.  At baseline, patient endorses sleeping with 2-3 pillows and does endorse PND. She has not noted LE edema. She lives alone and manages all of her own medications.  Of note, patient also endorses Left arm and Left leg numbness that has been chronic but worsened today.  There was initial concern for stroke in the ED due to this new numbness, however CT head was negative. With further questioning from our admitting team the patient endorses that this has been ongoing, but worsened today. She felt it improved by the time the admitting team interviewed her.  No weakness, no other focal neurologic deficits endorsed. Patient does note headache that has been going on since sitting in the ED. No vision changes.  In the ED, there is concern for ACS and patient underwent troponins which were negative and EKG which was concerning for acute coronary syndrome. Chest x-ray was negative for infiltrate or congestion.  Review Of Systems: Per  HPI.  ROS  Patient Active Problem List   Diagnosis Date Noted  . Chest pain 05/25/2016  . Cough 02/02/2016  . Urinary urgency 12/01/2015  . Rash 11/19/2015  . Chronic respiratory failure with hypoxia and hypercapnia ([place]) 11/04/2015  . Dyspnea 11/03/2015  . Atherosclerotic peripheral vascular disease with intermittent claudication ([place]) 08/13/2015  . Palpitations 04/15/2015  . Allergic sinusitis 04/15/2015  . Hypokalemia 04/15/2015  . CAP (community acquired pneumonia) 01/15/2015  . History of recurrent UTI (urinary tract infection) 01/15/2015  . Peripheral arterial disease ([place]) 11/12/2014  . Osteoarthritis, multiple sites 11/12/2014  . History of colonic polyps 11/11/2014  . Protein-calorie malnutrition, severe ([place]) 11/27/2012  . Breast cancer ([place]) 11/24/2012  . AAA (abdominal aortic aneurysm) ([place]) 11/24/2012  . Depression 11/24/2012  . Hypertension   . Asthma     Past Medical History: Past Medical History:  Diagnosis Date  . Asthma 2003  . Cancer ([place]) 2006  . GERD (gastroesophageal reflux disease) 2003  . Hypertension 1978  . Migraine headache 2008    Past Surgical History: Past Surgical History:  Procedure Laterality Date  . ABDOMINAL HYSTERECTOMY  1989  . BREAST SURGERY    . CHOLECYSTECTOMY  1985  . ROTATOR CUFF REPAIR  2007    Social History: Social History  Substance Use Topics  . Smoking status: Former Smoker    Packs/day: 1.00    Years: 10.00    Types: Cigarettes    Quit date: 05/16/2001  . Smokeless tobacco: Never Used  . Alcohol use No    Family History: Family History  Problem Relation Age of Onset  . Cancer Mother   . Heart failure Father   . Kidney failure Brother   . Hypertension Brother   . Heart disease Sister   . Stroke Sister   . Stroke Sister     Allergies and Medications: Allergies  Allergen Reactions  . Ivp Dye [Iodinated Diagnostic Agents] Hives  . Lisinopril Cough  . Lortab [Hydrocodone-Acetaminophen] Itching  .  Milk-Related Compounds Other (See Comments)    Stomach pains, gas, bloating  . Sodium Hypochlorite Other (See Comments)    wheezing  . Sulfa Antibiotics Hives  . Adhesive [Tape] Rash   No current facility-administered medications on file prior to encounter.    Current Outpatient Prescriptions on File Prior to Encounter  Medication Sig Dispense Refill  . aspirin EC 81 MG tablet Take 1 tablet (81 mg total) by mouth daily.    . clotrimazole-betamethasone (LOTRISONE) lotion Apply topically 2 (two) times daily. 30 mL 0  . diltiazem (CARDIZEM CD) 360 MG 24 hr capsule Take 1 capsule (360 mg total) by mouth daily. 90 capsule 0  . docusate sodium (COLACE) 100 MG capsule Take 200 mg by mouth 2 (two) times daily as needed for constipation.    . famotidine (PEPCID) 20 MG tablet One at bedtime    . fluticasone (FLONASE) 50 MCG/ACT nasal spray Place 2 sprays into both nostrils daily. 16 g 2  . hydrochlorothiazide (HYDRODIURIL) 25 MG tablet Take 1 tablet (25 mg total) by mouth daily. Reported on 03/23/2015 90 tablet 1  . loratadine (  CLARITIN) 10 MG tablet Take 1 tablet (10 mg total) by mouth daily. 30 tablet 11  . pantoprazole (PROTONIX) 40 MG tablet TAKE 1 TABLET DAILY (TAKE 30 T0 60 MINUTES BEFORE FIRST MEAL OF THE DAY) 90 tablet 2  . polyethylene glycol (MIRALAX / GLYCOLAX) packet Take 17 g by mouth daily. 14 each 0  . potassium chloride SA (K-DUR,KLOR-CON) 20 MEQ tablet Take 2 tablets (40 mEq total) by mouth daily. 180 tablet 3  . senna (SENOKOT) 8.6 MG tablet Take 2 tablets by mouth 2 (two) times daily.    Marland Kitchen FLUoxetine (PROZAC) 20 MG capsule Take 20 mg by mouth daily. Reported on 03/23/2015      Objective: BP 140/85   Pulse 78   Temp 98.9 F (37.2 C)   Resp 15   Ht 5\' 6"  (1.676 m)   Wt 166 lb (75.3 kg)   SpO2 95%   BMI 26.79 kg/m  Exam: General: NAD, rests comfortably sitting in bed Eyes: PERRL, EOMI, no scleral icterus or conjunctival injection ENTM: MMM, no pharyngeal erythema or  exudate Neck: no LAD, no thyromegaly Cardiovascular: RRR, no m/r/g Respiratory: CTA bilaterally, comfortable work of breathing, speaking in full sentences, no W/R/R Gastrointestinal: soft, nontender, nondistended, normoactive BS MSK: moves all extremities equally  Derm: no rashes or lesions, skin warm and well-perfused Neuro: CN II-XII intact, no dysmetria, strength 5/5 in UE and LE bilaterally, endorses decreased sensation over left arm, no other focal neurologic deficits, gait not assessed Psych: AAOx3, affect appropriate  Labs and Imaging: CBC BMET   Recent Labs Lab 05/25/16 1148 05/25/16 1203  WBC 10.5  --   HGB 12.6 13.6  HCT 38.5 40.0  PLT 218  --     Recent Labs Lab 05/25/16 1148 05/25/16 1203  NA 142 140  K 3.3* 3.2*  CL 100* 101  CO2 29  --   BUN 12 14  CREATININE 0.91 0.90  GLUCOSE 96 94  CALCIUM 9.4  --      Dg Chest 2 View 05/25/2016 FINDINGS: The lungs are well-expanded. The heart and pulmonary vascularity are normal. The mediastinum is normal in width. There is calcification in the wall of the aortic arch. There is tortuosity of the descending thoracic aorta. The bony thorax exhibits no acute abnormality.  IMPRESSION: There is no pneumonia nor CHF. Thoracic aortic atherosclerosis.  Ct Head Wo Contrast 05/25/2016 FINDINGS: Brain: No intracranial hemorrhage, mass effect or midline shift. Mild cerebral atrophy. No definite acute cortical infarction. No mass lesion is noted on this unenhanced scan. Mild periventricular and patchy subcortical white matter decreased attenuation probable due to chronic small vessel ischemic change no mass lesion is noted on this unenhanced scan. Vascular: No hyperdense vessel or unexpected calcification. Skull: Normal. Negative for fracture or focal lesion. Sinuses/Orbits: No acute finding. Other: None. IMPRESSION: No acute intracranial abnormality. Mild cerebral atrophy. Minimal periventricular and patchy subcortical white matter  decreased attenuation probable due to chronic small vessel ischemic changes. No definite acute cortical infarction.     Everrett Coombe, MD 05/25/2016, 7:36 PM PGY-1, [place] Intern pager: [phone], text pages welcome   I have seen and examined the patient. I have read and agree with the above note. My changes are noted in blue.  Sady Monaco A. Lincoln Brigham MD, [facility] Resident PGY-2 Pager [phone]

## 2016-05-26 ENCOUNTER — Observation Stay (HOSPITAL_COMMUNITY): Payer: Medicare HMO

## 2016-05-26 DIAGNOSIS — I110 Hypertensive heart disease with heart failure: Secondary | ICD-10-CM | POA: Diagnosis not present

## 2016-05-26 DIAGNOSIS — I714 Abdominal aortic aneurysm, without rupture: Secondary | ICD-10-CM | POA: Diagnosis not present

## 2016-05-26 DIAGNOSIS — R079 Chest pain, unspecified: Secondary | ICD-10-CM | POA: Diagnosis not present

## 2016-05-26 DIAGNOSIS — E876 Hypokalemia: Secondary | ICD-10-CM | POA: Diagnosis not present

## 2016-05-26 LAB — LIPID PANEL
CHOL/HDL RATIO: 3.6 ratio
Cholesterol: 254 mg/dL — ABNORMAL HIGH (ref 0–200)
HDL: 71 mg/dL (ref >40)
LDL Cholesterol: 158 mg/dL — ABNORMAL HIGH (ref 0–99)
Triglycerides: 123 mg/dL (ref <150)
VLDL: 25 mg/dL (ref 0–40)

## 2016-05-26 LAB — TROPONIN I
Troponin I: <0.03 ng/mL (ref <0.03)
Troponin I: <0.03 ng/mL (ref <0.03)

## 2016-05-26 LAB — BASIC METABOLIC PANEL
Anion gap: 11 (ref 5–15)
BUN: 15 mg/dL (ref 6–20)
CHLORIDE: 103 mmol/L (ref 101–111)
CO2: 27 mmol/L (ref 22–32)
Calcium: 9 mg/dL (ref 8.9–10.3)
Creatinine, Ser: 0.74 mg/dL (ref 0.44–1.00)
GFR calc Af Amer: >60 mL/min (ref >60)
GFR calc non Af Amer: >60 mL/min (ref >60)
GLUCOSE: 92 mg/dL (ref 65–99)
POTASSIUM: 3.2 mmol/L — AB (ref 3.5–5.1)
Sodium: 141 mmol/L (ref 135–145)

## 2016-05-26 LAB — TSH: TSH: 1.287 u[IU]/mL (ref 0.350–4.500)

## 2016-05-26 MED ORDER — DILTIAZEM HCL 60 MG PO TABS
90.0000 mg | ORAL_TABLET | Freq: Four times a day (QID) | ORAL | Status: DC
  Administered 2016-05-26 (×2): 90 mg via ORAL
  Filled 2016-05-26 (×4): qty 1

## 2016-05-26 MED ORDER — POTASSIUM CHLORIDE CRYS ER 20 MEQ PO TBCR
40.0000 meq | EXTENDED_RELEASE_TABLET | Freq: Two times a day (BID) | ORAL | Status: DC
  Administered 2016-05-26: 40 meq via ORAL
  Filled 2016-05-26: qty 2

## 2016-05-26 MED ORDER — IOPAMIDOL (ISOVUE-370) INJECTION 76%
INTRAVENOUS | Status: AC
  Administered 2016-05-26: 100 mL
  Filled 2016-05-26: qty 100

## 2016-05-26 MED ORDER — POLYETHYLENE GLYCOL 3350 17 G PO PACK
17.0000 g | PACK | Freq: Two times a day (BID) | ORAL | Status: DC
  Administered 2016-05-26: 17 g via ORAL
  Filled 2016-05-26: qty 1

## 2016-05-26 MED ORDER — DILTIAZEM HCL 30 MG PO TABS
90.0000 mg | ORAL_TABLET | Freq: Four times a day (QID) | ORAL | Status: DC
  Filled 2016-05-26: qty 1

## 2016-05-26 MED ORDER — ATORVASTATIN CALCIUM 40 MG PO TABS: 40.0000 mg | ORAL_TABLET | Freq: Every day | ORAL | 0 refills | Status: DC

## 2016-05-26 MED ORDER — DIPHENHYDRAMINE HCL 50 MG/ML IJ SOLN
50.0000 mg | Freq: Once | INTRAMUSCULAR | Status: AC
  Administered 2016-05-26: 50 mg via INTRAVENOUS
  Filled 2016-05-26: qty 1

## 2016-05-26 MED ORDER — HYDROCORTISONE NA SUCCINATE PF 100 MG IJ SOLR
200.0000 mg | Freq: Four times a day (QID) | INTRAMUSCULAR | Status: AC
  Administered 2016-05-26: 200 mg via INTRAVENOUS
  Filled 2016-05-26: qty 4

## 2016-05-26 NOTE — Progress Notes (Signed)
FPTS Social Note  Patient is working with physical therapy during interview. Patient endorses fatigue at 200 feet with increased 5/10 chest pain which is worse with palpation. No longer radiating to back and improved with Tylenol. Patient states wrist numbness has been going on for some time since it was fractured. MRI negative during admission for stroke. Of concern, worsening infrarenal abdominal aneurysm. Team plans on consulted during vascular surgery. Discussed this with patient who understands situation. We'll continue to monitor patient during hospitalization. Appreciate the great care and FPTS per finding for Kathryn [name].  -- Harriet [place], [place], PGY-1

## 2016-05-26 NOTE — Progress Notes (Signed)
Order received to discharge patient.  Telemetry monitor removed and CCMD notified.  PIV access removed.  Discharge instructions, follow up, medications and instructions for their use were discussed with patient. 

## 2016-05-26 NOTE — Progress Notes (Signed)
Documentation:   Spoke with Dr. Trula Slade from [facility] who reviewed CTA. Reports AAA is about 5cm. His office will contact patient for follow up within 2 weeks.   Smiley Houseman, MD PGY 2 Family Medicine

## 2016-05-26 NOTE — Care Management Obs Status (Signed)
[facility] NOTIFICATION   Patient Details  Name: Snigdha Howser MRN: [id number] Date of Birth: 1945-03-25   Medicare Observation Status Notification Given:  Yes    Dawayne Patricia, RN 05/26/2016, 5:08 PM

## 2016-05-26 NOTE — Evaluation (Signed)
Physical Therapy Evaluation Patient Details Name: Kathryn [name] MRN: [id number] DOB: 1945-03-10 Today's Date: 05/26/2016   History of Present Illness  Kathryn [name] is a 71 y.o. female presenting with chest tightness for ACS rule out . PMH is significant for AAA, Breast Cancer s/p right mastectomy in remission, HTN, Chronic constipation, COPD, CHF, left forearm fx last year. MRI of head neg for acute findings  Clinical Impression  Pt admitted with above diagnosis. Pt currently with functional limitations due to the deficits listed below (see PT Problem List). Pt ambulated 300' with occasional staggering to right and left, after 200', pt began to feel fatigued with 5/10 chest pressure and required HHA for safety last 100'. Recommend outpt PT for balance and strengthening when cleared by cards.  Pt will benefit from skilled PT to increase their independence and safety with mobility to allow discharge to the venue listed below.       Follow Up Recommendations Outpatient PT    Equipment Recommendations  None recommended by PT    Recommendations for Other Services OT consult     Precautions / Restrictions Precautions Precautions: Fall Precaution Comments: pt has had several falls and near falls in past couple years Restrictions Weight Bearing Restrictions: No      Mobility  Bed Mobility Overal bed mobility: Modified Independent                Transfers Overall transfer level: Needs assistance Equipment used: None Transfers: Sit to/from Stand Sit to Stand: Supervision         General transfer comment: pt able to stand without assist but reports that she often gets dizzy with initial standing. Discussed strategies for fall prevention and supervision given  Ambulation/Gait Ambulation/Gait assistance: Min assist;Min guard Ambulation Distance (Feet): 300 Feet Assistive device: None;1 person hand held assist Gait Pattern/deviations: Step-through pattern;Staggering  right;Staggering left Gait velocity: decreased Gait velocity interpretation: Below normal speed for age/gender General Gait Details: pt with increased R hip external rotation widening BOS. First 200' pt ambulated with occasional staggering and reaching for rail on wall. After 200', she reported feeling very fatigued with chest pressure 5/10, HHA last 100'. Reported to MD and RN  Stairs            Wheelchair Mobility    Modified Rankin (Stroke Patients Only)       Balance Overall balance assessment: Needs assistance;History of Falls Sitting-balance support: No upper extremity supported Sitting balance-Leahy Scale: Good     Standing balance support: No upper extremity supported Standing balance-Leahy Scale: Fair Standing balance comment: decreased dynamic standing balance when base is narrow and with reaching activities                             Pertinent Vitals/Pain Pain Assessment: No/denies pain    Home Living Family/patient expects to be discharged to:: Private residence Living Arrangements: Alone Available Help at Discharge: Neighbor;Available PRN/intermittently;Family Type of Home: Apartment Home Access: Level entry     Home Layout: One level Home Equipment: None Additional Comments: pt signed up to begin going to [facility]    Prior Function Level of Independence: Independent         Comments: drives, cooks, cleans     Hand Dominance   Dominant Hand: Right    Extremity/Trunk Assessment   Upper Extremity Assessment Upper Extremity Assessment: LUE deficits/detail LUE Deficits / Details: left forearm and hand numbness and weakness, request OT for further  eval LUE Sensation: decreased light touch;decreased proprioception LUE Coordination: decreased fine motor    Lower Extremity Assessment Lower Extremity Assessment: Overall WFL for tasks assessed    Cervical / Trunk Assessment Cervical / Trunk Assessment: Normal   Communication   Communication: No difficulties  Cognition Arousal/Alertness: Awake/alert Behavior During Therapy: WFL for tasks assessed/performed Overall Cognitive Status: Within Functional Limits for tasks assessed                                        General Comments      Exercises     Assessment/Plan    PT Assessment Patient needs continued PT services  PT Problem List Decreased strength;Decreased activity tolerance;Decreased balance;Decreased mobility;Decreased knowledge of precautions;Impaired sensation;Pain;Cardiopulmonary status limiting activity       PT Treatment Interventions DME instruction;Gait training;Functional mobility training;Therapeutic activities;Therapeutic exercise;Balance training;Patient/family education    PT Goals (Current goals can be found in the Care Plan section)  Acute Rehab PT Goals Patient Stated Goal: return home, get stronger PT Goal Formulation: With patient Time For Goal Achievement: 06/09/16 Potential to Achieve Goals: Good    Frequency Min 3X/week   Barriers to discharge Decreased caregiver support lives alone    Co-evaluation               End of Session Equipment Utilized During Treatment: Gait belt Activity Tolerance: Patient limited by pain;Patient limited by fatigue Patient left: in bed;with call bell/phone within reach Nurse Communication: Mobility status PT Visit Diagnosis: Unsteadiness on feet (R26.81);History of falling (Z91.81);Dizziness and giddiness (R42)    Time: 2641-5830 PT Time Calculation (min) (ACUTE ONLY): 21 min   Charges:   PT Evaluation $PT Eval Moderate Complexity: 1 Procedure     PT G Codes:   PT G-Codes **NOT FOR INPATIENT CLASS** Functional Assessment Tool Used: AM-PAC 6 Clicks Basic Mobility Functional Limitation: Mobility: Walking and moving around Mobility: Walking and Moving Around Current Status (N4076): At least 40 percent but less than 60 percent impaired,  limited or restricted Mobility: Walking and Moving Around Goal Status [phone]): At least 1 percent but less than 20 percent impaired, limited or restricted    [place], PT  Acute Rehab Services  [place] 05/26/2016, 11:24 AM

## 2016-05-26 NOTE — Progress Notes (Signed)
Family Medicine Teaching Service Daily Progress Note Intern Pager: [phone]  Patient name: Kathryn [name] Medical record number: [id number] Date of birth: 1945-08-27 Age: 71 y.o. Gender: female  Primary Care Provider: [place] Bing, DO Consultants: Vascular Surgery Code Status: DNR/DNI  Pt Overview and Major Events to Date:  4/10 Admitted for chest tightness  Assessment and Plan: Kathryn [name] is a 71 y.o. female presenting with chest tightness for ACS rule out . PMH is significant for AAA, Breast Cancer s/p right mastectomy in remission, HTN, Chronic constipation, COPD, CHF  Chest pain, resolved. Possibly 2/2 AAA - Admitted for ACS rule out. Troponin < 0.03 x3 with NSR on EKG. Found to have enlarging infrarenal abdominal aortic aneurysm 5.2cm, previously 3.9cm on 11/29/2014. This am with some chest tightness that is reproducible on palpation. - monitor BP - Curbsided Vascular surgery consult given rapid expansion. Recommended obtain CT imaging to verify size. Imaging results will determine when to schedule outpatient follow up. - monitor on Telemetry - risk stratification labs: LDL 158, HgbA1c pending, TSH 1.287 - atorvastatin 40 mg qD - GI cocktail PRN - Continue to monitor BP clinical status - CXR notes tortuosity of descending thoracic aorta, no dilation  Chronic L Arm numbness/weakness - Patient has chronic weakness after L forearm fracture last year. Presented with recent worsening yesterday with numbness and tingling. No concern for acute infarction given chronicity of this symptom and negative imaging. Possible radiculopathy given the paresthesias - CT head neg for acute findings - Brain MRI neg for acute findings. - monitor  Hypokalemia- mild hypokalemia to 3.2 - replete - AM BMP  HFpEF - Echo 10/2015 showed EF 60-65%, G1DD, severe focal basal and mild concentric hypertrophy with normal systolic function, normal wall motion, mild mitral regurg - continue home HCTZ -  euvolemic on admission  HTN  - normotensive since admission, one elevated BP this morning to 161/81. - continue home diltiazem 360 mg QD in AM - continue home HCTZ 25 mg QD in AM - monitor BP, if recheck elevated  Constipation, Chronic - Continue senna, colace, miralax  COPD, chronic, stable, no home meds listed. Can add on duonebs if needed.   FEN/GI: Heart healthy diet, no IV fluids Prophylaxis: lovenox  Disposition: pending medical management   Subjective:  States chest pain has resolved but still has some chest tightness. Has continued numbness and tingling in her L hand. Also endorses some hard stools, did have BM this morning with some straining.  Objective: Temp:  [98.2 F (36.8 C)-98.9 F (37.2 C)] 98.3 F (36.8 C) (04/11 8786) Pulse Rate:  [60-83] 79 (04/11 0608) Resp:  [12-18] 14 (04/11 0608) BP: (115-161)/(66-99) 161/81 (04/11 0608) SpO2:  [95 %-100 %] 99 % (04/11 0608) Weight:  [162 lb 14.7 oz (73.9 kg)-166 lb (75.3 kg)] 162 lb 14.7 oz (73.9 kg) (04/11 7672) Physical Exam: General: NAD, rests comfortably sitting in bed  Cardiovascular: RRR, no m/r/g Respiratory: CTA bilaterally, comfortable work of breathing, speaking in full sentences, no W/R/R Abdomen: soft, nontender, nondistended, normoactive BS Extremities: no cyanosis, clubbing or edema Neuro: alert and oriented. L UE strength 4/5, otherwise strength 5/5 throughout. Otherwise no focal deficits  Laboratory:  Recent Labs Lab 05/25/16 1148 05/25/16 1203  WBC 10.5  --   HGB 12.6 13.6  HCT 38.5 40.0  PLT 218  --     Recent Labs Lab 05/25/16 1148 05/25/16 1203 05/26/16 0606  NA 142 140 141  K 3.3* 3.2* 3.2*  CL 100* 101 103  CO2 29  --  27  BUN 12 14 15   CREATININE 0.91 0.90 0.74  CALCIUM 9.4  --  9.0  PROT 7.3  --   --   BILITOT 0.5  --   --   ALKPHOS 69  --   --   ALT 13*  --   --   AST 15  --   --   GLUCOSE 96 94 92   Lipid Panel     Component Value Date/Time   CHOL 254 (H)  05/26/2016 0011   TRIG 123 05/26/2016 0011   HDL 71 05/26/2016 0011   CHOLHDL 3.6 05/26/2016 0011   VLDL 25 05/26/2016 0011   LDLCALC 158 (H) 05/26/2016 0011   TSH 1.287  Troponin <0.03 x3  Imaging/Diagnostic Tests: Dg Chest 2 View  Result Date: 05/25/2016 CLINICAL DATA:  Chest heaviness, numbness in the left arm and leg, headache. Symptoms began upon awakening today. History of migraines, hypertension, asthma, breast malignancy, peripheral vascular disease, former smoker. EXAM: CHEST  2 VIEW COMPARISON:  Chest x-ray of October 03, 2015. FINDINGS: The lungs are well-expanded. The heart and pulmonary vascularity are normal. The mediastinum is normal in width. There is calcification in the wall of the aortic arch. There is tortuosity of the descending thoracic aorta. The bony thorax exhibits no acute abnormality. IMPRESSION: There is no pneumonia nor CHF. Thoracic aortic atherosclerosis. Electronically Signed   By: David  Martinique M.D.   On: 05/25/2016 16:57   Ct Head Wo Contrast  Result Date: 05/25/2016 CLINICAL DATA:  Numbness, stroke symptoms greater than 8 hours EXAM: CT HEAD WITHOUT CONTRAST TECHNIQUE: Contiguous axial images were obtained from the base of the skull through the vertex without intravenous contrast. COMPARISON:  None. FINDINGS: Brain: No intracranial hemorrhage, mass effect or midline shift. Mild cerebral atrophy. No definite acute cortical infarction. No mass lesion is noted on this unenhanced scan. Mild periventricular and patchy subcortical white matter decreased attenuation probable due to chronic small vessel ischemic change no mass lesion is noted on this unenhanced scan. Vascular: No hyperdense vessel or unexpected calcification. Skull: Normal. Negative for fracture or focal lesion. Sinuses/Orbits: No acute finding. Other: None. IMPRESSION: No acute intracranial abnormality. Mild cerebral atrophy. Minimal periventricular and patchy subcortical white matter decreased  attenuation probable due to chronic small vessel ischemic changes. No definite acute cortical infarction. Electronically Signed   By: Lahoma Crocker M.D.   On: 05/25/2016 12:36   US Aorta  Result Date: 05/25/2016 CLINICAL DATA:  History of abdominal aortic aneurysm.  Follow-up. EXAM: ULTRASOUND OF ABDOMINAL AORTA TECHNIQUE: Ultrasound examination of the abdominal aorta was performed to evaluate for abdominal aortic aneurysm. COMPARISON:  11/29/2014. FINDINGS: Abdominal Aorta Maximal diameter of the proximal aorta is 2.7 cm. Maximal diameter of the mid abdominal aorta is 2.4 cm. Maximal diameter of the infrarenal distal abdominal aortic and/or is 5.2 x 4.6 x 4.5 cm. Common iliac arteries are aneurysmal, right measuring 2.1 x 2.0 x 0.9 cm and left measuring 1.9 x 1.4 x 1.5 cm. Maximum Diameter: 5.2 IMPRESSION: Infrarenal abdominal aortic aneurysm with maximal diameter measured at 5.2 cm today. Previously, on 11/29/2014, this was 3.9 cm. This represents considerable enlargement. Vascular surgery consultation recommended due to increased risk of rupture for AAA >5.5 cm. Though maximal diameter is 5.2 cm, there has been fairly rapid growth. This recommendation follows ACR consensus guidelines: White Paper of the ACR Incidental Findings Committee II on Vascular Findings. J Am Coll Radiol 2013; 10:789-794. Electronically Signed   By:  Nelson Chimes M.D.   On: 05/25/2016 20:47    Bufford Lope, DO 05/26/2016, 8:10 AM PGY-1, [place] Intern pager: [phone], text pages welcome

## 2016-05-27 ENCOUNTER — Telehealth: Payer: Self-pay | Admitting: Surgery

## 2016-05-27 LAB — HEMOGLOBIN A1C
Hgb A1c MFr Bld: 6 % — ABNORMAL HIGH (ref 4.8–5.6)
Mean Plasma Glucose: 126 mg/dL

## 2016-05-27 NOTE — Telephone Encounter (Signed)
-----   Message from Mena Goes, RN sent at 05/27/2016 10:11 AM EDT ----- Regarding: 2 weeks NP visit per [facility]   ----- Message ----- From: Serafina Mitchell, MD Sent: 05/26/2016   6:04 PM To: [facility] Charge Pool  Have pt see me as a new patient for AAA in 2 weeks.  thanks

## 2016-05-27 NOTE — Discharge Summary (Signed)
[facility] Discharge Summary  Patient name: Kathryn [name] Medical record number: [id number] Date of birth: [birth year] Age: 71 y.o. Gender: female Date of Admission: 05/25/2016  Date of Discharge: 05/26/16 Admitting Physician: Blane Ohara McDiarmid, MD  Primary Care Provider: [place] Bing, DO Consultants: [facility] (curbside)  Indication for Hospitalization: chest pain  Discharge Diagnoses/Problem List:  Chest pain likely 2/2 expanding AAA Chronic L arm weakness with radiculopathy Hypokalemia HFpEF HTN Chronic constipation COPD  Disposition: Home  Discharge Condition: Stable, improved  Discharge Exam:  General: NAD, rests comfortably sitting in bed  Cardiovascular: RRR, no m/r/g Respiratory: CTA bilaterally, comfortable work of breathing, speaking in full sentences, no W/R/R Abdomen: soft, nontender, nondistended, normoactive BS MSK: Positive Spurling maneuver in L arm Extremities: no cyanosis, clubbing or edema Neuro: alert and oriented. L UE strength 4/5, otherwise strength 5/5 throughout. Otherwise no focal deficits  Brief Hospital Course:  Kathryn McBrideis a 71 y.o.femalewith PMH significant for AAA, Breast Cancer s/p right mastectomy in remission, HTN, Chronic constipation, COPD, CHF who presented with chest tightness.  Chest tightness Patient was admitted for ACS rule out. Her chest pain resolved, her troponins were negative x3 with NSR on EKG. Was found to have enlarging infrarenal abdominal aortic aneurysm 5.2cm, previously 3.9cm on 11/29/2014. Vascular surgery was curbside consulted and recommended CT angio to more accurately evaluate the size of the AAA which was about 5cm. Patient was instructed to follow up with [facility] within 2 weeks.  Chronic L Arm numbness/weakness Patient has chronic weakness after L forearm fracture last year. She presented with recent worsening of L arm weakness with numbness and tingling and had positive Spurling maneuver.  Acute stroke was ruled out with CT head and MRI brain. Cervical spine xray showed discogenic degenerative changes with mild loss of disc space height from the C3 through C6 levels.  Issues for Follow Up:  1. Please ensure patient follows up with Vascular surgery for rapidly expanding AAA, appointment on 06/07/16 at 8:30am 2. Please follow up on cervical radiculopathy of L arm  Significant Procedures: none  Significant Labs and Imaging:   Recent Labs Lab 05/25/16 1148 05/25/16 1203  WBC 10.5  --   HGB 12.6 13.6  HCT 38.5 40.0  PLT 218  --     Recent Labs Lab 05/25/16 1148 05/25/16 1203 05/26/16 0606  NA 142 140 141  K 3.3* 3.2* 3.2*  CL 100* 101 103  CO2 29  --  27  GLUCOSE 96 94 92  BUN 12 14 15   CREATININE 0.91 0.90 0.74  CALCIUM 9.4  --  9.0  ALKPHOS 69  --   --   AST 15  --   --   ALT 13*  --   --   ALBUMIN 3.9  --   --     Dg Cervical Spine 2 Or 3 Views  Result Date: 05/26/2016 CLINICAL DATA:  71 y/o  F; left-sided cervical radiculopathy. EXAM: CERVICAL SPINE - 2-3 VIEW COMPARISON:  06/16/2012 neck radiographs. FINDINGS: No acute fracture or prevertebral soft tissue swelling. Normal cervical lordosis without listhesis. Mild loss of the C3 through C6 intervertebral disc space heights. No loss of vertebral body height. Multilevel anterior marginal osteophytes. IMPRESSION: 1. No acute fracture or prevertebral soft tissue swelling. 2. Discogenic degenerative changes with mild loss of disc space height from the C3 through C6 levels. Electronically Signed   By: Kristine Garbe M.D.   On: 05/26/2016 18:46    Ct Angio Chest/abd/pel For  Dissection W And/or W/wo  Result Date: 05/26/2016 CLINICAL DATA:  AAA, evaluate for dissection EXAM: CT ANGIOGRAPHY CHEST, ABDOMEN AND PELVIS TECHNIQUE: Multidetector CT imaging through the chest, abdomen and pelvis was performed using the standard protocol during bolus administration of intravenous contrast. Multiplanar reconstructed  images and MIPs were obtained and reviewed to evaluate the vascular anatomy. CONTRAST:  100 mL Isovue 370 IV COMPARISON:  None. FINDINGS: CTA CHEST FINDINGS Cardiovascular: On unenhanced CT, there is no evidence of intramural hematoma. No evidence of thoracic aortic aneurysm or dissection. Mild atherosclerotic calcifications of the aortic arch. Although not tailored for evaluation of the pulmonary arteries, there is satisfactory opacification to the segmental level. No evidence of pulmonary embolism. The heart is top-normal in size.  No pericardial effusion. Mediastinum/Nodes: No suspicious mediastinal lymphadenopathy. Status post right axillary lymph node dissection. Visualized thyroid is notable for an 8 mm left thyroid nodule. Lungs/Pleura: Mild compressive atelectasis in the medial left lower lobe adjacent to the descending thoracic aorta (series 6/ image 43). No focal consolidation. No suspicious pulmonary nodules. No pleural effusion or pneumothorax. Musculoskeletal: Status post right mastectomy. Mild degenerative changes of the thoracic spine. Review of the MIP images confirms the above findings. CTA ABDOMEN AND PELVIS FINDINGS VASCULAR Aorta: 4.4 x 4.7 cm infrarenal abdominal aortic aneurysm just above the aortic bifurcation (series 7/ image 200), with eccentric mural thrombus. No superimposed inflammatory changes to suggest impending rupture. No evidence of abdominal aortic dissection. Atherosclerotic calcifications of the abdominal aorta. Celiac: Patent. SMA: Patent. Renals: Patent bilaterally. IMA: Patent. Inflow: Patent. Atherosclerotic calcifications of the bilateral iliac arteries. 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 Veins: Retroaortic left renal vein.  Otherwise grossly unremarkable. Review of the MIP images confirms the above findings. NON-VASCULAR Hepatobiliary: Liver is within normal limits. Status post cholecystectomy. No intrahepatic ductal  dilatation. Common duct is mildly prominent but tapers the ampulla, likely postsurgical. Pancreas: Within normal limits. Spleen: Within normal limits. Adrenals/Urinary Tract: Adrenal glands are within normal limits. Kidneys are within normal limits.  No hydronephrosis. Bladder is within normal limits. Stomach/Bowel: Stomach is within normal limits. No evidence of bowel obstruction. Prior appendectomy (series 7/image 168). Lymphatic: No suspicious abdominopelvic lymphadenopathy. Reproductive: Status post hysterectomy. No adnexal masses. Other: No abdominopelvic ascites. Musculoskeletal: Mild degenerative changes of the upper lumbar spine. Review of the MIP images confirms the above findings. IMPRESSION: No evidence of thoracoabdominal aortic dissection. No evidence of pulmonary embolism. 4.4 x 4.7 cm infrarenal abdominal aortic aneurysm. Recommend followup by abdomen and pelvis CTA in 6 months, and vascular surgery referral/consultation if not already obtained. This recommendation follows ACR consensus guidelines: White Paper of the ACR Incidental Findings Committee II on Vascular Findings. J Am Coll Radiol 2013; 10:789-794. Additional ancillary findings as above. Electronically Signed   By: Julian Hy M.D.   On: 05/26/2016 18:51    Results/Tests Pending at Time of Discharge: none  Discharge Medications:  Allergies as of 05/26/2016      Reactions   Ivp Dye [iodinated Diagnostic Agents] Hives   Lisinopril Cough   Lortab [hydrocodone-acetaminophen] Itching   Milk-related Compounds Other (See Comments)   Stomach pains, gas, bloating   Sodium Hypochlorite Other (See Comments)   wheezing   Sulfa Antibiotics Hives   Adhesive [tape] Rash      Medication List    TAKE these medications   aspirin EC 81 MG tablet Take 1 tablet (81 mg total) by mouth daily.   atorvastatin 40 MG tablet Commonly  known as:  LIPITOR Take 1 tablet (40 mg total) by mouth daily at 6 PM.   clotrimazole-betamethasone  lotion Commonly known as:  LOTRISONE Apply topically 2 (two) times daily.   diltiazem 360 MG 24 hr capsule Commonly known as:  CARDIZEM CD Take 1 capsule (360 mg total) by mouth daily.   docusate sodium 100 MG capsule Commonly known as:  COLACE Take 200 mg by mouth 2 (two) times daily as needed for constipation.   famotidine 20 MG tablet Commonly known as:  PEPCID One at bedtime   FLUoxetine 20 MG capsule Commonly known as:  PROZAC Take 20 mg by mouth daily. Reported on 03/23/2015   fluticasone 50 MCG/ACT nasal spray Commonly known as:  FLONASE Place 2 sprays into both nostrils daily.   hydrochlorothiazide 25 MG tablet Commonly known as:  HYDRODIURIL Take 1 tablet (25 mg total) by mouth daily. Reported on 03/23/2015   loratadine 10 MG tablet Commonly known as:  CLARITIN Take 1 tablet (10 mg total) by mouth daily.   pantoprazole 40 MG tablet Commonly known as:  PROTONIX TAKE 1 TABLET DAILY (TAKE 30 T0 60 MINUTES BEFORE FIRST MEAL OF THE DAY)   polyethylene glycol packet Commonly known as:  MIRALAX / GLYCOLAX Take 17 g by mouth daily.   potassium chloride SA 20 MEQ tablet Commonly known as:  K-DUR,KLOR-CON Take 2 tablets (40 mEq total) by mouth daily.   senna 8.6 MG tablet Commonly known as:  SENOKOT Take 2 tablets by mouth 2 (two) times daily.       Discharge Instructions: Please refer to Patient Instructions section of EMR for full details.  Patient was counseled important signs and symptoms that should prompt return to medical care, changes in medications, dietary instructions, activity restrictions, and follow up appointments.   Follow-Up Appointments: Follow-up Information    [place] Bing, DO. Go on 06/04/2016.   Why:  Please go to your hospital follow up appointment at 2:15pm. Contact information: [place] [address] [phone]        [name], MD Follow up.   Specialties:  Vascular Surgery, Cardiology Why:  Dr. Stephens Shire  office (vascular surgery) will call you to make a follow up appointment at their clinic within the next two weeks. Please call them if you have not heard from them in the next week.  Contact information: [place] [address] [place], DO 05/27/2016, 8:19 AM PGY-1, [place]

## 2016-05-27 NOTE — Telephone Encounter (Signed)
Sched appt 06/07/16 at 8:30. Spoke to pt to inform them of appt.

## 2016-06-03 NOTE — Progress Notes (Signed)
Subjective:   Patient ID: Kathryn [name]    DOB: 02-01-1946, 71 y.o. female   MRN: [id number]  CC: "Abdominal aneurysm"  HPI: Kathryn [name] is a 71 y.o. female who presents to clinic today for hospital follow-up. Problems discussed today are as follows:  Abdominal aneurysm: Says she was not seen for follow-up after initial discovery. Denies chest pain or shortness of breath. Patient says she has appointment on Monday for follow-up with vascular surgeon.  Left arm radiculopathy: Continues to have left arm paresthesia. Patient states is minimal and does not impair her ADLs. Denies weakness in the left arm or paresthesia or weakness in the right arm.  Seasonal allergies: Patient complaining of occasional cough, sore throat, and nasal congestion over past week since increase in pollen. Denies rhinorrhea or itchy/watery eyes. Needs refill on antihistamines.  ROS: complete ROS performed, see HPI for pertinent ROS.  [place]: HTN with AAA, PAD, h/o breast cancer, depression, OA, severe protein calorie malnutrition. Smoking status reviewed. Medications reviewed.  Objective:   BP (!) 142/90 (BP Location: Left Leg, Patient Position: Sitting, Cuff Size: Normal)   Pulse 62   Temp 98.4 F (36.9 C) (Oral)   Ht 5\' 6"  (1.676 m)   Wt 164 lb 12.8 oz (74.8 kg)   SpO2 97%   BMI 26.60 kg/m  Vitals and nursing note reviewed.  General: well nourished, well developed, in no acute distress with non-toxic appearance HEENT: normocephalic, atraumatic, moist mucous membranes Neck: supple, non-tender without lymphadenopathy, positive Spurling test on left CV: regular rate and rhythm without murmurs, rubs, or gallops, no lower extremity edema Lungs: clear to auscultation bilaterally with normal work of breathing Abdomen: soft, non-tender, non-distended, no masses or organomegaly palpable, normoactive bowel sounds Skin: warm, dry, no rashes or lesions, cap refill < 2 seconds Extremities: warm and well perfused,  normal tone, 2+ dorsal pedal pulse bilaterally  Assessment & Plan:   AAA (abdominal aortic aneurysm) ([place]) Recently admitted to hospital for chest pain. CTA found enlarged infrarenal AAA compared to previous. VVS consulted recommending outpatient follow-up given stability. Signs of dissection. Former smoker. --VVS ointment scheduled 4/23 at 0830 with Dr. Trula Slade --Discussed reasons to seek emergent medical care --Continue HCTZ 25 mg QD, Cardizem 360 mg QD, and ASA 81 mg QD --Patient not taking Lipitor as she wants to decrease number of medications, discussed importance of adherence to stand therapy given her AAA, patient understands and will restart meds, has meds at home --RTC 3 months  Cervical disc disorder with radiculopathy Left arm paresthesia w/o pain prompting hospital admission for ACS rule out. Cardiac in etiology ruled out. Cervical spine x-ray consistent with multilevel intervertebral discs loss. Positive Spurling test. --Offered medication for paresthesia, however patient declined as this is not bothering her and she would rather be on less medications --Encouraged maintaining good vitamin D intake  Allergic sinusitis Recent exacerbation due to pollen season. Having congestion and sore throat. Needs refills on medications. --Refilled Flonase and Claritin --Encouraged adequate hydration  No orders of the defined types were placed in this encounter.  Meds ordered this encounter  Medications  . fluticasone (FLONASE) 50 MCG/ACT nasal spray    Sig: Place 2 sprays into both nostrils daily.    Dispense:  16 g    Refill:  2  . loratadine (CLARITIN) 10 MG tablet    Sig: Take 1 tablet (10 mg total) by mouth daily.    Dispense:  30 tablet    Refill:  11    Shanon Brow  Yisroel Ramming, [place], PGY-1 06/04/2016 2:49 PM

## 2016-06-04 ENCOUNTER — Ambulatory Visit (INDEPENDENT_AMBULATORY_CARE_PROVIDER_SITE_OTHER): Payer: Medicare HMO | Admitting: Family Medicine

## 2016-06-04 ENCOUNTER — Encounter: Payer: Self-pay | Admitting: Family Medicine

## 2016-06-04 DIAGNOSIS — M5010 Cervical disc disorder with radiculopathy, unspecified cervical region: Secondary | ICD-10-CM | POA: Diagnosis not present

## 2016-06-04 DIAGNOSIS — I714 Abdominal aortic aneurysm, without rupture, unspecified: Secondary | ICD-10-CM

## 2016-06-04 DIAGNOSIS — J309 Allergic rhinitis, unspecified: Secondary | ICD-10-CM | POA: Diagnosis not present

## 2016-06-04 MED ORDER — LORATADINE 10 MG PO TABS: 10.0000 mg | ORAL_TABLET | Freq: Every day | ORAL | 11 refills | Status: DC

## 2016-06-04 MED ORDER — FLUTICASONE PROPIONATE 50 MCG/ACT NA SUSP: 2.0000 | Freq: Every day | NASAL | 2 refills | Status: DC

## 2016-06-04 NOTE — Assessment & Plan Note (Addendum)
Recently admitted to hospital for chest pain. CTA found enlarged infrarenal AAA compared to previous. VVS consulted recommending outpatient follow-up given stability. Signs of dissection. Former smoker. --VVS ointment scheduled 4/23 at 0830 with Dr. Trula Slade --Discussed reasons to seek emergent medical care --Continue HCTZ 25 mg QD, Cardizem 360 mg QD, and ASA 81 mg QD --Patient not taking Lipitor as she wants to decrease number of medications, discussed importance of adherence to stand therapy given her AAA, patient understands and will restart meds, has meds at home --RTC 3 months

## 2016-06-04 NOTE — Assessment & Plan Note (Signed)
Recent exacerbation due to pollen season. Having congestion and sore throat. Needs refills on medications. --Refilled Flonase and Claritin --Encouraged adequate hydration

## 2016-06-04 NOTE — Patient Instructions (Signed)
Thank you for coming in to see [place] today. Please see below to review our plan for today's visit.  1. It is very important that you take your Lipitor as prescribed. This will help prevent your aneurysm from expanding and other complications. Please take this and your prescribed blood pressure medications along with your daily aspirin. 2. You have a follow-up with the vascular surgeon on Monday 4/23 at 8:30 AM. Please be prompt to your appointment. 3. I have refilled your prescription for Flonase in Claritin for seasonal allergies.  Please call the clinic at [phone] if your symptoms worsen or you have any concerns. It was my pleasure to see you. -- Harriet [place], [place], PGY-1

## 2016-06-04 NOTE — Assessment & Plan Note (Addendum)
Left arm paresthesia w/o pain prompting hospital admission for ACS rule out. Cardiac in etiology ruled out. Cervical spine x-ray consistent with multilevel intervertebral discs loss. Positive Spurling test. --Offered medication for paresthesia, however patient declined as this is not bothering her and she would rather be on less medications --Encouraged maintaining good vitamin D intake

## 2016-06-07 ENCOUNTER — Ambulatory Visit (INDEPENDENT_AMBULATORY_CARE_PROVIDER_SITE_OTHER): Payer: Medicare HMO | Admitting: Surgery

## 2016-06-07 ENCOUNTER — Encounter: Payer: Self-pay | Admitting: Surgery

## 2016-06-07 VITALS — BP 148/90 | HR 66 | Temp 98.2°F | Resp 18 | Ht 66.0 in | Wt 166.3 lb

## 2016-06-07 DIAGNOSIS — I714 Abdominal aortic aneurysm, without rupture, unspecified: Secondary | ICD-10-CM

## 2016-06-07 NOTE — Progress Notes (Signed)
[facility] Specialist of [facility]  Patient name: Kathryn [name] MRN: [id number] DOB: 05/15/1945 Sex: female   REFERRING PROVIDER:    Dr. Yisroel Ramming    REASON FOR CONSULT:    AAA  HISTORY OF PRESENT ILLNESS:   Kathryn [name] is a 71 y.o. female, who is is referred for evaluation of an abdominal aortic aneurysm.  She recently presented to the hospital with numbness in her left arm.  She underwent a CT angiogram to rule out dissection.  This was negative for dissection but did show a 4.4 x 4.7 infrarenal abdominal aortic aneurysm.  During her visit she also had an MRI of the brain which was negative.  She was ultimately diagnosed with cervical spondylolisthesis.  She continues to have numbness in her left hand but this is improving.  The patient has been evaluated by cardiology in the past and has a negative stress test.  She does have some shortness of breath.  She takes a statin for hypercholesterolemia.  Her blood pressure is well controlled on medication.  She is a former smoker, but not currently.  PAST MEDICAL HISTORY    Past Medical History:  Diagnosis Date  . AAA (abdominal aortic aneurysm) ([place])   . Asthma 2003  . Cancer ([place]) 2006  . GERD (gastroesophageal reflux disease) 2003  . Hypertension 1978  . Migraine headache 2008     FAMILY HISTORY   Family History  Problem Relation Age of Onset  . Cancer Mother   . Heart failure Father   . Kidney failure Brother   . Hypertension Brother   . Heart disease Sister   . Stroke Sister   . Stroke Sister     SOCIAL HISTORY:   Social History   Social History  . Marital status: Divorced    Spouse name: N/A  . Number of children: N/A  . Years of education: N/A   Occupational History  . Not on file.   Social History Main Topics  . Smoking status: Former Smoker    Packs/day: 1.00    Years: 10.00    Types: Cigarettes    Quit date: 05/16/2001  . Smokeless tobacco: Never Used  .  Alcohol use No  . Drug use: No  . Sexual activity: Not Currently    Partners: Male   Other Topics Concern  . Not on file   Social History Narrative  . No narrative on file    ALLERGIES:    Allergies  Allergen Reactions  . Ivp Dye [Iodinated Diagnostic Agents] Hives  . Lisinopril Cough  . Lortab [Hydrocodone-Acetaminophen] Itching  . Milk-Related Compounds Other (See Comments)    Stomach pains, gas, bloating  . Sodium Hypochlorite Other (See Comments)    wheezing  . Sulfa Antibiotics Hives  . Adhesive [Tape] Rash    CURRENT MEDICATIONS:    Current Outpatient Prescriptions  Medication Sig Dispense Refill  . Acetaminophen (TYLENOL EXTRA STRENGTH PO) Take by mouth.    Marland Kitchen aspirin EC 81 MG tablet Take 1 tablet (81 mg total) by mouth daily.    Marland Kitchen atorvastatin (LIPITOR) 40 MG tablet Take 1 tablet (40 mg total) by mouth daily at 6 PM. 30 tablet 0  . clotrimazole-betamethasone (LOTRISONE) lotion Apply topically 2 (two) times daily. 30 mL 0  . diltiazem (CARDIZEM CD) 360 MG 24 hr capsule Take 1 capsule (360 mg total) by mouth daily. 90 capsule 0  . famotidine (PEPCID) 20 MG tablet One at bedtime    . fluticasone (FLONASE) 50  MCG/ACT nasal spray Place 2 sprays into both nostrils daily. 16 g 2  . hydrochlorothiazide (HYDRODIURIL) 25 MG tablet Take 1 tablet (25 mg total) by mouth daily. Reported on 03/23/2015 90 tablet 1  . loratadine (CLARITIN) 10 MG tablet Take 1 tablet (10 mg total) by mouth daily. 30 tablet 11  . potassium chloride SA (K-DUR,KLOR-CON) 20 MEQ tablet Take 2 tablets (40 mEq total) by mouth daily. 180 tablet 3  . senna (SENOKOT) 8.6 MG tablet Take 2 tablets by mouth 2 (two) times daily.     No current facility-administered medications for this visit.     REVIEW OF SYSTEMS:   [X]  denotes positive finding, [ ]  denotes negative finding Cardiac  Comments:  Chest pain or chest pressure:    Shortness of breath upon exertion: x   Short of breath when lying flat:      Irregular heart rhythm:        Vascular    Pain in calf, thigh, or hip brought on by ambulation: x   Pain in feet at night that wakes you up from your sleep:     Blood clot in your veins:    Leg swelling:         Pulmonary    Oxygen at home:    Productive cough:     Wheezing:         Neurologic    Sudden weakness in arms or legs:  x   Sudden numbness in arms or legs:  x   Sudden onset of difficulty speaking or slurred speech:    Temporary loss of vision in one eye:     Problems with dizziness:  x       Gastrointestinal    Blood in stool:      Vomited blood:         Genitourinary    Burning when urinating:     Blood in urine:        Psychiatric    Major depression:         Hematologic    Bleeding problems:    Problems with blood clotting too easily:        Skin    Rashes or ulcers:        Constitutional    Fever or chills:     PHYSICAL EXAM:   Vitals:   06/07/16 0833 06/07/16 0835  BP: (!) 144/92 (!) 148/90  Pulse: 66   Resp: 18   Temp: 98.2 F (36.8 C)   TempSrc: Oral   Weight: 166 lb 4.8 oz (75.4 kg)   Height: 5\' 6"  (1.676 m)     GENERAL: The patient is a well-nourished female, in no acute distress. The vital signs are documented above. CARDIAC: There is a regular rate and rhythm.  VASCULAR: palpable radial pulses bilaterally, non palpable pedal pulses PULMONARY: Nonlabored respirations ABDOMEN: Soft and non-tender.  Easily palpable AAA which is non-tender  MUSCULOSKELETAL: There are no major deformities or cyanosis. NEUROLOGIC: No focal weakness or paresthesias are detected. SKIN: There are no ulcers or rashes noted. PSYCHIATRIC: The patient has a normal affect.  STUDIES:   I have reviewed her CTA which shows a 4.4 x 4.7 AAA  ASSESSMENT and PLAN   AAA:  The patient has an asymptomatic 4.7 cm infrarenal abdominal aortic aneurysm.  I discussed with the patient that she will need continued monitoring.  I would consider elective repair once her  aneurysm reaches 5 cm.  I am repeating her CT  scan in 6 months.  I told her that if she develops any abdominal pain to go to the emergency department.  All questions were answered today.   [name], MD [facility] [phone] Pager [phone]

## 2016-06-08 NOTE — Addendum Note (Signed)
Addended by: Lianne Cure A on: 06/08/2016 11:20 AM   Modules accepted: Orders

## 2016-07-13 ENCOUNTER — Other Ambulatory Visit: Payer: Self-pay | Admitting: *Deleted

## 2016-07-13 MED ORDER — HYDROCHLOROTHIAZIDE 25 MG PO TABS: 25.0000 mg | ORAL_TABLET | Freq: Every day | ORAL | 1 refills | Status: DC

## 2016-07-13 NOTE — Telephone Encounter (Signed)
Pt is completely out of medication.  Did not realize that she did not have any more refills on it. Clinton Sawyer, Salome Spotted, [place]

## 2016-10-15 ENCOUNTER — Encounter: Payer: Self-pay | Admitting: Gastroenterology

## 2016-10-21 ENCOUNTER — Ambulatory Visit (AMBULATORY_SURGERY_CENTER): Payer: Self-pay | Admitting: *Deleted

## 2016-10-21 VITALS — Ht 66.0 in | Wt 172.8 lb

## 2016-10-21 DIAGNOSIS — Z86010 Personal history of colonic polyps: Secondary | ICD-10-CM

## 2016-10-21 MED ORDER — NA SULFATE-K SULFATE-MG SULF 17.5-3.13-1.6 GM/177ML PO SOLN: 1.0000 [IU] | Freq: Once | ORAL | 0 refills | Status: AC

## 2016-10-21 NOTE — Progress Notes (Signed)
No egg or soy allergy known to patient  No issues with past sedation with any surgeries  or procedures, no intubation problems  No diet pills per patient No home 02 use per patient  No blood thinners per patient  Pt has issues with constipation on Senokot No A fib or A flutter  EMMI video sent to pt's e mail pt. Declined  Pt. States daughter will have to take her son to school the morning of procedure.  She states that she will be back before procedure.

## 2016-10-25 ENCOUNTER — Encounter: Payer: Self-pay | Admitting: Gastroenterology

## 2016-10-29 ENCOUNTER — Telehealth: Payer: Self-pay | Admitting: Radiology

## 2016-10-29 NOTE — Telephone Encounter (Signed)
Called 13 hour prep to [facility]. Called pt and explained prep, not sure she understood. Asked pharmacy to write instructions in detail for the pt.

## 2016-10-29 NOTE — Telephone Encounter (Signed)
Spoke with pt's daughter about 13 hour prep and told her it would be ready today. Was not sure pt understood.

## 2016-11-05 ENCOUNTER — Encounter: Payer: Non-veteran care | Admitting: Gastroenterology

## 2016-11-13 ENCOUNTER — Other Ambulatory Visit: Payer: Self-pay | Admitting: Family Medicine

## 2016-11-17 ENCOUNTER — Telehealth: Payer: Self-pay | Admitting: *Deleted

## 2016-11-17 NOTE — Telephone Encounter (Signed)
Patient has a contrast dye allergy and  Rx was called to [facility] as per daughters instructions  [phone]  Prednisone 50 mg  po take 13 hrs, 7 hrs ,and 1 hr prior to procedure.  Benadryl 50 mg po take 1 hr prior to procedure

## 2016-11-22 ENCOUNTER — Telehealth: Payer: Self-pay | Admitting: Gastroenterology

## 2016-11-22 NOTE — Telephone Encounter (Signed)
She will have colon as scheduled and will discuss EGD with Dr Ardis Hughs the day of the procedure.

## 2016-12-10 ENCOUNTER — Ambulatory Visit (AMBULATORY_SURGERY_CENTER): Payer: Medicare HMO | Admitting: Gastroenterology

## 2016-12-10 ENCOUNTER — Encounter: Payer: Self-pay | Admitting: Gastroenterology

## 2016-12-10 VITALS — BP 171/79 | HR 74 | Temp 98.7°F | Resp 13 | Ht 66.0 in | Wt 172.0 lb

## 2016-12-10 DIAGNOSIS — D120 Benign neoplasm of cecum: Secondary | ICD-10-CM | POA: Diagnosis not present

## 2016-12-10 DIAGNOSIS — Q2733 Arteriovenous malformation of digestive system vessel: Secondary | ICD-10-CM | POA: Diagnosis not present

## 2016-12-10 DIAGNOSIS — D124 Benign neoplasm of descending colon: Secondary | ICD-10-CM

## 2016-12-10 DIAGNOSIS — K5520 Angiodysplasia of colon without hemorrhage: Secondary | ICD-10-CM

## 2016-12-10 DIAGNOSIS — Z86010 Personal history of colonic polyps: Secondary | ICD-10-CM | POA: Diagnosis present

## 2016-12-10 MED ORDER — SODIUM CHLORIDE 0.9 % IV SOLN: 500.0000 mL | INTRAVENOUS | Status: DC

## 2016-12-10 NOTE — Progress Notes (Signed)
Called to room to assist during endoscopic procedure.  Patient ID and intended procedure confirmed with present staff. Received instructions for my participation in the procedure from the performing physician.  

## 2016-12-10 NOTE — Progress Notes (Signed)
Report to PACU, RN, vss, BBS= Clear.  

## 2016-12-10 NOTE — Op Note (Signed)
[facility] Patient Name: Kathryn [name] Procedure Date: 12/10/2016 2:10 PM MRN: [id number] Endoscopist: Milus Banister , MD Age: 71 Referring MD:  Date of Birth: 01-26-1946 Gender: Female Account #: [id number] Procedure:                Colonoscopy Indications:              High risk colon cancer surveillance: Personal                            history of colonic polyps (unknown pathology, >10                            years ago, [place]) Medicines:                Monitored Anesthesia Care Procedure:                Pre-Anesthesia Assessment:                           - Prior to the procedure, a History and Physical                            was performed, and patient medications and                            allergies were reviewed. The patient's tolerance of                            previous anesthesia was also reviewed. The risks                            and benefits of the procedure and the sedation                            options and risks were discussed with the patient.                            All questions were answered, and informed consent                            was obtained. Prior Anticoagulants: The patient has                            taken no previous anticoagulant or antiplatelet                            agents. ASA Grade Assessment: II - A patient with                            mild systemic disease. After reviewing the risks                            and benefits, the patient was deemed in  satisfactory condition to undergo the procedure.                           After obtaining informed consent, the colonoscope                            was passed under direct vision. Throughout the                            procedure, the patient's blood pressure, pulse, and                            oxygen saturations were monitored continuously. The                            Colonoscope was introduced through the  anus and                            advanced to the the cecum, identified by                            appendiceal orifice and ileocecal valve. The                            colonoscopy was performed without difficulty. The                            patient tolerated the procedure well. The quality                            of the bowel preparation was good. The ileocecal                            valve, appendiceal orifice, and rectum were                            photographed. Scope In: 2:14:05 PM Scope Out: 2:32:28 PM Scope Withdrawal Time: 0 hours 13 minutes 41 seconds  Total Procedure Duration: 0 hours 18 minutes 23 seconds  Findings:                 Two sessile polyps were found in the transverse                            colon and cecum. The polyps were 2 to 4 mm in size.                            These polyps were removed with a cold snare.                            Resection and retrieval were complete.                           Medium sized cecal AVM, not bleeding.  The exam was otherwise without abnormality on                            direct and retroflexion views. Complications:            No immediate complications. Estimated blood loss:                            None. Estimated Blood Loss:     Estimated blood loss: none. Impression:               - Two 2 to 4 mm polyps in the transverse colon and                            in the cecum, removed with a cold snare. Resected                            and retrieved.                           - Medium sized cecal AVM.                           - The examination was otherwise normal on direct                            and retroflexion views. Recommendation:           - Patient has a contact number available for                            emergencies. The signs and symptoms of potential                            delayed complications were discussed with the                             patient. Return to normal activities tomorrow.                            Written discharge instructions were provided to the                            patient.                           - Resume previous diet.                           - Continue present medications.                           You will receive a letter within 2-3 weeks with the                            pathology results and my final recommendations.  If the polyp(s) is proven to be 'pre-cancerous' on                            pathology, you will need repeat colonoscopy in 5                            years. If the polyp(s) is NOT 'precancerous' on                            pathology then you should repeat colon cancer                            screening in 10 years with colonoscopy without need                            for colon cancer screening by any method prior to                            then (including stool testing). Milus Banister, MD 12/10/2016 2:36:11 PM This report has been signed electronically.

## 2016-12-10 NOTE — Patient Instructions (Signed)
Discharge instructions given. Handout on polyps. Resume previous medications. YOU HAD AN ENDOSCOPIC PROCEDURE TODAY AT THE [facility]:   Refer to the procedure report that was given to you for any specific questions about what was found during the examination.  If the procedure report does not answer your questions, please call your gastroenterologist to clarify.  If you requested that your care partner not be given the details of your procedure findings, then the procedure report has been included in a sealed envelope for you to review at your convenience later.  YOU SHOULD EXPECT: Some feelings of bloating in the abdomen. Passage of more gas than usual.  Walking can help get rid of the air that was put into your GI tract during the procedure and reduce the bloating. If you had a lower endoscopy (such as a colonoscopy or flexible sigmoidoscopy) you may notice spotting of blood in your stool or on the toilet paper. If you underwent a bowel prep for your procedure, you may not have a normal bowel movement for a few days.  Please Note:  You might notice some irritation and congestion in your nose or some drainage.  This is from the oxygen used during your procedure.  There is no need for concern and it should clear up in a day or so.  SYMPTOMS TO REPORT IMMEDIATELY:   Following lower endoscopy (colonoscopy or flexible sigmoidoscopy):  Excessive amounts of blood in the stool  Significant tenderness or worsening of abdominal pains  Swelling of the abdomen that is new, acute  Fever of 100F or higher   For urgent or emergent issues, a gastroenterologist can be reached at any hour by calling [phone].   DIET:  We do recommend a small meal at first, but then you may proceed to your regular diet.  Drink plenty of fluids but you should avoid alcoholic beverages for 24 hours.  ACTIVITY:  You should plan to take it easy for the rest of today and you should NOT DRIVE or use heavy  machinery until tomorrow (because of the sedation medicines used during the test).    FOLLOW UP: Our staff will call the number listed on your records the next business day following your procedure to check on you and address any questions or concerns that you may have regarding the information given to you following your procedure. If we do not reach you, we will leave a message.  However, if you are feeling well and you are not experiencing any problems, there is no need to return our call.  We will assume that you have returned to your regular daily activities without incident.  If any biopsies were taken you will be contacted by phone or by letter within the next 1-3 weeks.  Please call us at [phone] if you have not heard about the biopsies in 3 weeks.    SIGNATURES/CONFIDENTIALITY: You and/or your care partner have signed paperwork which will be entered into your electronic medical record.  These signatures attest to the fact that that the information above on your After Visit Summary has been reviewed and is understood.  Full responsibility of the confidentiality of this discharge information lies with you and/or your care-partner. 

## 2016-12-13 ENCOUNTER — Telehealth: Payer: Self-pay | Admitting: *Deleted

## 2016-12-13 ENCOUNTER — Encounter: Payer: Self-pay | Admitting: Surgery

## 2016-12-13 ENCOUNTER — Ambulatory Visit
Admission: RE | Admit: 2016-12-13 | Discharge: 2016-12-13 | Disposition: A | Discharge status: Home / Self Care | Payer: Medicare HMO | Source: Ambulatory Visit | Attending: Surgery | Admitting: Surgery

## 2016-12-13 ENCOUNTER — Ambulatory Visit (INDEPENDENT_AMBULATORY_CARE_PROVIDER_SITE_OTHER): Payer: Medicare HMO | Admitting: Surgery

## 2016-12-13 VITALS — BP 141/87 | HR 68 | Temp 98.3°F | Resp 16 | Ht 65.5 in | Wt 170.0 lb

## 2016-12-13 DIAGNOSIS — I714 Abdominal aortic aneurysm, without rupture, unspecified: Secondary | ICD-10-CM

## 2016-12-13 MED ORDER — PREDNISONE 50 MG PO TABS: 50.0000 mg | ORAL_TABLET | Freq: Four times a day (QID) | ORAL | Status: DC

## 2016-12-13 MED ORDER — DIPHENHYDRAMINE HCL 50 MG/ML IJ SOLN: 50.0000 mg | Freq: Once | INTRAMUSCULAR | Status: DC

## 2016-12-13 MED ORDER — DIPHENHYDRAMINE HCL 50 MG PO CAPS: 50.0000 mg | ORAL_CAPSULE | Freq: Once | ORAL | Status: DC

## 2016-12-13 MED ORDER — IOPAMIDOL (ISOVUE-370) INJECTION 76%
75.0000 mL | Freq: Once | INTRAVENOUS | Status: AC | PRN
  Administered 2016-12-13: 75 mL via INTRAVENOUS

## 2016-12-13 NOTE — Telephone Encounter (Signed)
No answer and no voicemail set up to leave message for post procedure call back. Will attempt to call back later this afternoon. SM

## 2016-12-13 NOTE — Progress Notes (Signed)
Vascular and Vein Specialist of [facility]  Patient name: Kathryn [name] MRN: [id number] DOB: [birth year] Sex: female   REASON FOR VISIT:    Follow up AAA  HISOTRY OF PRESENT ILLNESS:   Kathryn [name] is a 71 y.o. female, who is is referred for evaluation of an abdominal aortic aneurysm.  She initially presented to the hospital in 05/2016 with numbness in her left arm.  She underwent a CT angiogram to rule out dissection.  This was negative for dissection but did show a 4.4 x 4.7 infrarenal abdominal aortic aneurysm.  During her visit she also had an MRI of the brain which was negative.  She was ultimately diagnosed with cervical spondylolisthesis.  She continues to have numbness in her left hand but this is improving.  The patient has been evaluated by cardiology in the past and has a negative stress test.  She does have some shortness of breath.  She takes a statin for hypercholesterolemia.  Her blood pressure is well controlled on medication.  She is a former smoker, but not currently.  She does not report any abdominal pain.  She does complain of back pain which radiates down her left leg.   PAST MEDICAL HISTORY:   Past Medical History:  Diagnosis Date  . AAA (abdominal aortic aneurysm) ([place])   . Allergy   . Arthritis   . Asthma 2003  . Breast cancer ([place])    both breasts/lt. mastectomy  . Cancer ([place]) 2006   both breasts/cannot use rt. side  . Cataract    both eyes  . GERD (gastroesophageal reflux disease) 2003  . Heart murmur   . Hyperlipidemia    pt. stopped taking her medication for this /will attempt change in diet  . Hypertension 1978  . Migraine headache 2008     FAMILY HISTORY:   Family History  Problem Relation Age of Onset  . Cancer Mother   . Heart failure Father   . Kidney failure Brother   . Hypertension Brother   . Heart disease Sister   . Stroke Sister   . Stroke Sister   . Colon cancer Neg Hx   . Colon polyps Neg  Hx   . Esophageal cancer Neg Hx   . Stomach cancer Neg Hx   . Rectal cancer Neg Hx     SOCIAL HISTORY:   Social History  Substance Use Topics  . Smoking status: Former Smoker    Packs/day: 1.00    Years: 10.00    Types: Cigarettes    Quit date: 05/16/2001  . Smokeless tobacco: Former Systems developer    Types: Snuff    Quit date: 10/21/2001  . Alcohol use No     ALLERGIES:   Allergies  Allergen Reactions  . Hydrocodone-Acetaminophen Itching  . Ivp Dye [Iodinated Diagnostic Agents] Hives  . Adhesive [Tape] Rash  . Lisinopril Cough  . Lortab [Hydrocodone-Acetaminophen] Itching  . Milk-Related Compounds Other (See Comments)    Stomach pains, gas, bloating  . Sodium Hypochlorite Other (See Comments)    wheezing  . Sulfa Antibiotics Hives     CURRENT MEDICATIONS:   Current Outpatient Prescriptions  Medication Sig Dispense Refill  . Acetaminophen (TYLENOL EXTRA STRENGTH PO) Take by mouth.    Marland Kitchen aspirin EC 81 MG tablet Take 1 tablet (81 mg total) by mouth daily.    Marland Kitchen BIOTIN PO Take 1 capsule by mouth daily.    Marland Kitchen diltiazem (CARDIZEM CD) 360 MG 24 hr capsule Take 1 capsule (360 mg total)  by mouth daily. 90 capsule 0  . famotidine (PEPCID) 20 MG tablet One at bedtime    . fluticasone (FLONASE) 50 MCG/ACT nasal spray Place 2 sprays into both nostrils daily. 16 g 2  . gabapentin (NEURONTIN) 100 MG capsule Take by mouth.    . loratadine (CLARITIN) 10 MG tablet Take 1 tablet (10 mg total) by mouth daily. 30 tablet 11  . pantoprazole (PROTONIX) 40 MG tablet Take by mouth.    . potassium chloride SA (K-DUR,KLOR-CON) 20 MEQ tablet Take 2 tablets (40 mEq total) by mouth daily. 180 tablet 3  . senna (SENOKOT) 8.6 MG tablet Take 2 tablets by mouth 2 (two) times daily.    . hydrochlorothiazide (HYDRODIURIL) 25 MG tablet Take 1 tablet (25 mg total) by mouth daily. Reported on 03/23/2015 (Patient not taking: Reported on 12/13/2016) 90 tablet 1   No current facility-administered medications for this  visit.    Facility-Administered Medications Ordered in Other Visits  Medication Dose Route Frequency Provider Last Rate Last Dose  . diphenhydrAMINE (BENADRYL) capsule 50 mg  50 mg Oral Once Serafina Mitchell, MD       Or  . diphenhydrAMINE (BENADRYL) injection 50 mg  50 mg Intravenous Once Serafina Mitchell, MD      . predniSONE (DELTASONE) tablet 50 mg  50 mg Oral Q6H Serafina Mitchell, MD        REVIEW OF SYSTEMS:   [X]  denotes positive finding, [ ]  denotes negative finding Cardiac  Comments:  Chest pain or chest pressure:    Shortness of breath upon exertion:    Short of breath when lying flat:    Irregular heart rhythm:        Vascular    Pain in calf, thigh, or hip brought on by ambulation:    Pain in feet at night that wakes you up from your sleep:     Blood clot in your veins:    Leg swelling:         Pulmonary    Oxygen at home:    Productive cough:     Wheezing:         Neurologic    Sudden weakness in arms or legs:     Sudden numbness in arms or legs:     Sudden onset of difficulty speaking or slurred speech:    Temporary loss of vision in one eye:     Problems with dizziness:         Gastrointestinal    Blood in stool:     Vomited blood:         Genitourinary    Burning when urinating:     Blood in urine:        Psychiatric    Major depression:         Hematologic    Bleeding problems:    Problems with blood clotting too easily:        Skin    Rashes or ulcers:        Constitutional    Fever or chills:      PHYSICAL EXAM:   Vitals:   12/13/16 0946 12/13/16 0950  BP: (!) 147/90 (!) 141/87  Pulse: 68 68  Resp: 16   Temp: 98.3 F (36.8 C)   SpO2: 96%   Weight: 170 lb (77.1 kg)   Height: 5' 5.5" (1.664 m)     GENERAL: The patient is a well-nourished female, in no acute distress. The vital signs are documented  above. CARDIAC: There is a regular rate and rhythm.  VASCULAR: No carotid bruits PULMONARY: Non-labored respirations ABDOMEN:  Soft and non-tender   MUSCULOSKELETAL: There are no major deformities or cyanosis. NEUROLOGIC: No focal weakness or paresthesias are detected. SKIN: There are no ulcers or rashes noted. PSYCHIATRIC: The patient has a normal affect.  STUDIES:   I have reviewed her CT scan with the following findings: VASCULAR  Abdominal aortic aneurysm. 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  NON-VASCULAR  No acute or pathology.   MEDICAL ISSUES:   AAA:  I have discussed with the patient that I would consider elective repair once her aneurysm reaches 5 cm.  She was told to go to the emergency department should she develop severe abdominal pain.  I have her scheduled for follow-up with an ultrasound in 6 months.  If the aneurysm remains less than 5 cm by ultrasound, she will follow-up to see me in one year with a CT angiogram.    [name], MD Vascular and Vein Specialists of [facility] [phone] Pager [phone]

## 2016-12-13 NOTE — Telephone Encounter (Signed)
Pt returned your call and said she is doing good since the procedure

## 2016-12-13 NOTE — Telephone Encounter (Signed)
Attempted to call for post procedure follow up call and no answer and no voicemail set up to leave a message. Sm

## 2016-12-20 ENCOUNTER — Encounter: Payer: Self-pay | Admitting: Gastroenterology

## 2016-12-23 NOTE — Addendum Note (Signed)
Addended by: Lianne Cure A on: 12/23/2016 12:27 PM   Modules accepted: Orders

## 2017-04-15 DIAGNOSIS — E7849 Other hyperlipidemia: Secondary | ICD-10-CM | POA: Insufficient documentation

## 2017-04-15 DIAGNOSIS — R7301 Impaired fasting glucose: Secondary | ICD-10-CM | POA: Insufficient documentation

## 2017-05-10 DIAGNOSIS — M654 Radial styloid tenosynovitis [de Quervain]: Secondary | ICD-10-CM | POA: Insufficient documentation

## 2017-05-12 ENCOUNTER — Other Ambulatory Visit: Payer: Self-pay | Admitting: Family Medicine

## 2017-05-12 DIAGNOSIS — Z139 Encounter for screening, unspecified: Secondary | ICD-10-CM

## 2017-06-07 ENCOUNTER — Ambulatory Visit
Admission: RE | Admit: 2017-06-07 | Discharge: 2017-06-07 | Disposition: A | Discharge status: Home / Self Care | Payer: Medicare HMO | Source: Ambulatory Visit | Attending: Family Medicine | Admitting: Family Medicine

## 2017-06-07 ENCOUNTER — Other Ambulatory Visit: Payer: Self-pay | Admitting: Family Medicine

## 2017-06-07 DIAGNOSIS — Z139 Encounter for screening, unspecified: Secondary | ICD-10-CM

## 2017-06-13 ENCOUNTER — Other Ambulatory Visit (HOSPITAL_COMMUNITY): Payer: Medicare HMO

## 2017-06-13 ENCOUNTER — Ambulatory Visit: Payer: Medicare HMO | Admitting: Family

## 2017-07-14 ENCOUNTER — Encounter: Payer: Self-pay | Admitting: Family

## 2017-07-14 ENCOUNTER — Ambulatory Visit (INDEPENDENT_AMBULATORY_CARE_PROVIDER_SITE_OTHER): Payer: Medicare HMO | Admitting: Family

## 2017-07-14 ENCOUNTER — Ambulatory Visit (HOSPITAL_COMMUNITY)
Admission: RE | Admit: 2017-07-14 | Discharge: 2017-07-14 | Disposition: A | Discharge status: Home / Self Care | Payer: Medicare HMO | Source: Ambulatory Visit | Attending: Family | Admitting: Family

## 2017-07-14 ENCOUNTER — Other Ambulatory Visit: Payer: Self-pay

## 2017-07-14 VITALS — BP 141/94 | HR 65 | Temp 98.1°F | Resp 20 | Ht 65.5 in | Wt 168.0 lb

## 2017-07-14 DIAGNOSIS — I714 Abdominal aortic aneurysm, without rupture, unspecified: Secondary | ICD-10-CM

## 2017-07-14 NOTE — Patient Instructions (Signed)
Abdominal Aortic Aneurysm Blood pumps away from the heart through tubes (blood vessels) called arteries. Aneurysms are weak or damaged places in the wall of an artery. It bulges out like a balloon. An abdominal aortic aneurysm happens in the main artery of the body (aorta). It can burst or tear, causing bleeding inside the body. This is an emergency. It needs treatment right away. What are the causes? The exact cause is unknown. Things that could cause this problem include:  Fat and other substances building up in the lining of a tube.  Swelling of the walls of a blood vessel.  Certain tissue diseases.  Belly (abdominal) trauma.  An infection in the main artery of the body.  What increases the risk? There are things that make it more likely for you to have an aneurysm. These include:  Being over the age of 74 years old.  Having high blood pressure (hypertension).  Being a female.  Being white.  Being very overweight (obese).  Having a family history of aneurysm.  Using tobacco products.  What are the signs or symptoms? Symptoms depend on the size of the aneurysm and how fast it grows. There may not be symptoms. If symptoms occur, they can include:  Pain (belly, side, lower back, or groin).  Feeling full after eating a small amount of food.  Feeling sick to your stomach (nauseous), throwing up (vomiting), or both.  Feeling a lump in your belly that feels like it is beating (pulsating).  Feeling like you will pass out (faint).  How is this treated?  Medicine to control blood pressure and pain.  Imaging tests to see if the aneurysm gets bigger.  Surgery. How is this prevented? To lessen your chance of getting this condition:  Stop smoking. Stop chewing tobacco.  Limit or avoid alcohol.  Keep your blood pressure, blood sugar, and cholesterol within normal limits.  Eat less salt.  Eat foods low in saturated fats and cholesterol. These are found in animal and  whole dairy products.  Eat more fiber. Fiber is found in whole grains, vegetables, and fruits.  Keep a healthy weight.  Stay active and exercise often.  This information is not intended to replace advice given to you by your health care provider. Make sure you discuss any questions you have with your health care provider. Document Released: 05/29/2012 Document Revised: 07/10/2015 Document Reviewed: 03/03/2012 Elsevier Interactive Patient Education  2017 Elsevier Inc.  

## 2017-07-14 NOTE — Progress Notes (Signed)
VASCULAR & VEIN SPECIALISTS OF [place]   CC: Follow up Abdominal Aortic Aneurysm  History of Present Illness  Kathryn [name] is a 72 y.o. (1946-01-20) female who initially presented to the hospital in 05/2016 with numbness in her left arm. She underwent a CT angiogram to rule out dissection. This was negative for dissection but did show a 4.4 x 4.7 infrarenal abdominal aortic aneurysm. During her visit she also had an MRI of the brain which was negative. She was ultimately diagnosed with cervical spondylolisthesis. She continues to have numbness in her left hand but this is improving.  The patient has been evaluated by cardiology in the past and has a negative stress test. She does have some shortness of breath. She takes a statin for hypercholesterolemia. Her blood pressure is well controlled on medication. She is a former smoker, but not currently.  She had an episode of loose stools that lasted a week and a half, started to subside 2 days ago; during and since then she has felt a contestant nagging abdominal pain; she was evaluated by GI re this, per pt.  She does complain of back pain which radiates down her left leg, this has remained stable, she denies any new back pain. 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    Dr. Trula Slade last evaluated pt on 12-13-16. At that time he discussed with the patient that he would consider elective repair once her aneurysm reaches 5 cm.  She was told to go to the emergency department should she develop severe abdominal pain. He advised pt to follow-up with an ultrasound in 6 months.  If the aneurysm remains less than 5 cm by ultrasound, she will follow-up to see Dr. Trula Slade in one year with a CT angiogram.   She did nit take her medications this morning including htn meds, to be NPO for the duplex, and her blood ressure is slightly elevated.     Diabetic: No Tobacco use: former smoker, quit in 2003, smoked x 10 years  Past  Medical History:  Diagnosis Date  . AAA (abdominal aortic aneurysm) ([place])   . Allergy   . Arthritis   . Asthma 2003  . Breast cancer ([place])    both breasts/lt. mastectomy  . Cancer ([place]) 2006   both breasts/cannot use rt. side  . Cataract    both eyes  . GERD (gastroesophageal reflux disease) 2003  . Heart murmur   . Hyperlipidemia    pt. stopped taking her medication for this /will attempt change in diet  . Hypertension 1978  . Migraine headache 2008   Past Surgical History:  Procedure Laterality Date  . ABDOMINAL HYSTERECTOMY  1989  . BREAST LUMPECTOMY Left   . BREAST SURGERY    . CHOLECYSTECTOMY  1985  . COLONOSCOPY    . lipoma removed      back  . MASTECTOMY Right    malignant  . POLYPECTOMY    . ROTATOR CUFF REPAIR  2007   lt. shoulder   Social History Social History   Socioeconomic History  . Marital status: Divorced    Spouse name: Not on file  . Number of children: Not on file  . Years of education: Not on file  . Highest education level: Not on file  Occupational History  . Not on file  Social Needs  . Financial resource strain: Not on file  . Food insecurity:    Worry: Not on file    Inability: Not  on file  . Transportation needs:    Medical: Not on file    Non-medical: Not on file  Tobacco Use  . Smoking status: Former Smoker    Packs/day: 1.00    Years: 10.00    Pack years: 10.00    Types: Cigarettes    Last attempt to quit: 05/16/2001    Years since quitting: 16.1  . Smokeless tobacco: Former Systems developer    Types: Snuff    Quit date: 10/21/2001  Substance and Sexual Activity  . Alcohol use: No    Alcohol/week: 0.0 oz  . Drug use: No  . Sexual activity: Not Currently    Partners: Male  Lifestyle  . Physical activity:    Days per week: Not on file    Minutes per session: Not on file  . Stress: Not on file  Relationships  . Social connections:    Talks on phone: Not on file    Gets together: Not on file    Attends religious service: Not on  file    Active member of club or organization: Not on file    Attends meetings of clubs or organizations: Not on file    Relationship status: Not on file  . Intimate partner violence:    Fear of current or ex partner: Not on file    Emotionally abused: Not on file    Physically abused: Not on file    Forced sexual activity: Not on file  Other Topics Concern  . Not on file  Social History Narrative  . Not on file   Family History Family History  Problem Relation Age of Onset  . Cancer Mother   . Heart failure Father   . Kidney failure Brother   . Hypertension Brother   . Heart disease Sister   . Stroke Sister   . Stroke Sister   . Colon cancer Neg Hx   . Colon polyps Neg Hx   . Esophageal cancer Neg Hx   . Stomach cancer Neg Hx   . Rectal cancer Neg Hx     Current Outpatient Medications on File Prior to Visit  Medication Sig Dispense Refill  . Acetaminophen (TYLENOL EXTRA STRENGTH PO) Take by mouth.    Marland Kitchen aspirin EC 81 MG tablet Take 1 tablet (81 mg total) by mouth daily.    Marland Kitchen BIOTIN PO Take 1 capsule by mouth daily.    Marland Kitchen diltiazem (CARDIZEM CD) 360 MG 24 hr capsule Take 1 capsule (360 mg total) by mouth daily. 90 capsule 0  . famotidine (PEPCID) 20 MG tablet One at bedtime    . fluticasone (FLONASE) 50 MCG/ACT nasal spray Place 2 sprays into both nostrils daily. 16 g 2  . gabapentin (NEURONTIN) 100 MG capsule Take by mouth.    . hydrochlorothiazide (HYDRODIURIL) 25 MG tablet Take 1 tablet (25 mg total) by mouth daily. Reported on 03/23/2015 90 tablet 1  . loratadine (CLARITIN) 10 MG tablet Take 1 tablet (10 mg total) by mouth daily. 30 tablet 11  . pantoprazole (PROTONIX) 40 MG tablet Take by mouth.    . potassium chloride SA (K-DUR,KLOR-CON) 20 MEQ tablet Take 2 tablets (40 mEq total) by mouth daily. 180 tablet 3  . senna (SENOKOT) 8.6 MG tablet Take 2 tablets by mouth 2 (two) times daily.     No current facility-administered medications on file prior to visit.     Allergies  Allergen Reactions  . Hydrocodone-Acetaminophen Itching  . Ivp Dye [Iodinated Diagnostic Agents] Hives  .  Adhesive [Tape] Rash  . Lisinopril Cough  . Lortab [Hydrocodone-Acetaminophen] Itching  . Milk-Related Compounds Other (See Comments)    Stomach pains, gas, bloating  . Sodium Hypochlorite Other (See Comments)    wheezing  . Sulfa Antibiotics Hives    ROS: See HPI for pertinent positives and negatives.  Physical Examination  Vitals:   07/14/17 0917  BP: (!) 141/94  Pulse: 65  Resp: 20  Temp: 98.1 F (36.7 C)  TempSrc: Oral  SpO2: 98%  Weight: 168 lb (76.2 kg)  Height: 5' 5.5" (1.664 m)   Body mass index is 27.53 kg/m.  General: A&O x 3, WD, female. HEENT: Grossly intact and WNL.  Pulmonary: Sym exp, respirations are non labored, good air movt, CTAB, no rales, rhonchi, or wheezing. Cardiac: Regular rhythm and rate, no detected murmur.  Carotid Bruits Right Left   Negative Negative   Adominal aortic pulse is not palpable Radial pulses are 2+                          VASCULAR EXAM:                                                                                                         LE Pulses Right Left       FEMORAL  2+ palpable  2+ palpable        POPLITEAL  2+ palpable   1+ palpable       POSTERIOR TIBIAL  2+ palpable   2+ palpable        DORSALIS PEDIS      ANTERIOR TIBIAL 2+ palpable  2+ palpable     Gastrointestinal: soft, NTND, -G/R, - HSM, - masses palpated, - CVAT B. Musculoskeletal: M/S 5/5 throughout, Extremities without ischemic changes. Skin: No rashes, no ulcers, no cellulitis.   Neurologic: CN 2-12 intact, Pain and light touch intact in extremities are intact, Motor exam as listed above. Psychiatric: Normal thought content, mood appropriate to clinical situation.    DATA  AAA Duplex (07/14/2017):  Previous size: 4.6 cm by CT (Date: 12-13-16)  Current size:  4.8 cm (Date: 07-14-17); Right CIA: 1.9 cm; Left CIA: 2.0  cm  Medical Decision Making  The patient is a 72 y.o. female who presents with asymptomatic AAA with a 2 mm increase in size in the last seven months, to 4.8 cm today.   Based on this patient's exam and diagnostic studies, the patient will follow up in 6 months  with the following studies: CTA abdpelvis, see Dr. Trula Slade afterward.  Consideration for repair of AAA would be made when the size is 5.0 cm, growth > 1 cm/yr, and symptomatic status.        The patient was given information about AAA including signs, symptoms, treatment, and how to minimize the risk of enlargement and rupture of aneurysms.    I emphasized the importance of maximal medical management including strict control of blood pressure, blood glucose, and lipid levels, antiplatelet agents, obtaining regular exercise, and continued  cessation  of smoking.   The patient was advised to call [phone] should the patient experience sudden onset abdominal or back pain.   Thank you for allowing [place] to participate in this patient's care.  Clemon Chambers, RN, MSN, FNP-C [facility] Special[facility] of [place] Office: [facility] Physician: Oneida Alar  07/14/2017, 9:25 AM

## 2017-07-25 ENCOUNTER — Other Ambulatory Visit: Payer: Self-pay | Admitting: Family Medicine

## 2017-07-29 ENCOUNTER — Other Ambulatory Visit: Payer: Self-pay | Admitting: Family

## 2017-07-29 ENCOUNTER — Other Ambulatory Visit: Payer: Self-pay

## 2017-07-29 DIAGNOSIS — I714 Abdominal aortic aneurysm, without rupture, unspecified: Secondary | ICD-10-CM

## 2017-08-11 ENCOUNTER — Other Ambulatory Visit: Payer: Self-pay | Admitting: Family

## 2017-08-16 ENCOUNTER — Inpatient Hospital Stay: Admission: RE | Admit: 2017-08-16 | Payer: Medicare HMO | Source: Ambulatory Visit

## 2017-09-13 IMAGING — DX DG CHEST 2V
2 series · 2 of 2 positions shown · non-contrast
Comparison: 08/20/2014.

CLINICAL DATA: Cough and fever.

EXAM:
CHEST  2 VIEW

[chest pa]
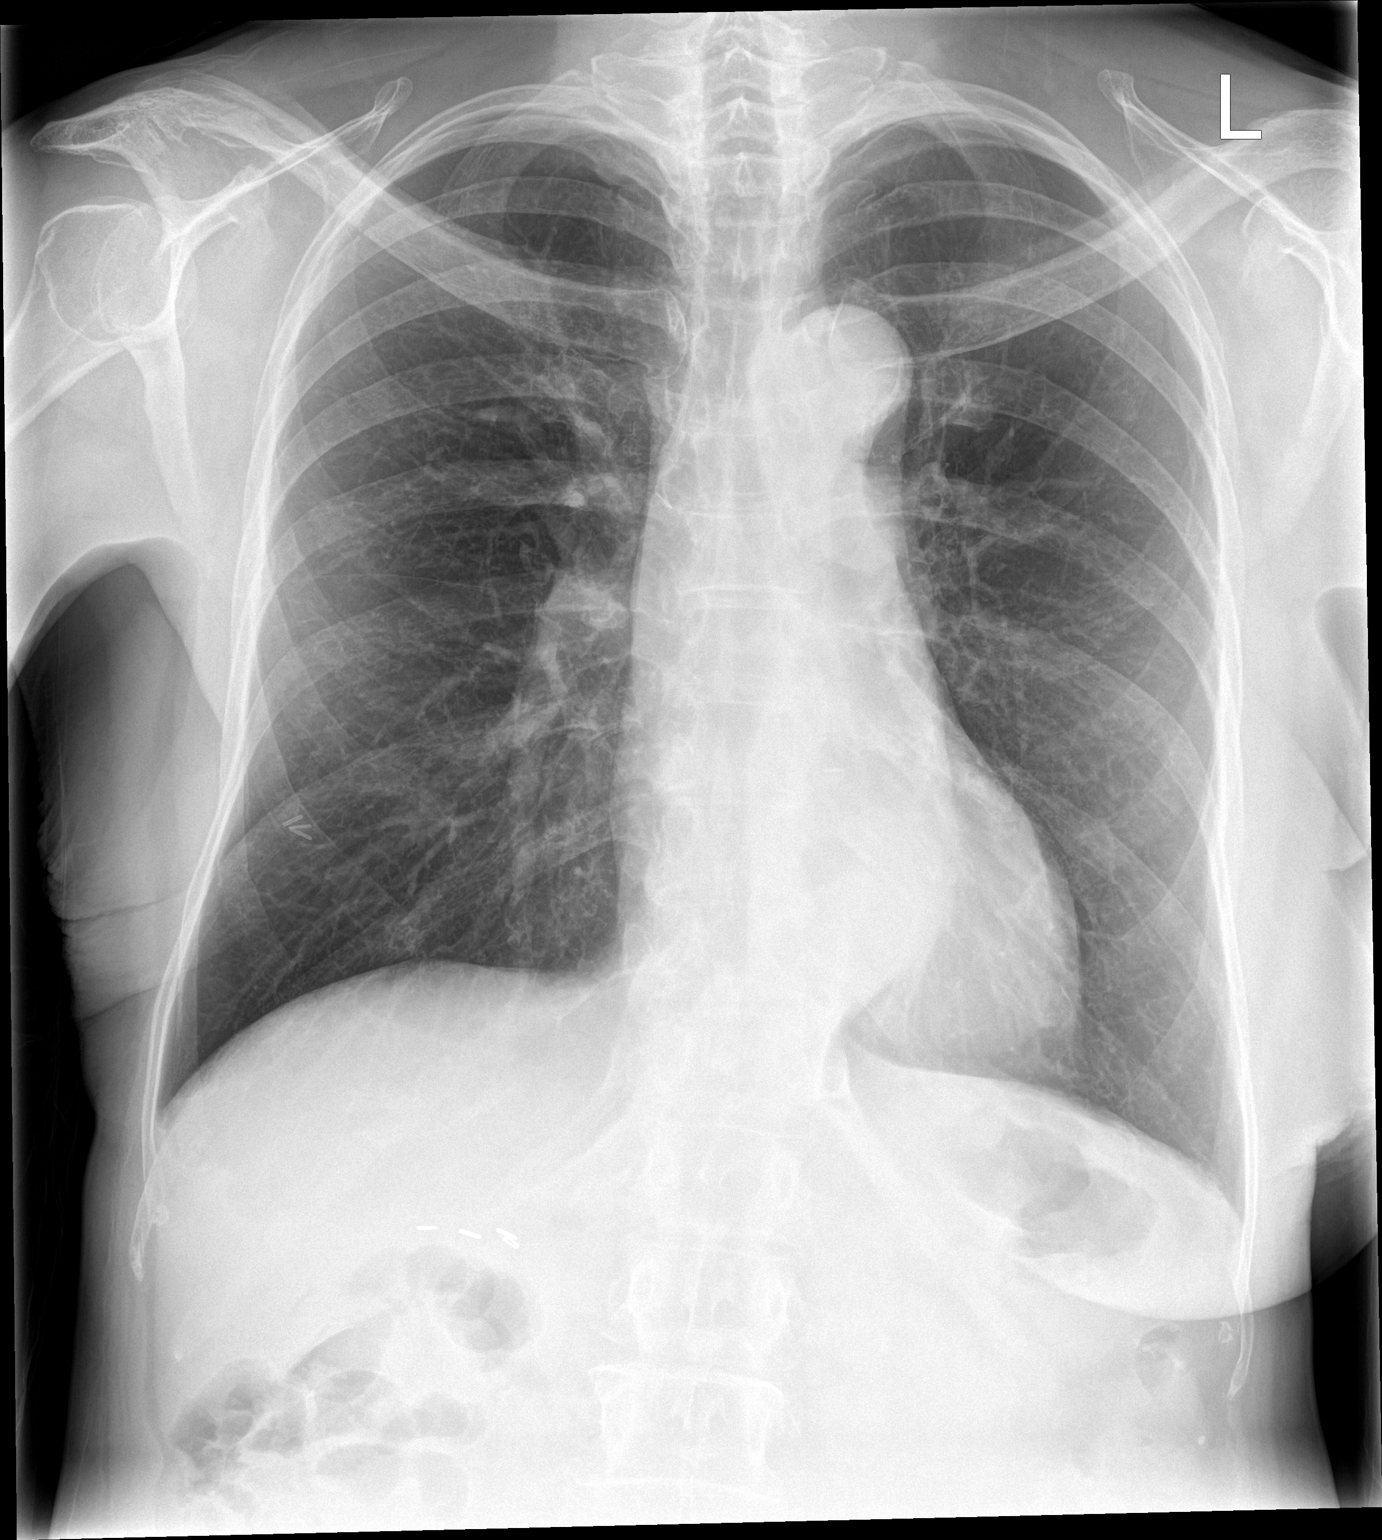

[chest lat]
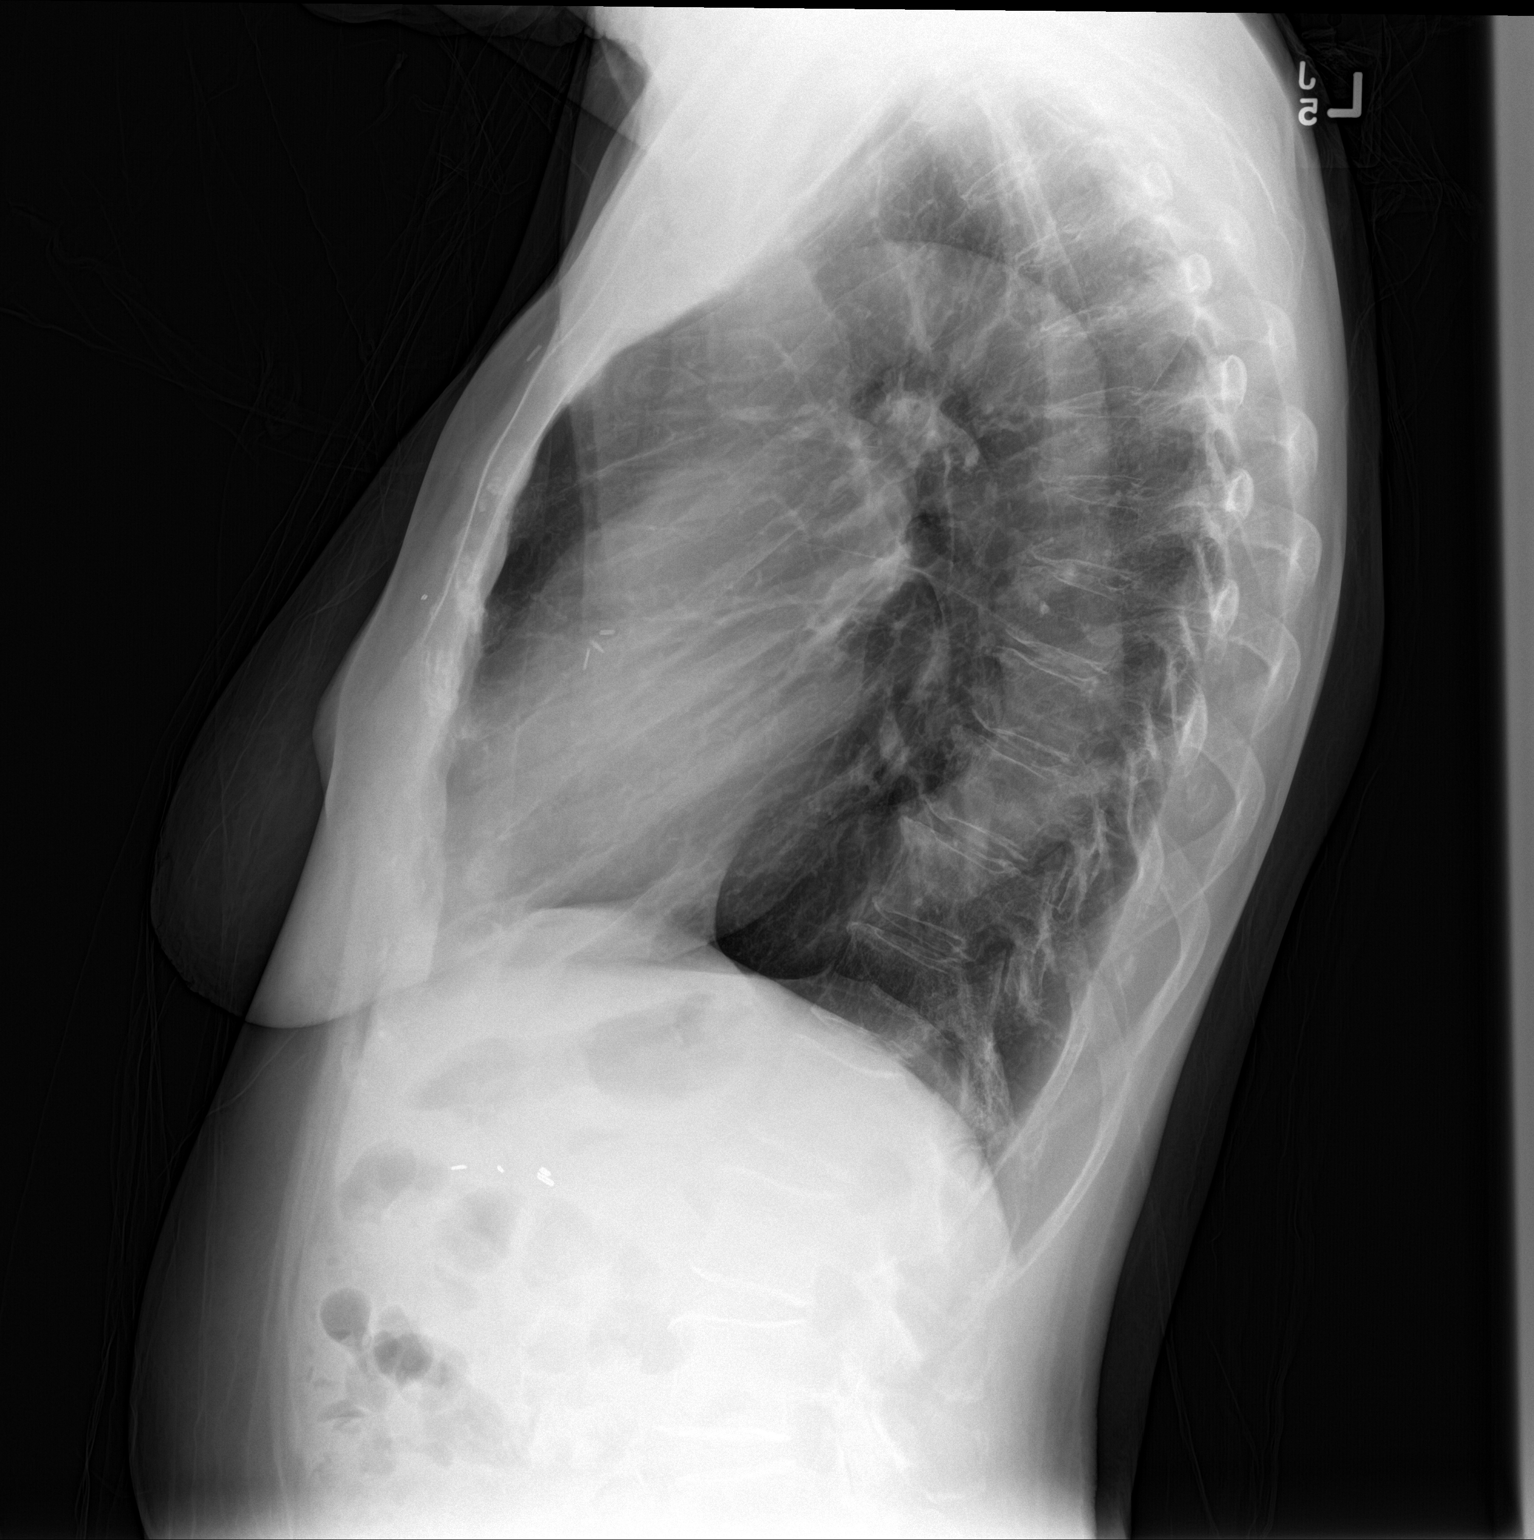

[2 of 2 positions shown; findings below may reference images not displayed]

FINDINGS: The heart size and mediastinal contours are within normal limits.
Both lungs are clear. Degenerative changes thoracic spine. Diffuse
osteopenia. Right mastectomy. Surgical clips right upper quadrant.
IMPRESSION: 1. Right mastectomy.
2. No acute cardiopulmonary disease.

## 2017-10-06 ENCOUNTER — Other Ambulatory Visit: Payer: Self-pay

## 2017-10-06 DIAGNOSIS — I714 Abdominal aortic aneurysm, without rupture, unspecified: Secondary | ICD-10-CM

## 2017-10-18 ENCOUNTER — Other Ambulatory Visit: Payer: Medicare HMO

## 2017-10-18 ENCOUNTER — Ambulatory Visit
Admission: RE | Admit: 2017-10-18 | Discharge: 2017-10-18 | Disposition: A | Discharge status: Home / Self Care | Payer: Medicare HMO | Source: Ambulatory Visit | Attending: Surgery | Admitting: Surgery

## 2017-10-18 DIAGNOSIS — I714 Abdominal aortic aneurysm, without rupture, unspecified: Secondary | ICD-10-CM

## 2017-10-18 MED ORDER — IOPAMIDOL (ISOVUE-370) INJECTION 76%
75.0000 mL | Freq: Once | INTRAVENOUS | Status: AC | PRN
  Administered 2017-10-18: 75 mL via INTRAVENOUS

## 2017-10-27 ENCOUNTER — Other Ambulatory Visit: Payer: Self-pay

## 2017-10-27 ENCOUNTER — Encounter: Payer: Self-pay | Admitting: Family

## 2017-10-27 ENCOUNTER — Ambulatory Visit (INDEPENDENT_AMBULATORY_CARE_PROVIDER_SITE_OTHER): Payer: Medicare HMO | Admitting: Family

## 2017-10-27 VITALS — BP 135/85 | HR 59 | Temp 98.4°F | Resp 16 | Ht 65.5 in | Wt 172.0 lb

## 2017-10-27 DIAGNOSIS — Z720 Tobacco use: Secondary | ICD-10-CM

## 2017-10-27 DIAGNOSIS — I714 Abdominal aortic aneurysm, without rupture, unspecified: Secondary | ICD-10-CM

## 2017-10-27 NOTE — Patient Instructions (Addendum)
. What You Need to Know About Smokeless Tobacco Use Tobacco use is one of the leading causes of cancer and other chronic health problems. Smokeless tobacco is tobacco that is put directly into the mouth instead of being smoked. It may also be called chewing tobacco or snuff. Smokeless tobacco is made from the leaves of tobacco plants and it comes in several forms:  Loose, dry leaves, plugs, or twists.  Moist pouches.  Dissolving lozenges or strips.  Chewing, sucking, or holding the tobacco in your mouth causes your mouth to make more saliva. The saliva mixes with the tobacco to make "tobacco juice" that is swallowed or spit out. How can smokeless tobacco affect me? Using smokeless tobacco:  Increases your risk of developing cancer. Smokeless tobacco contains at least 28 different types of cancer-causing chemicals (carcinogens).  Increases your chances of developing other long-term health problems, including high blood pressure, heart disease, stroke, and dental problems.  Can make you become addicted. Nicotine is one of the chemicals in tobacco. When you chew tobacco, you absorb nicotine from the tobacco juice. This can make you feel more alert than usual.  Can cause problems with pregnancy. Pregnant women who use smokeless tobacco are more likely to miscarry or deliver a baby too early (premature delivery).  Can affect the appearance and health of your mouth. Using smokeless tobacco may cause bad breath, yellow-brown teeth, mouth sores, cracking and bleeding lips, gum recession, and lesions on the soft tissues of your mouth (leukoplakia).  What are the benefits of not using smokeless tobacco? The benefits of not using smokeless tobacco include:  A healthy mind because: ? You avoid addiction.  A healthy body because: ? You avoid dental problems. ? You promote healthy pregnancy. ? You avoid long-term health problems.  A healthy wallet because: ? You avoid costs of buying  tobacco. ? You avoid health care costs in the future.  A healthy family because: ? You avoid accidental poisoning of children in your household.  What can happen if I continue to use smokeless tobacco? If you continue to use smokeless tobacco, you will increase your risk for developing certain cancers. These include:  Tongue.  Lips, mouth, and gums.  Throat (esophagus) and voice box (larynx).  Stomach.  Pancreas.  Bladder.  Colon.  Long-term use of smokeless tobacco can also lead to:  High blood pressure, heart disease, and stroke.  Gum disease, gum recession, and bone loss around the teeth.  Tooth decay.  How do I quit using smokeless tobacco? Quitting the use of smokeless tobacco can be hard, but it can be done. Follow these steps:  Pick a date to quit. Set a date within the next two weeks. This gives you time to prepare.  Write down the reasons why you are quitting. Keep this list in places where you will see it often, such as on your bathroom mirror or in your car or wallet.  Identify the people, places, things, and activities that make you want to use tobacco (triggers) and avoid them.  Get rid of any tobacco you have and remove any tobacco smells. To do this: ? Throw away all containers of tobacco at home, at work, and in your car. ? Throw away any other items that you use regularly when you chew tobacco. ? Clean your car and make sure to remove all tobacco-related items. ? Clean your home, including curtains and carpets.  Tell your family, friends, and coworkers that you are quitting. This can make   quitting easier.  Ask your health care provider for help quitting smokeless tobacco. This may involve treatment. Find out what treatment options are covered by your health insurance.  Keep track of how many days have passed since you quit. Remembering how long and hard you have worked to quit can help you avoid using tobacco again.  Where can I get support? Ask  your health care provider if there is a local support group for quitting smokeless tobacco. Where can I get more information? You can learn more about the risks of using smokeless tobacco and the benefits of quitting from these sources:  National Cancer Institute: www.cancer.gov  American Cancer Society: www.cancer.org  When should I seek medical care? Seek medical care if you have:  White or other discolored patches in your mouth.  Difficulty swallowing.  A change in your voice.  Unexplained weight loss.  Stomach pain, nausea, or vomiting.  Summary  Smokeless tobacco contains at least 28 different chemicals that are known to cause cancer (carcinogen).  Nicotine is an addictive chemical in smokeless tobacco.  When you quit using smokeless tobacco, you lower your risk of developing cancer. This information is not intended to replace advice given to you by your health care provider. Make sure you discuss any questions you have with your health care provider. Document Released: 07/06/2010 Document Revised: 09/27/2015 Document Reviewed: 09/13/2014 Elsevier Interactive Patient Education  2018 Elsevier Inc.    Abdominal Aortic Aneurysm Blood pumps away from the heart through tubes (blood vessels) called arteries. Aneurysms are weak or damaged places in the wall of an artery. It bulges out like a balloon. An abdominal aortic aneurysm happens in the main artery of the body (aorta). It can burst or tear, causing bleeding inside the body. This is an emergency. It needs treatment right away. What are the causes? The exact cause is unknown. Things that could cause this problem include:  Fat and other substances building up in the lining of a tube.  Swelling of the walls of a blood vessel.  Certain tissue diseases.  Belly (abdominal) trauma.  An infection in the main artery of the body.  What increases the risk? There are things that make it more likely for you to have an  aneurysm. These include:  Being over the age of 74 years old.  Having high blood pressure (hypertension).  Being a female.  Being white.  Being very overweight (obese).  Having a family history of aneurysm.  Using tobacco products.  What are the signs or symptoms? Symptoms depend on the size of the aneurysm and how fast it grows. There may not be symptoms. If symptoms occur, they can include:  Pain (belly, side, lower back, or groin).  Feeling full after eating a small amount of food.  Feeling sick to your stomach (nauseous), throwing up (vomiting), or both.  Feeling a lump in your belly that feels like it is beating (pulsating).  Feeling like you will pass out (faint).  How is this treated?  Medicine to control blood pressure and pain.  Imaging tests to see if the aneurysm gets bigger.  Surgery. How is this prevented? To lessen your chance of getting this condition:  Stop smoking. Stop chewing tobacco.  Limit or avoid alcohol.  Keep your blood pressure, blood sugar, and cholesterol within normal limits.  Eat less salt.  Eat foods low in saturated fats and cholesterol. These are found in animal and whole dairy products.  Eat more fiber. Fiber is found in   whole grains, vegetables, and fruits.  Keep a healthy weight.  Stay active and exercise often.  This information is not intended to replace advice given to you by your health care provider. Make sure you discuss any questions you have with your health care provider. Document Released: 05/29/2012 Document Revised: 07/10/2015 Document Reviewed: 03/03/2012 Elsevier Interactive Patient Education  2017 Elsevier Inc.  

## 2017-10-27 NOTE — Progress Notes (Signed)
[facility]   CC: Follow up Abdominal Aortic Aneurysm  History of Present Illness  Kathryn [name] is a 72 y.o. (04/07/1945) female who initiallypresented to the hospital in 4/2018with numbness in her left arm. She underwent a CT angiogram to rule out dissection. This was negative for dissection but did show a 4.4 x 4.7 infrarenal abdominal aortic aneurysm. During her visit she also had an MRI of the brain which was negative. She was ultimately diagnosed with cervical spondylolisthesis. She continues to have numbness in her left hand but this is improving.  The patient has been evaluated by cardiology in the past and had a negative stress test. She does have some shortness of breath. She takes a statin for hypercholesterolemia. Her blood pressure is well controlled on medication.   She had an episode of loose stools that lasted a week and a half, started to subside in May of 2019; during and since then she has felt a contestant nagging abdominal pain; she was evaluated by GI re this, per pt.  She does complain of back pain which radiates down her left leg, this has remained stable, she denies any new back pain. 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    Dr. Trula Slade last evaluated pt on 12-13-16. At that time he discussed with the patient that he would consider elective repair once her aneurysm reaches 5 cm. She was told to go to the emergency department should she develop severe abdominal pain. He advised pt to follow-up with an ultrasound in 6 months. If the aneurysm remains less than 5 cm by ultrasound, she will follow-up to see Dr. Trula Slade in one year with a CT angiogram.   I last evaluated pt on 07-14-17 and advised CTA abd/pelvis to be done in 6 months and see Dr. Trula Slade afterward. Instead, pt had CTA 4 months later and is seeing me today.   Diabetic: No Tobacco use: former smoker, quit in 2003, smoked x 10 years . Started  smokeless tobacco in 2003 to the present   Past Medical History:  Diagnosis Date  . AAA (abdominal aortic aneurysm) ([place])   . Allergy   . Arthritis   . Asthma 2003  . Breast cancer ([place])    both breasts/lt. mastectomy  . Cancer ([place]) 2006   both breasts/cannot use rt. side  . Cataract    both eyes  . GERD (gastroesophageal reflux disease) 2003  . Heart murmur   . Hyperlipidemia    pt. stopped taking her medication for this /will attempt change in diet  . Hypertension 1978  . Migraine headache 2008   Past Surgical History:  Procedure Laterality Date  . ABDOMINAL HYSTERECTOMY  1989  . BREAST LUMPECTOMY Left   . BREAST SURGERY    . CHOLECYSTECTOMY  1985  . COLONOSCOPY    . lipoma removed      back  . MASTECTOMY Right    malignant  . POLYPECTOMY    . ROTATOR CUFF REPAIR  2007   lt. shoulder   Social History Social History   Socioeconomic History  . Marital status: Divorced    Spouse name: Not on file  . Number of children: Not on file  . Years of education: Not on file  . Highest education level: Not on file  Occupational History  . Not on file  Social Needs  . Financial resource strain: Not on file  . Food insecurity:    Worry: Not  on file    Inability: Not on file  . Transportation needs:    Medical: Not on file    Non-medical: Not on file  Tobacco Use  . Smoking status: Former Smoker    Packs/day: 1.00    Years: 10.00    Pack years: 10.00    Types: Cigarettes    Last attempt to quit: 05/16/2001    Years since quitting: 16.4  . Smokeless tobacco: Former Systems developer    Types: Snuff    Quit date: 10/21/2001  Substance and Sexual Activity  . Alcohol use: No    Alcohol/week: 0.0 standard drinks  . Drug use: No  . Sexual activity: Not Currently    Partners: Male  Lifestyle  . Physical activity:    Days per week: Not on file    Minutes per session: Not on file  . Stress: Not on file  Relationships  . Social connections:    Talks on phone: Not on file     Gets together: Not on file    Attends religious service: Not on file    Active member of club or organization: Not on file    Attends meetings of clubs or organizations: Not on file    Relationship status: Not on file  . Intimate partner violence:    Fear of current or ex partner: Not on file    Emotionally abused: Not on file    Physically abused: Not on file    Forced sexual activity: Not on file  Other Topics Concern  . Not on file  Social History Narrative  . Not on file   Family History Family History  Problem Relation Age of Onset  . Cancer Mother   . Heart failure Father   . Kidney failure Brother   . Hypertension Brother   . Heart disease Sister   . Stroke Sister   . Stroke Sister   . Colon cancer Neg Hx   . Colon polyps Neg Hx   . Esophageal cancer Neg Hx   . Stomach cancer Neg Hx   . Rectal cancer Neg Hx     Current Outpatient Medications on File Prior to Visit  Medication Sig Dispense Refill  . diltiazem (CARDIZEM CD) 360 MG 24 hr capsule Take 1 capsule (360 mg total) by mouth daily. 90 capsule 0  . fluticasone (FLONASE) 50 MCG/ACT nasal spray Place 2 sprays into both nostrils daily. 16 g 2  . gabapentin (NEURONTIN) 100 MG capsule Take by mouth.    . loratadine (CLARITIN) 10 MG tablet Take 1 tablet (10 mg total) by mouth daily. 30 tablet 11  . potassium chloride SA (K-DUR,KLOR-CON) 20 MEQ tablet Take 20 mEq by mouth daily.    Marland Kitchen senna (SENOKOT) 8.6 MG tablet Take 2 tablets by mouth 2 (two) times daily.     No current facility-administered medications on file prior to visit.    Allergies  Allergen Reactions  . Ivp Dye [Iodinated Diagnostic Agents] Hives  . Sulfa Antibiotics Hives  . Adhesive [Tape] Rash  . Hydrocodone-Acetaminophen Itching  . Lisinopril Cough  . Lortab [Hydrocodone-Acetaminophen] Itching  . Milk-Related Compounds Other (See Comments)    Stomach pains, gas, bloating  . Sodium Hypochlorite Other (See Comments)    wheezing    ROS: See HPI  for pertinent positives and negatives.  Physical Examination  Vitals:   10/27/17 1502  BP: 135/85  Pulse: (!) 59  Resp: 16  Temp: 98.4 F (36.9 C)  TempSrc: Oral  SpO2:  97%  Weight: 172 lb (78 kg)  Height: 5' 5.5" (1.664 m)   Body mass index is 28.19 kg/m.  General: A&O x 3, WD, female. HEENT: Grossly intact and WNL.  Pulmonary: Sym exp, respirations are non labored, good air movement in all fields, CTAB, no rales, rhonchi, or wheezing. Cardiac: Regular rhythm and rate, no detected murmur.  Carotid Bruits Right Left   Negative Negative   Adominal aortic pulse is not palpable Radial pulses are 2+                          VASCULAR EXAM:                                                                                                                                           LE Pulses Right Left       FEMORAL  2+ palpable  2+ palpable        POPLITEAL  2+ palpable   1+ palpable       POSTERIOR TIBIAL  2+ palpable   2+ palpable        DORSALIS PEDIS      ANTERIOR TIBIAL 2+ palpable  2+ palpable    Gastrointestinal: soft, NTND, -G/R, - HSM, - masses palpated, - CVAT B. Musculoskeletal: M/S 5/5 throughout, Extremities without ischemic changes. Skin: No rashes, no ulcers, no cellulitis.   Neurologic: CN 2-12 intact, Pain and light touch intact in extremities are intact, Motor exam as listed above. Psychiatric: Normal thought content, mood appropriate to clinical situation.     DATA  AAA Duplex Previous size:  4.8 cm (Date: 07-14-17); Right CIA: 1.9 cm; Left CIA: 2.0 cm   CTA abd/pelvis (10-18-17): Infrarenal abdominal aortic aneurysm. Maximal diameter is increased from 4.6 cm to 5.0 cm. Recommend followup by abdomen and pelvis CTA in 3-6 months, and vascular surgery referral/consultation if not already obtained. This recommendation follows ACR consensus guidelines: White Paper of the ACR Incidental Findings Committee II on Vascular Findings. J Am Coll  Radiol 2013; 10:789-794. 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    4.6 cm by CT (Date: 12-13-16)    Medical Decision Making  The patient is a 72 y.o. female who presents with asymptomatic AAA with increase in size to 5 cm by CT on 10-18-17 from 4.6 cm by CT on 12-13-16.  Over 3 minutes was spent counseling patient re smokeless tobacco cessation, and patient was given several free resources re this.    Based on this patient's exam and diagnostic studies, the patient will follow up with Dr. Trula Slade at his first available office appointment to discuss possible AAA repair.  Consideration for repair of AAA would be made when the size is 5.0 cm, growth > 1 cm/yr, and symptomatic status.        The patient was given information about AAA including signs, symptoms, treatment, and how to minimize the risk of enlargement and rupture of aneurysms.    I emphasized the importance of maximal medical management including strict control of blood pressure, blood glucose, and lipid levels, antiplatelet agents, obtaining regular exercise, and cessation of tobacco use.   The patient was advised to call 911 should the patient experience sudden onset abdominal or back pain.   Thank you for allowing [place] to participate in this patient's care.  Clemon Chambers, RN, MSN, FNP-C [facility] [facility] Office: [phone]  Clinic Physician: Oneida Alar  10/27/2017, 3:09 PM

## 2017-11-07 ENCOUNTER — Encounter: Payer: Self-pay | Admitting: *Deleted

## 2017-11-07 ENCOUNTER — Other Ambulatory Visit: Payer: Self-pay | Admitting: *Deleted

## 2017-11-07 ENCOUNTER — Encounter: Payer: Self-pay | Admitting: Surgery

## 2017-11-07 ENCOUNTER — Other Ambulatory Visit: Payer: Self-pay

## 2017-11-07 ENCOUNTER — Ambulatory Visit (INDEPENDENT_AMBULATORY_CARE_PROVIDER_SITE_OTHER): Payer: Medicare HMO | Admitting: Surgery

## 2017-11-07 VITALS — BP 132/82 | HR 63 | Resp 18 | Ht 65.5 in | Wt 173.0 lb

## 2017-11-07 DIAGNOSIS — I714 Abdominal aortic aneurysm, without rupture, unspecified: Secondary | ICD-10-CM

## 2017-11-07 NOTE — H&P (View-Only) (Signed)
[facility]  Patient name: Kathryn [name] MRN: [id number] DOB: 1945/07/01 Sex: female   REASON FOR VISIT:    Follow up AAA  HISOTRY OF PRESENT ILLNESS:   Kathryn [name] a 72 y.o.female, who is is referred forevaluation of an abdominal aortic aneurysm. She initially presented to the hospital in 05/2016 with numbness in her left arm. She underwent a CT angiogram to rule out dissection. This was negative for dissection but did show a 4.4 x 4.7 infrarenal abdominal aortic aneurysm. During her visit she also had an MRI of the brain which was negative. She was ultimately diagnosed with cervical spondylolisthesis. She continues to have numbness in her left hand but this is improving.  The patient has been evaluated by cardiology in the past and has a negative stress test. She does have some shortness of breath. She takes a statin for hypercholesterolemia. Her blood pressure is well controlled on medication. She is a former smoker, but not currently.  She has been having back pain since this weekend which radiates around to her thighs.  PAST MEDICAL HISTORY:   Past Medical History:  Diagnosis Date  . AAA (abdominal aortic aneurysm) ([place])   . Allergy   . Arthritis   . Asthma 2003  . Breast cancer ([place])    both breasts/lt. mastectomy  . Cancer ([place]) 2006   both breasts/cannot use rt. side  . Cataract    both eyes  . GERD (gastroesophageal reflux disease) 2003  . Heart murmur   . Hyperlipidemia    pt. stopped taking her medication for this /will attempt change in diet  . Hypertension 1978  . Migraine headache 2008     FAMILY HISTORY:   Family History  Problem Relation Age of Onset  . Cancer Mother   . Heart failure Father   . Kidney failure Brother   . Hypertension Brother   . Heart disease Sister   . Stroke Sister   . Stroke Sister   . Colon cancer Neg Hx   . Colon polyps Neg Hx   . Esophageal  cancer Neg Hx   . Stomach cancer Neg Hx   . Rectal cancer Neg Hx     SOCIAL HISTORY:   Social History   Tobacco Use  . Smoking status: Former Smoker    Packs/day: 1.00    Years: 10.00    Pack years: 10.00    Types: Cigarettes    Last attempt to quit: 05/16/2001    Years since quitting: 16.4  . Smokeless tobacco: Former Systems developer    Types: Snuff    Quit date: 10/21/2001  Substance Use Topics  . Alcohol use: No    Alcohol/week: 0.0 standard drinks     ALLERGIES:   Allergies  Allergen Reactions  . Ivp Dye [Iodinated Diagnostic Agents] Hives  . Sulfa Antibiotics Hives  . Adhesive [Tape] Rash  . Hydrocodone-Acetaminophen Itching  . Lisinopril Cough  . Lortab [Hydrocodone-Acetaminophen] Itching  . Milk-Related Compounds Other (See Comments)    Stomach pains, gas, bloating  . Sodium Hypochlorite Other (See Comments)    wheezing     CURRENT MEDICATIONS:   Current Outpatient Medications  Medication Sig Dispense Refill  . diltiazem (CARDIZEM CD) 360 MG 24 hr capsule Take 1 capsule (360 mg total) by mouth daily. 90 capsule 0  . fluticasone (FLONASE) 50 MCG/ACT nasal spray Place 2 sprays into both nostrils daily. 16 g 2  . gabapentin (NEURONTIN) 100 MG capsule Take by mouth.    Marland Kitchen  loratadine (CLARITIN) 10 MG tablet Take 1 tablet (10 mg total) by mouth daily. 30 tablet 11  . potassium chloride SA (K-DUR,KLOR-CON) 20 MEQ tablet Take 20 mEq by mouth daily.    Marland Kitchen senna (SENOKOT) 8.6 MG tablet Take 2 tablets by mouth 2 (two) times daily.     No current facility-administered medications for this visit.     REVIEW OF SYSTEMS:   [X]  denotes positive finding, [ ]  denotes negative finding Cardiac  Comments:  Chest pain or chest pressure:    Shortness of breath upon exertion: x   Short of breath when lying flat:    Irregular heart rhythm: x       Vascular    Pain in calf, thigh, or hip brought on by ambulation:    Pain in feet at night that wakes you up from your sleep:     Blood  clot in your veins:    Leg swelling:  x       Pulmonary    Oxygen at home:    Productive cough:     Wheezing:         Neurologic    Sudden weakness in arms or legs:     Sudden numbness in arms or legs:  x   Sudden onset of difficulty speaking or slurred speech:    Temporary loss of vision in one eye:     Problems with dizziness:         Gastrointestinal    Blood in stool:     Vomited blood:         Genitourinary    Burning when urinating:     Blood in urine:        Psychiatric    Major depression:         Hematologic    Bleeding problems:    Problems with blood clotting too easily:        Skin    Rashes or ulcers:        Constitutional    Fever or chills:      PHYSICAL EXAM:   Vitals:   11/07/17 1145  BP: 132/82  Pulse: 63  Resp: 18  SpO2: 96%  Weight: 173 lb (78.5 kg)  Height: 5' 5.5" (1.664 m)    GENERAL: The patient is a well-nourished female, in no acute distress. The vital signs are documented above. CARDIAC: There is a regular rate and rhythm.  VASCULAR: Palpable pedal pulses PULMONARY: Non-labored respirations ABDOMEN: Soft MUSCULOSKELETAL: There are no major deformities or cyanosis. NEUROLOGIC: No focal weakness or paresthesias are detected. SKIN: There are no ulcers or rashes noted. PSYCHIATRIC: The patient has a normal affect.  STUDIES:   I have reviewed her CTA with the following findings:  Infrarenal abdominal aortic aneurysm. Maximal diameter is increased from 4.6 cm to 5.0 cm. Recommend followup by abdomen and pelvis CTA in 3-6 months, and vascular surgery referral/consultation if not already obtained. This recommendation follows ACR consensus guidelines: White Paper of the ACR Incidental Findings Committee II on Vascular Findings. J Am Coll Radiol 2013; 10:789-794.  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  Right common iliac artery aneurysm is stable at 1.9  cm.  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   MEDICAL ISSUES:   AAA: The patient's aorta is tender to palpation today.  Given that she now meets size criteria for  repair, I think this needs to be done expeditiously.  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  I am uncomfortable not getting this done this week.  I discussed this with the patient and she wants to proceed.  We discussed the risks and benefits the operation, including the risk of death cardiopulmonary complications, and ischemia.  All her questions were answered.  She is on the schedule for Thursday.    [name], MD [facility] [phone] Pager [phone]

## 2017-11-07 NOTE — Progress Notes (Signed)
Vascular and Vein Specialist of [facility]  Patient name: Kathryn [name] MRN: [id number] DOB: [birth year] Sex: female   REASON FOR VISIT:    Follow up AAA  HISOTRY OF PRESENT ILLNESS:   Kathryn McBrideis a 72 y.o.female, who is is referred forevaluation of an abdominal aortic aneurysm. She initially presented to the hospital in 05/2016 with numbness in her left arm. She underwent a CT angiogram to rule out dissection. This was negative for dissection but did show a 4.4 x 4.7 infrarenal abdominal aortic aneurysm. During her visit she also had an MRI of the brain which was negative. She was ultimately diagnosed with cervical spondylolisthesis. She continues to have numbness in her left hand but this is improving.  The patient has been evaluated by cardiology in the past and has a negative stress test. She does have some shortness of breath. She takes a statin for hypercholesterolemia. Her blood pressure is well controlled on medication. She is a former smoker, but not currently.  She has been having back pain since this weekend which radiates around to her thighs.  PAST MEDICAL HISTORY:   Past Medical History:  Diagnosis Date  . AAA (abdominal aortic aneurysm) ([place])   . Allergy   . Arthritis   . Asthma 2003  . Breast cancer ([place])    both breasts/lt. mastectomy  . Cancer ([place]) 2006   both breasts/cannot use rt. side  . Cataract    both eyes  . GERD (gastroesophageal reflux disease) 2003  . Heart murmur   . Hyperlipidemia    pt. stopped taking her medication for this /will attempt change in diet  . Hypertension 1978  . Migraine headache 2008     FAMILY HISTORY:   Family History  Problem Relation Age of Onset  . Cancer Mother   . Heart failure Father   . Kidney failure Brother   . Hypertension Brother   . Heart disease Sister   . Stroke Sister   . Stroke Sister   . Colon cancer Neg Hx   . Colon polyps Neg Hx   . Esophageal  cancer Neg Hx   . Stomach cancer Neg Hx   . Rectal cancer Neg Hx     SOCIAL HISTORY:   Social History   Tobacco Use  . Smoking status: Former Smoker    Packs/day: 1.00    Years: 10.00    Pack years: 10.00    Types: Cigarettes    Last attempt to quit: 05/16/2001    Years since quitting: 16.4  . Smokeless tobacco: Former Systems developer    Types: Snuff    Quit date: 10/21/2001  Substance Use Topics  . Alcohol use: No    Alcohol/week: 0.0 standard drinks     ALLERGIES:   Allergies  Allergen Reactions  . Ivp Dye [Iodinated Diagnostic Agents] Hives  . Sulfa Antibiotics Hives  . Adhesive [Tape] Rash  . Hydrocodone-Acetaminophen Itching  . Lisinopril Cough  . Lortab [Hydrocodone-Acetaminophen] Itching  . Milk-Related Compounds Other (See Comments)    Stomach pains, gas, bloating  . Sodium Hypochlorite Other (See Comments)    wheezing     CURRENT MEDICATIONS:   Current Outpatient Medications  Medication Sig Dispense Refill  . diltiazem (CARDIZEM CD) 360 MG 24 hr capsule Take 1 capsule (360 mg total) by mouth daily. 90 capsule 0  . fluticasone (FLONASE) 50 MCG/ACT nasal spray Place 2 sprays into both nostrils daily. 16 g 2  . gabapentin (NEURONTIN) 100 MG capsule Take by mouth.    Marland Kitchen  loratadine (CLARITIN) 10 MG tablet Take 1 tablet (10 mg total) by mouth daily. 30 tablet 11  . potassium chloride SA (K-DUR,KLOR-CON) 20 MEQ tablet Take 20 mEq by mouth daily.    Marland Kitchen senna (SENOKOT) 8.6 MG tablet Take 2 tablets by mouth 2 (two) times daily.     No current facility-administered medications for this visit.     REVIEW OF SYSTEMS:   [X]  denotes positive finding, [ ]  denotes negative finding Cardiac  Comments:  Chest pain or chest pressure:    Shortness of breath upon exertion: x   Short of breath when lying flat:    Irregular heart rhythm: x       Vascular    Pain in calf, thigh, or hip brought on by ambulation:    Pain in feet at night that wakes you up from your sleep:     Blood  clot in your veins:    Leg swelling:  x       Pulmonary    Oxygen at home:    Productive cough:     Wheezing:         Neurologic    Sudden weakness in arms or legs:     Sudden numbness in arms or legs:  x   Sudden onset of difficulty speaking or slurred speech:    Temporary loss of vision in one eye:     Problems with dizziness:         Gastrointestinal    Blood in stool:     Vomited blood:         Genitourinary    Burning when urinating:     Blood in urine:        Psychiatric    Major depression:         Hematologic    Bleeding problems:    Problems with blood clotting too easily:        Skin    Rashes or ulcers:        Constitutional    Fever or chills:      PHYSICAL EXAM:   Vitals:   11/07/17 1145  BP: 132/82  Pulse: 63  Resp: 18  SpO2: 96%  Weight: 173 lb (78.5 kg)  Height: 5' 5.5" (1.664 m)    GENERAL: The patient is a well-nourished female, in no acute distress. The vital signs are documented above. CARDIAC: There is a regular rate and rhythm.  VASCULAR: Palpable pedal pulses PULMONARY: Non-labored respirations ABDOMEN: Soft MUSCULOSKELETAL: There are no major deformities or cyanosis. NEUROLOGIC: No focal weakness or paresthesias are detected. SKIN: There are no ulcers or rashes noted. PSYCHIATRIC: The patient has a normal affect.  STUDIES:   I have reviewed her CTA with the following findings:  Infrarenal abdominal aortic aneurysm. Maximal diameter is increased from 4.6 cm to 5.0 cm. Recommend followup by abdomen and pelvis CTA in 3-6 months, and vascular surgery referral/consultation if not already obtained. This recommendation follows ACR consensus guidelines: White Paper of the ACR Incidental Findings Committee II on Vascular Findings. J Am Coll Radiol 2013; 10:789-794.  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  Right common iliac artery aneurysm is stable at 1.9  cm.  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   MEDICAL ISSUES:   AAA: The patient's aorta is tender to palpation today.  Given that she now meets size criteria for  repair, I think this needs to be done expeditiously.  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  I am uncomfortable not getting this done this week.  I discussed this with the patient and she wants to proceed.  We discussed the risks and benefits the operation, including the risk of death cardiopulmonary complications, and ischemia.  All her questions were answered.  She is on the schedule for Thursday.    [name], MD Vascular and Vein Specialists of [facility] [phone] Pager [phone]

## 2017-11-09 ENCOUNTER — Encounter (HOSPITAL_COMMUNITY): Payer: Self-pay | Admitting: Certified Registered Nurse Anesthetist

## 2017-11-09 ENCOUNTER — Other Ambulatory Visit: Payer: Self-pay

## 2017-11-09 NOTE — Progress Notes (Signed)
Anesthesia Chart Review: SAME DAY WORKUP   Case:  [id number] Date/Time:  11/10/17 0715   Procedures:      ABDOMINAL AORTIC ENDOVASCULAR STENT GRAFT (N/A )     COILING EMBOLIZATION OF LEFT HYPOGASTRIC (Left )   Anesthesia type:  General   Pre-op diagnosis:  ABDOMINAL AORTIC ANEURYSM   Location:  [facility] OR ROOM 11 / [place] OR   Surgeon:  Serafina Mitchell, MD      DISCUSSION: 72 yo female former smoker for above procedure. Pertinent hx includes HTN, GERD, AAA, Asthma, Breast CA bilateral (s/p mastectomy on right and lumpectomy on left).  Pt recently had cardiac workup at [facility] for SOB and chest discomfort. Per cardiology note 03/25/2017 the pt had echo 02/10/2017 showing "severe asymmetric LVH; otherwise normal function. No LVOT outflow obstruction". She then had cardiac MRI 04/18/2017 showing EF 57%, normal LV size, basal septum measured 13 mm thick, preserved global systolic function, no wall motion abnormalities, no evidence of myocardial fibrosis. Subsequently she had a Nuclear perfusion scan 05/02/2017 showing EF 72%, fixed apical perfusion defect without wall motion abnormality, overall Low Risk scan.  Anticipate she can proceed as planned barring acute status change.  VS: There were no vitals taken for this visit.  PROVIDERS: Azell Der, MD is PCP  Cherlynn Polo, MD is Cardiologist last seen 03/25/2017  LABS: Will need same day labs  Labs Reviewed - No data to display   IMAGES: TECHNIQUE: Chest 2 views 12/16/2016 (care everywhere):  FINDINGS: Normal heart size. The lungs and pleural spaces are clear. Thoracic spondylosis. Cholecystectomy clips.  EKG: 05/26/2016: NSR 84bpm   CV: Myocardial perfusion scan 05/02/2017 (care everywhere): IMPRESSION: Abnormal cardiac perfusion exam for a moderate size fixed apical perfusion defect that could represent a previous apical infarct but there is no wall motion abnormality on left ventriculogram so it could be artifact. Prognostically this is a   low risk scan  TECHNIQUE: After the perfusion exam, gated images the left ventricle were analyzed by computer. Left ventricular ejection fraction was measured.  FINDINGS: Left ventricular ejection fraction is calculated to be 72 %.  IMPRESSION: Left ventricular ejection fraction of 72%.  TECHNIQUE: Gated imaging of the left ventricle was performed after the perfusion exam.  FINDINGS: Dynamic imaging of the left ventricle demonstrates no wall motion abnormalities.  IMPRESSION: Normal left ventricular wall motion.  Cardiac MRI 04/18/2017 (care everywhere): INTERPRETATION 1. LV size is normal. Mild basal septal hypertrophy. The basal septum   measures 13 mm in thickness. Global systolic function is preserved. The   calculated LV EF is 57%. Normal LV mass index. There are no regional  wall motion abnormalities. There is no delayed gadolinium enhancement   on the LV myocardium.   2. There is mitral regurgitation present without evidence of systolic   anterior motion of the mitral valve leaflet or chordae.   3. RV is normal in size and function  4. All visualized valves are pliable  5. There is no pericardial effusion   Conclusion: Normal LV size and function with mild basal septal  hypertrophy, likely normal variant given age and hypertension. No   evidence of myocardial fibrosis.  48hr Holter 03/31/2017 (care everywhere): Holter Monitor Interpretation:  Length of time:48 hours  Heart rate range:61-108 with mean of 73bpm  Diary: Not submitted   Ventricular  arrhythmia:Occasional PVC(*1.6%)  Atrial Arrhythmia:None  Pauses:None  Summary: -No significant arrhythmia detected. -Premature ventricular contraction noted - 1.6% burden.   Past Medical History:  Diagnosis  Date  . AAA (abdominal aortic aneurysm) ([place])   . Allergy   . Arthritis   . Asthma 2003  . Breast cancer ([place])    both breasts/lt. mastectomy  . Cancer ([place]) 2006   both breasts/cannot use rt. side  . Cataract    both eyes  . GERD (gastroesophageal reflux disease) 2003  . Heart murmur   . Hyperlipidemia    pt. stopped taking her medication for this /will attempt change in diet  . Hypertension 1978  . Migraine headache 2008    Past Surgical History:  Procedure Laterality Date  . ABDOMINAL HYSTERECTOMY  1989  . BREAST LUMPECTOMY Left   . BREAST SURGERY    . CHOLECYSTECTOMY  1985  . COLONOSCOPY    . lipoma removed      back  . MASTECTOMY Right    malignant  . POLYPECTOMY    . ROTATOR CUFF REPAIR  2007   lt. shoulder    MEDICATIONS: No current facility-administered medications for this encounter.    Marland Kitchen azelastine (ASTELIN) 0.1 % nasal spray  . dexlansoprazole (DEXILANT) 60 MG capsule  . diltiazem (CARDIZEM CD) 360 MG 24 hr capsule  . latanoprost (XALATAN) 0.005 % ophthalmic solution  . levocetirizine (XYZAL) 5 MG tablet  . losartan (COZAAR) 25 MG tablet  . potassium chloride SA (K-DUR,KLOR-CON) 20 MEQ tablet  . vitamin C (ASCORBIC ACID) 500 MG tablet    Wynonia Musty [facility] Short Stay Center/Anesthesiology Phone [phone] 11/09/2017 1:30 PM

## 2017-11-09 NOTE — Progress Notes (Signed)
PT denies SOB and chest pain. Pt stated that she is under the care of Dr. Cindee Salt, Cardiology. Pt denies having a cardiac cath. Pt denies recent labs. Pt made aware to stop taking vitamins, fish oil, and herbal medications. Do not take any NSAIDs ie: Ibuprofen, Advil, Naproxen (Aleve), Motrin, BC and Goody Powder. Pt verbalized understanding of all pre-op instructions. Anesthesia asked to review pt history; see note.

## 2017-11-10 ENCOUNTER — Inpatient Hospital Stay (HOSPITAL_COMMUNITY): Payer: Medicare HMO | Admitting: Physician Assistant

## 2017-11-10 ENCOUNTER — Inpatient Hospital Stay (HOSPITAL_COMMUNITY)
Admission: RE | Admit: 2017-11-10 | Discharge: 2017-11-11 | DRG: 269 | Disposition: A | Discharge status: Home / Self Care | Payer: Medicare HMO | Attending: Surgery | Admitting: Surgery

## 2017-11-10 ENCOUNTER — Encounter (HOSPITAL_COMMUNITY): Payer: Self-pay

## 2017-11-10 ENCOUNTER — Inpatient Hospital Stay (HOSPITAL_COMMUNITY): Payer: Medicare HMO

## 2017-11-10 ENCOUNTER — Encounter (HOSPITAL_COMMUNITY)
Admission: RE | Disposition: A | Discharge status: Home / Self Care | Payer: Self-pay | Source: Home / Self Care | Attending: Surgery

## 2017-11-10 DIAGNOSIS — Z823 Family history of stroke: Secondary | ICD-10-CM

## 2017-11-10 DIAGNOSIS — Z95828 Presence of other vascular implants and grafts: Secondary | ICD-10-CM

## 2017-11-10 DIAGNOSIS — Z419 Encounter for procedure for purposes other than remedying health state, unspecified: Secondary | ICD-10-CM

## 2017-11-10 DIAGNOSIS — J45909 Unspecified asthma, uncomplicated: Secondary | ICD-10-CM | POA: Diagnosis present

## 2017-11-10 DIAGNOSIS — Z888 Allergy status to other drugs, medicaments and biological substances status: Secondary | ICD-10-CM | POA: Diagnosis not present

## 2017-11-10 DIAGNOSIS — Z853 Personal history of malignant neoplasm of breast: Secondary | ICD-10-CM

## 2017-11-10 DIAGNOSIS — Z8679 Personal history of other diseases of the circulatory system: Secondary | ICD-10-CM

## 2017-11-10 DIAGNOSIS — Z9010 Acquired absence of unspecified breast and nipple: Secondary | ICD-10-CM

## 2017-11-10 DIAGNOSIS — Z8249 Family history of ischemic heart disease and other diseases of the circulatory system: Secondary | ICD-10-CM

## 2017-11-10 DIAGNOSIS — I714 Abdominal aortic aneurysm, without rupture, unspecified: Secondary | ICD-10-CM | POA: Diagnosis present

## 2017-11-10 DIAGNOSIS — Z882 Allergy status to sulfonamides status: Secondary | ICD-10-CM

## 2017-11-10 DIAGNOSIS — Z91041 Radiographic dye allergy status: Secondary | ICD-10-CM

## 2017-11-10 DIAGNOSIS — I10 Essential (primary) hypertension: Secondary | ICD-10-CM | POA: Diagnosis present

## 2017-11-10 DIAGNOSIS — Z841 Family history of disorders of kidney and ureter: Secondary | ICD-10-CM

## 2017-11-10 DIAGNOSIS — I340 Nonrheumatic mitral (valve) insufficiency: Secondary | ICD-10-CM | POA: Diagnosis present

## 2017-11-10 DIAGNOSIS — M4312 Spondylolisthesis, cervical region: Secondary | ICD-10-CM | POA: Diagnosis present

## 2017-11-10 DIAGNOSIS — I728 Aneurysm of other specified arteries: Secondary | ICD-10-CM

## 2017-11-10 DIAGNOSIS — Z87891 Personal history of nicotine dependence: Secondary | ICD-10-CM | POA: Diagnosis not present

## 2017-11-10 DIAGNOSIS — I723 Aneurysm of iliac artery: Secondary | ICD-10-CM | POA: Diagnosis present

## 2017-11-10 DIAGNOSIS — E7800 Pure hypercholesterolemia, unspecified: Secondary | ICD-10-CM | POA: Diagnosis present

## 2017-11-10 DIAGNOSIS — I493 Ventricular premature depolarization: Secondary | ICD-10-CM | POA: Diagnosis present

## 2017-11-10 HISTORY — PX: ABDOMINAL AORTIC ENDOVASCULAR STENT GRAFT: SHX5707

## 2017-11-10 HISTORY — PX: EMBOLIZATION: SHX5507

## 2017-11-10 LAB — COMPREHENSIVE METABOLIC PANEL
ALK PHOS: 60 U/L (ref 38–126)
ALT: 11 U/L (ref 0–44)
AST: 15 U/L (ref 15–41)
Albumin: 3.5 g/dL (ref 3.5–5.0)
Anion gap: 4 — ABNORMAL LOW (ref 5–15)
BUN: 19 mg/dL (ref 8–23)
CHLORIDE: 107 mmol/L (ref 98–111)
CO2: 31 mmol/L (ref 22–32)
Calcium: 8.7 mg/dL — ABNORMAL LOW (ref 8.9–10.3)
Creatinine, Ser: 0.97 mg/dL (ref 0.44–1.00)
GFR, EST NON AFRICAN AMERICAN: 57 mL/min — AB (ref >60)
Glucose, Bld: 99 mg/dL (ref 70–99)
Potassium: 3.7 mmol/L (ref 3.5–5.1)
Sodium: 142 mmol/L (ref 135–145)
Total Bilirubin: 0.4 mg/dL (ref 0.3–1.2)
Total Protein: 6.6 g/dL (ref 6.5–8.1)

## 2017-11-10 LAB — BASIC METABOLIC PANEL
ANION GAP: 9 (ref 5–15)
BUN: 14 mg/dL (ref 8–23)
CALCIUM: 8.5 mg/dL — AB (ref 8.9–10.3)
CO2: 26 mmol/L (ref 22–32)
Chloride: 107 mmol/L (ref 98–111)
Creatinine, Ser: 0.77 mg/dL (ref 0.44–1.00)
GFR calc non Af Amer: >60 mL/min (ref >60)
GLUCOSE: 157 mg/dL — AB (ref 70–99)
POTASSIUM: 3.5 mmol/L (ref 3.5–5.1)
Sodium: 142 mmol/L (ref 135–145)

## 2017-11-10 LAB — CBC
HCT: 38.1 % (ref 36.0–46.0)
HEMATOCRIT: 37.6 % (ref 36.0–46.0)
HEMOGLOBIN: 11.9 g/dL — AB (ref 12.0–15.0)
HEMOGLOBIN: 12.1 g/dL (ref 12.0–15.0)
MCH: 25.5 pg — AB (ref 26.0–34.0)
MCH: 25.7 pg — ABNORMAL LOW (ref 26.0–34.0)
MCHC: 31.2 g/dL (ref 30.0–36.0)
MCHC: 32.2 g/dL (ref 30.0–36.0)
MCV: 81.8 fL (ref 78.0–100.0)
MCV: 79.8 fL (ref 78.0–100.0)
PLATELETS: 197 10*3/uL (ref 150–400)
Platelets: 191 10*3/uL (ref 150–400)
RBC: 4.66 MIL/uL (ref 3.87–5.11)
RBC: 4.71 MIL/uL (ref 3.87–5.11)
RDW: 14.6 % (ref 11.5–15.5)
RDW: 14.4 % (ref 11.5–15.5)
WBC: 7.7 10*3/uL (ref 4.0–10.5)
WBC: 8.6 10*3/uL (ref 4.0–10.5)

## 2017-11-10 LAB — PROTIME-INR
INR: 1.01
INR: 1.11
PROTHROMBIN TIME: 13.2 s (ref 11.4–15.2)
Prothrombin Time: 14.2 seconds (ref 11.4–15.2)

## 2017-11-10 LAB — URINALYSIS, ROUTINE W REFLEX MICROSCOPIC
Bilirubin Urine: NEGATIVE
GLUCOSE, UA: NEGATIVE mg/dL
Hgb urine dipstick: NEGATIVE
Ketones, ur: NEGATIVE mg/dL
Leukocytes, UA: NEGATIVE
Nitrite: NEGATIVE
PROTEIN: NEGATIVE mg/dL
Specific Gravity, Urine: 1.015 (ref 1.005–1.030)
pH: 6 (ref 5.0–8.0)

## 2017-11-10 LAB — SURGICAL PCR SCREEN
MRSA, PCR: NEGATIVE
STAPHYLOCOCCUS AUREUS: NEGATIVE

## 2017-11-10 LAB — ABO/RH: ABO/RH(D): O POS

## 2017-11-10 LAB — TYPE AND SCREEN
ABO/RH(D): O POS
Antibody Screen: NEGATIVE

## 2017-11-10 LAB — POCT ACTIVATED CLOTTING TIME
ACTIVATED CLOTTING TIME: 219 s
Activated Clotting Time: 224 seconds

## 2017-11-10 LAB — APTT
APTT: 40 s — AB (ref 24–36)
aPTT: 33 seconds (ref 24–36)

## 2017-11-10 LAB — MAGNESIUM: MAGNESIUM: 1.9 mg/dL (ref 1.7–2.4)

## 2017-11-10 SURGERY — INSERTION, ENDOVASCULAR STENT GRAFT, AORTA, ABDOMINAL
Anesthesia: General | Site: Abdomen

## 2017-11-10 MED ORDER — FENTANYL CITRATE (PF) 250 MCG/5ML IJ SOLN
INTRAMUSCULAR | Status: AC
  Filled 2017-11-10: qty 5

## 2017-11-10 MED ORDER — ACETAMINOPHEN 10 MG/ML IV SOLN
INTRAVENOUS | Status: DC | PRN
  Administered 2017-11-10: 1000 mg via INTRAVENOUS

## 2017-11-10 MED ORDER — OXYCODONE-ACETAMINOPHEN 5-325 MG PO TABS: 1.0000 | ORAL_TABLET | ORAL | Status: DC | PRN

## 2017-11-10 MED ORDER — LORATADINE 10 MG PO TABS
10.0000 mg | ORAL_TABLET | Freq: Every evening | ORAL | Status: DC
  Filled 2017-11-10: qty 1

## 2017-11-10 MED ORDER — HYDRALAZINE HCL 20 MG/ML IJ SOLN: 5.0000 mg | INTRAMUSCULAR | Status: DC | PRN

## 2017-11-10 MED ORDER — FENTANYL CITRATE (PF) 100 MCG/2ML IJ SOLN
25.0000 ug | INTRAMUSCULAR | Status: DC | PRN
  Administered 2017-11-10: 25 ug via INTRAVENOUS

## 2017-11-10 MED ORDER — PHENYLEPHRINE 40 MCG/ML (10ML) SYRINGE FOR IV PUSH (FOR BLOOD PRESSURE SUPPORT)
PREFILLED_SYRINGE | INTRAVENOUS | Status: AC
  Filled 2017-11-10: qty 10

## 2017-11-10 MED ORDER — LABETALOL HCL 5 MG/ML IV SOLN: 10.0000 mg | INTRAVENOUS | Status: DC | PRN

## 2017-11-10 MED ORDER — VITAMIN C 500 MG PO TABS
500.0000 mg | ORAL_TABLET | Freq: Every day | ORAL | Status: DC
  Administered 2017-11-10: 500 mg via ORAL
  Filled 2017-11-10: qty 1

## 2017-11-10 MED ORDER — PROTAMINE SULFATE 10 MG/ML IV SOLN
INTRAVENOUS | Status: DC | PRN
  Administered 2017-11-10: 10 mg via INTRAVENOUS
  Administered 2017-11-10: 15 mg via INTRAVENOUS

## 2017-11-10 MED ORDER — POLYETHYLENE GLYCOL 3350 17 G PO PACK: 17.0000 g | PACK | Freq: Every day | ORAL | Status: DC | PRN

## 2017-11-10 MED ORDER — DOCUSATE SODIUM 100 MG PO CAPS: 100.0000 mg | ORAL_CAPSULE | Freq: Every day | ORAL | Status: DC

## 2017-11-10 MED ORDER — ACETAMINOPHEN 325 MG RE SUPP: 325.0000 mg | RECTAL | Status: DC | PRN

## 2017-11-10 MED ORDER — SODIUM CHLORIDE 0.9 % IV SOLN
INTRAVENOUS | Status: AC
  Filled 2017-11-10: qty 1.2

## 2017-11-10 MED ORDER — MAGNESIUM SULFATE 2 GM/50ML IV SOLN: 2.0000 g | Freq: Every day | INTRAVENOUS | Status: DC | PRN

## 2017-11-10 MED ORDER — PHENOL 1.4 % MT LIQD: 1.0000 | OROMUCOSAL | Status: DC | PRN

## 2017-11-10 MED ORDER — IODIXANOL 320 MG/ML IV SOLN
INTRAVENOUS | Status: DC | PRN
  Administered 2017-11-10: 97.79 mL via INTRAVENOUS

## 2017-11-10 MED ORDER — HEPARIN SODIUM (PORCINE) 5000 UNIT/ML IJ SOLN: 5000.0000 [IU] | Freq: Three times a day (TID) | INTRAMUSCULAR | Status: DC

## 2017-11-10 MED ORDER — MORPHINE SULFATE (PF) 2 MG/ML IV SOLN
2.0000 mg | INTRAVENOUS | Status: DC | PRN
  Filled 2017-11-10: qty 1

## 2017-11-10 MED ORDER — ROCURONIUM BROMIDE 50 MG/5ML IV SOSY
PREFILLED_SYRINGE | INTRAVENOUS | Status: DC | PRN
  Administered 2017-11-10: 20 mg via INTRAVENOUS
  Administered 2017-11-10: 50 mg via INTRAVENOUS
  Administered 2017-11-10: 20 mg via INTRAVENOUS

## 2017-11-10 MED ORDER — ACETAMINOPHEN-CODEINE #3 300-30 MG PO TABS
1.0000 | ORAL_TABLET | ORAL | Status: DC | PRN
  Administered 2017-11-10 – 2017-11-11 (×2): 1 via ORAL
  Filled 2017-11-10 (×2): qty 1

## 2017-11-10 MED ORDER — FENTANYL CITRATE (PF) 100 MCG/2ML IJ SOLN
INTRAMUSCULAR | Status: DC | PRN
  Administered 2017-11-10 (×3): 50 ug via INTRAVENOUS

## 2017-11-10 MED ORDER — ACETAMINOPHEN 10 MG/ML IV SOLN
INTRAVENOUS | Status: AC
  Filled 2017-11-10: qty 100

## 2017-11-10 MED ORDER — SODIUM CHLORIDE 0.9 % IV SOLN
INTRAVENOUS | Status: DC | PRN
  Administered 2017-11-10: 09:00:00

## 2017-11-10 MED ORDER — BISACODYL 10 MG RE SUPP: 10.0000 mg | Freq: Every day | RECTAL | Status: DC | PRN

## 2017-11-10 MED ORDER — CHLORHEXIDINE GLUCONATE 4 % EX LIQD: 60.0000 mL | Freq: Once | CUTANEOUS | Status: DC

## 2017-11-10 MED ORDER — LATANOPROST 0.005 % OP SOLN
1.0000 [drp] | Freq: Every day | OPHTHALMIC | Status: DC
  Administered 2017-11-10: 1 [drp] via OPHTHALMIC
  Filled 2017-11-10: qty 2.5

## 2017-11-10 MED ORDER — FENTANYL CITRATE (PF) 100 MCG/2ML IJ SOLN
INTRAMUSCULAR | Status: AC
  Filled 2017-11-10: qty 2

## 2017-11-10 MED ORDER — SODIUM CHLORIDE 0.9 % IV SOLN: 500.0000 mL | Freq: Once | INTRAVENOUS | Status: DC | PRN

## 2017-11-10 MED ORDER — LIDOCAINE 2% (20 MG/ML) 5 ML SYRINGE
INTRAMUSCULAR | Status: AC
  Filled 2017-11-10: qty 5

## 2017-11-10 MED ORDER — METOPROLOL TARTRATE 5 MG/5ML IV SOLN: 2.0000 mg | INTRAVENOUS | Status: DC | PRN

## 2017-11-10 MED ORDER — POTASSIUM CHLORIDE CRYS ER 20 MEQ PO TBCR: 20.0000 meq | EXTENDED_RELEASE_TABLET | Freq: Every day | ORAL | Status: DC | PRN

## 2017-11-10 MED ORDER — HEPARIN SODIUM (PORCINE) 1000 UNIT/ML IJ SOLN
INTRAMUSCULAR | Status: AC
  Filled 2017-11-10: qty 1

## 2017-11-10 MED ORDER — CEFAZOLIN SODIUM-DEXTROSE 2-4 GM/100ML-% IV SOLN
INTRAVENOUS | Status: AC
  Filled 2017-11-10: qty 100

## 2017-11-10 MED ORDER — ESMOLOL HCL 100 MG/10ML IV SOLN
INTRAVENOUS | Status: AC
  Filled 2017-11-10: qty 10

## 2017-11-10 MED ORDER — SODIUM CHLORIDE 0.9 % IV SOLN
INTRAVENOUS | Status: DC | PRN
  Administered 2017-11-10: 20 ug/min via INTRAVENOUS

## 2017-11-10 MED ORDER — HEPARIN SODIUM (PORCINE) 1000 UNIT/ML IJ SOLN
INTRAMUSCULAR | Status: DC | PRN
  Administered 2017-11-10: 1000 [IU] via INTRAVENOUS
  Administered 2017-11-10: 7000 [IU] via INTRAVENOUS

## 2017-11-10 MED ORDER — LACTATED RINGERS IV SOLN: INTRAVENOUS | Status: DC

## 2017-11-10 MED ORDER — GUAIFENESIN-DM 100-10 MG/5ML PO SYRP: 15.0000 mL | ORAL_SOLUTION | ORAL | Status: DC | PRN

## 2017-11-10 MED ORDER — CEFAZOLIN SODIUM-DEXTROSE 2-4 GM/100ML-% IV SOLN
2.0000 g | Freq: Three times a day (TID) | INTRAVENOUS | Status: AC
  Administered 2017-11-10 (×2): 2 g via INTRAVENOUS
  Filled 2017-11-10: qty 100

## 2017-11-10 MED ORDER — ALUM & MAG HYDROXIDE-SIMETH 200-200-20 MG/5ML PO SUSP: 15.0000 mL | ORAL | Status: DC | PRN

## 2017-11-10 MED ORDER — PROPOFOL 10 MG/ML IV BOLUS
INTRAVENOUS | Status: DC | PRN
  Administered 2017-11-10: 80 mg via INTRAVENOUS

## 2017-11-10 MED ORDER — SODIUM CHLORIDE 0.9 % IV SOLN: INTRAVENOUS | Status: DC

## 2017-11-10 MED ORDER — LACTATED RINGERS IV SOLN
INTRAVENOUS | Status: DC | PRN
  Administered 2017-11-10: 07:00:00 via INTRAVENOUS

## 2017-11-10 MED ORDER — DIPHENHYDRAMINE HCL 50 MG/ML IJ SOLN
INTRAMUSCULAR | Status: AC
  Filled 2017-11-10: qty 1

## 2017-11-10 MED ORDER — AZELASTINE HCL 0.1 % NA SOLN: 1.0000 | Freq: Two times a day (BID) | NASAL | Status: DC | PRN

## 2017-11-10 MED ORDER — MIDAZOLAM HCL 5 MG/5ML IJ SOLN
INTRAMUSCULAR | Status: DC | PRN
  Administered 2017-11-10 (×2): 1 mg via INTRAVENOUS

## 2017-11-10 MED ORDER — DEXAMETHASONE SODIUM PHOSPHATE 10 MG/ML IJ SOLN
INTRAMUSCULAR | Status: AC
  Filled 2017-11-10: qty 1

## 2017-11-10 MED ORDER — ESMOLOL HCL 100 MG/10ML IV SOLN
INTRAVENOUS | Status: DC | PRN
  Administered 2017-11-10: 20 mg via INTRAVENOUS
  Administered 2017-11-10: 30 mg via INTRAVENOUS

## 2017-11-10 MED ORDER — DEXAMETHASONE SODIUM PHOSPHATE 10 MG/ML IJ SOLN
INTRAMUSCULAR | Status: DC | PRN
  Administered 2017-11-10: 10 mg via INTRAVENOUS

## 2017-11-10 MED ORDER — CEFAZOLIN SODIUM-DEXTROSE 2-4 GM/100ML-% IV SOLN
2.0000 g | INTRAVENOUS | Status: AC
  Administered 2017-11-10: 2 g via INTRAVENOUS
  Filled 2017-11-10: qty 100

## 2017-11-10 MED ORDER — SUGAMMADEX SODIUM 200 MG/2ML IV SOLN
INTRAVENOUS | Status: DC | PRN
  Administered 2017-11-10: 200 mg via INTRAVENOUS

## 2017-11-10 MED ORDER — ACETAMINOPHEN 325 MG PO TABS: 325.0000 mg | ORAL_TABLET | ORAL | Status: DC | PRN

## 2017-11-10 MED ORDER — ONDANSETRON HCL 4 MG/2ML IJ SOLN: 4.0000 mg | Freq: Four times a day (QID) | INTRAMUSCULAR | Status: DC | PRN

## 2017-11-10 MED ORDER — ONDANSETRON HCL 4 MG/2ML IJ SOLN
INTRAMUSCULAR | Status: DC | PRN
  Administered 2017-11-10: 4 mg via INTRAVENOUS

## 2017-11-10 MED ORDER — PANTOPRAZOLE SODIUM 40 MG PO TBEC: 40.0000 mg | DELAYED_RELEASE_TABLET | Freq: Every day | ORAL | Status: DC

## 2017-11-10 MED ORDER — ONDANSETRON HCL 4 MG/2ML IJ SOLN
INTRAMUSCULAR | Status: AC
  Filled 2017-11-10: qty 2

## 2017-11-10 MED ORDER — PROTAMINE SULFATE 10 MG/ML IV SOLN
INTRAVENOUS | Status: AC
  Filled 2017-11-10: qty 5

## 2017-11-10 MED ORDER — LEVOCETIRIZINE DIHYDROCHLORIDE 5 MG PO TABS: 5.0000 mg | ORAL_TABLET | Freq: Every evening | ORAL | Status: DC

## 2017-11-10 MED ORDER — LIDOCAINE 2% (20 MG/ML) 5 ML SYRINGE
INTRAMUSCULAR | Status: DC | PRN
  Administered 2017-11-10: 60 mg via INTRAVENOUS

## 2017-11-10 MED ORDER — DIPHENHYDRAMINE HCL 50 MG/ML IJ SOLN
INTRAMUSCULAR | Status: DC | PRN
  Administered 2017-11-10: 12.5 mg via INTRAVENOUS

## 2017-11-10 MED ORDER — ROCURONIUM BROMIDE 50 MG/5ML IV SOSY
PREFILLED_SYRINGE | INTRAVENOUS | Status: AC
  Filled 2017-11-10: qty 5

## 2017-11-10 MED ORDER — DILTIAZEM HCL ER COATED BEADS 180 MG PO CP24
360.0000 mg | ORAL_CAPSULE | Freq: Every day | ORAL | Status: DC
  Administered 2017-11-11: 360 mg via ORAL
  Filled 2017-11-10: qty 2

## 2017-11-10 MED ORDER — 0.9 % SODIUM CHLORIDE (POUR BTL) OPTIME
TOPICAL | Status: DC | PRN
  Administered 2017-11-10: 1000 mL

## 2017-11-10 MED ORDER — MIDAZOLAM HCL 2 MG/2ML IJ SOLN
INTRAMUSCULAR | Status: AC
  Filled 2017-11-10: qty 2

## 2017-11-10 MED ORDER — PROPOFOL 10 MG/ML IV BOLUS
INTRAVENOUS | Status: AC
  Filled 2017-11-10: qty 20

## 2017-11-10 SURGICAL SUPPLY — 84 items
BLADE CLIPPER SURG (BLADE) ×4 IMPLANT
CANISTER SUCT 3000ML PPV (MISCELLANEOUS) ×4 IMPLANT
CATH ANGIO 5F BER2 100CM (CATHETERS) ×8 IMPLANT
CATH ANGIO 5F BER2 65CM (CATHETERS) IMPLANT
CATH BEACON 5 .035 65 KMP TIP (CATHETERS) ×4 IMPLANT
CATH BEACON 5 .035 65 VANSC3 (CATHETERS) ×4 IMPLANT
CATH BEACON 5.038 65CM KMP-01 (CATHETERS) IMPLANT
CATH DIREXION BERN TIP 155CM (CATHETERS) ×4 IMPLANT
CATH OMNI FLUSH .035X70CM (CATHETERS) ×4 IMPLANT
CATH QUICKCROSS .035X135CM (MICROCATHETER) ×4 IMPLANT
CATH SOFT-VU ST 4F 90CM (CATHETERS) ×4 IMPLANT
COIL IDC 2D .035 12MMX20CM (Embolic) ×4 IMPLANT
COIL IDC 360 .035 10MMX40CM (Embolic) ×4 IMPLANT
COIL IDC 360 .035 15MMX40CM (Embolic) ×4 IMPLANT
COVER PROBE W GEL 5X96 (DRAPES) ×4 IMPLANT
DERMABOND ADVANCED (GAUZE/BANDAGES/DRESSINGS) ×4
DERMABOND ADVANCED .7 DNX12 (GAUZE/BANDAGES/DRESSINGS) ×4 IMPLANT
DEVICE CLOSURE PERCLS PRGLD 6F (VASCULAR PRODUCTS) ×8 IMPLANT
DEVICE TORQUE H2O (MISCELLANEOUS) ×4 IMPLANT
DRAPE C-ARM 42X72 X-RAY (DRAPES) ×4 IMPLANT
DRSG TEGADERM 2-3/8X2-3/4 SM (GAUZE/BANDAGES/DRESSINGS) ×8 IMPLANT
DRYSEAL FLEXSHEATH 14FR 33CM (SHEATH) ×2
DRYSEAL FLEXSHEATH 16FR 33CM (SHEATH) ×2
ELECT CAUTERY BLADE 6.4 (BLADE) ×4 IMPLANT
ELECT REM PT RETURN 9FT ADLT (ELECTROSURGICAL) ×8
ELECTRODE REM PT RTRN 9FT ADLT (ELECTROSURGICAL) ×4 IMPLANT
EXCLUDER TNK LEG 23MX14X18 (Endovascular Graft) ×2 IMPLANT
EXCLUDER TRUNK LEG 23MX14X18 (Endovascular Graft) ×4 IMPLANT
FILTER CO2 0.2 MICRON (VASCULAR PRODUCTS) IMPLANT
FILTER CO2 INSUFFLATOR AX1008 (MISCELLANEOUS) IMPLANT
GAUZE SPONGE 2X2 8PLY STRL LF (GAUZE/BANDAGES/DRESSINGS) ×4 IMPLANT
GLOVE BIOGEL PI IND STRL 6.5 (GLOVE) ×8 IMPLANT
GLOVE BIOGEL PI IND STRL 7.5 (GLOVE) ×2 IMPLANT
GLOVE BIOGEL PI INDICATOR 6.5 (GLOVE) ×8
GLOVE BIOGEL PI INDICATOR 7.5 (GLOVE) ×2
GLOVE SURG SS PI 6.5 STRL IVOR (GLOVE) ×4 IMPLANT
GLOVE SURG SS PI 7.5 STRL IVOR (GLOVE) ×4 IMPLANT
GOWN STRL REUS W/ TWL LRG LVL3 (GOWN DISPOSABLE) ×6 IMPLANT
GOWN STRL REUS W/ TWL XL LVL3 (GOWN DISPOSABLE) ×2 IMPLANT
GOWN STRL REUS W/TWL LRG LVL3 (GOWN DISPOSABLE) ×6
GOWN STRL REUS W/TWL XL LVL3 (GOWN DISPOSABLE) ×2
GRAFT BALLN CATH 65CM (STENTS) ×2 IMPLANT
GUIDEWIRE ANGLED .035X150CM (WIRE) ×4 IMPLANT
GUIDEWIRE FATHOM 016-180CM (WIRE) ×4 IMPLANT
HEMOSTAT SNOW SURGICEL 2X4 (HEMOSTASIS) IMPLANT
KIT BASIN OR (CUSTOM PROCEDURE TRAY) ×4 IMPLANT
KIT ENCORE 26 ADVANTAGE (KITS) IMPLANT
KIT TURNOVER KIT B (KITS) ×4 IMPLANT
LEG CONTRALATERAL 23X10 (Vascular Products) ×4 IMPLANT
LEG CONTRALATERAL 23X12 (Endovascular Graft) ×4 IMPLANT
NEEDLE PERC 18GX7CM (NEEDLE) ×12 IMPLANT
NS IRRIG 1000ML POUR BTL (IV SOLUTION) ×4 IMPLANT
PACK ENDOVASCULAR (PACKS) ×4 IMPLANT
PAD ARMBOARD 7.5X6 YLW CONV (MISCELLANEOUS) ×8 IMPLANT
PENCIL BUTTON HOLSTER BLD 10FT (ELECTRODE) ×4 IMPLANT
PERCLOSE PROGLIDE 6F (VASCULAR PRODUCTS) ×16
SET FLUSH CO2 (MISCELLANEOUS) IMPLANT
SHEATH AVANTI 11CM 8FR (SHEATH) IMPLANT
SHEATH BRITE TIP 8FR 23CM (SHEATH) ×4 IMPLANT
SHEATH DRYSEAL FLEX 14FR 33CM (SHEATH) ×2 IMPLANT
SHEATH DRYSEAL FLEX 16FR 33CM (SHEATH) ×2 IMPLANT
SHEATH FLEXOR ANSEL 1 7F 45CM (SHEATH) ×4 IMPLANT
SHEATH GUIDING 8.5FR 77X17 (SHEATH) ×4 IMPLANT
SHEATH PINNACLE 8F 10CM (SHEATH) ×4 IMPLANT
SHIELD RADPAD SCOOP 12X17 (MISCELLANEOUS) ×12 IMPLANT
SPONGE GAUZE 2X2 STER 10/PKG (GAUZE/BANDAGES/DRESSINGS) ×4
STENT GRAFT BALLN CATH 65CM (STENTS) ×2
STOPCOCK MORSE 400PSI 3WAY (MISCELLANEOUS) ×8 IMPLANT
SUT ETHILON 3 0 PS 1 (SUTURE) IMPLANT
SUT PROLENE 5 0 C 1 24 (SUTURE) IMPLANT
SUT VIC AB 2-0 CT1 27 (SUTURE)
SUT VIC AB 2-0 CT1 TAPERPNT 27 (SUTURE) IMPLANT
SUT VIC AB 3-0 SH 27 (SUTURE)
SUT VIC AB 3-0 SH 27X BRD (SUTURE) IMPLANT
SUT VICRYL 4-0 PS2 18IN ABS (SUTURE) IMPLANT
SYR 30ML LL (SYRINGE) ×4 IMPLANT
TOWEL GREEN STERILE (TOWEL DISPOSABLE) ×4 IMPLANT
TRAY FOLEY MTR SLVR 16FR STAT (SET/KITS/TRAYS/PACK) ×4 IMPLANT
TUBE CONNECTING 20'X1/4 (TUBING) ×1
TUBE CONNECTING 20X1/4 (TUBING) ×3 IMPLANT
TUBING HIGH PRESSURE 120CM (CONNECTOR) ×8 IMPLANT
WIRE AMPLATZ SS-J .035X180CM (WIRE) ×8 IMPLANT
WIRE BENTSON .035X145CM (WIRE) ×8 IMPLANT
WIRE HI TORQ VERSACORE J 260CM (WIRE) ×12 IMPLANT

## 2017-11-10 NOTE — Interval H&P Note (Signed)
History and Physical Interval Note:  11/10/2017 7:25 AM  Kathryn [name]  has presented today for surgery, with the diagnosis of ABDOMINAL AORTIC ANEURYSM  The various methods of treatment have been discussed with the patient and family. After consideration of risks, benefits and other options for treatment, the patient has consented to  Procedure(s): ABDOMINAL AORTIC ENDOVASCULAR STENT GRAFT (N/A) COILING EMBOLIZATION OF LEFT HYPOGASTRIC (Left) as a surgical intervention .  The patient's history has been reviewed, patient examined, no change in status, stable for surgery.  I have reviewed the patient's chart and labs.  Questions were answered to the patient's satisfaction.     [name]

## 2017-11-10 NOTE — Op Note (Signed)
Patient name: Kathryn [name] MRN: [id number] DOB: Jul 19, 1945 Sex: female  11/10/2017 Pre-operative Diagnosis: AAA Post-operative diagnosis:  Same Surgeon:  [name] Assistants:  Leontine Locket Procedure:   #1:  Endovascular repair of infrarenal abdominal aortic aneurysm   #2:  Coil embolization of left hypogastric aneurysm   #3:  2nd order catheterization (left hypogastric artery)   #3:  Pelvic angiogram   #4:  Abdominal aortogram   #5:  Distal extension x 1 Anesthesia:  General Blood Loss:  100 Specimens:  none  Findings:  Complete exclusion  Devices Used:  GORE 23x14x18, R 23x12, R23x10 Coils:  Interloclk:  12x20, 10x40, 15x40  Indications:  I have been following the patient for a AAA.  It is now 5cm.  When I saw her in the office 2 days ago, she was having pain over her AAA and so we elected to go ahead and repair it  Procedure:  The patient was identified in the holding area and taken to [place] 11  The patient was then placed supine on the table. general anesthesia was administered.  The patient was prepped and draped in the usual sterile fashion.  A time out was called and antibiotics were administered.  Ultrasound was used to evaluate bilateral common femoral arteries which were widely patent without significant calcification.  A skin nick was made bilaterally.  Bilateral common femoral arteries were then cannulated under ultrasound guidance with an 18-gauge needle.  A 035 Bentson wire was inserted into the aorta without difficulty.  The subcutaneous tracts were dilated with an 8 [place] dilator.  Probe glide devices were deployed at the 11:00 and 1 o'clock position for pre-closure.  I placed an 8 French sheath on the left and then I placed a 7 French 45 cm sheath on the right.  The patient was fully heparinized.  I then used a Omni Flush catheter from the right side to try to gain access into the left hypogastric artery.  I was unable to advance the catheter very far into the  hypogastric artery because it kept kicking me out.  Multiple wires and catheters were tried and ultimately I abandon attempts from the side.  I turned my attention towards the left side.  I remove the 8 French sheath and placed a 8 French 77 cm Oscore deflectable sheath.  Using this I was able to deflect the sheath into the origin of the hypogastric artery.  I then used multiple catheters and wires to try to get past the hypogastric aneurysm which was at the primary branch point.  I also tried to use a microcatheter.  Ultimately, with the use of a Berenstein 2 catheter and a Glidewire, I was able to select the anterior branch which was confirmed with a contrast injection with the catheter tip in the anterior branch.  I then set up for coil embolization of the hypogastric artery aneurysm.  I first deployed a 12 x 20 interlock coil which landed at the bifurcation.  I placed 2 additional coils, a 10 x 40 interlock and a 15 x 40 interlock.  Contrast imaging through the sheath revealed adequate embolization of the hypogastric artery at the level of the aneurysm  I then remove the deflectable sheath after getting a Amplatz superstiff wire into the descending thoracic aorta.  A 18 French sheath was then advanced into the aorta under fluoroscopic visualization.  I then placed an Amplatz superstiff wire up the right side and remove the 7 [place] sheath and  placed a 14 French sheath.  A pigtail catheter was then advanced up the right side and positioned at the level of L1.  The main body device was prepared on the back table.  This was a Gore 23 x 14 x 18 device.  An abdominal aortogram was performed locating the renal arteries.  The main body device was then deployed down to the contralateral gate.  An additional aortogram was performed which confirmed that the device was in good position.  I then cannulated the contralateral gate using a Berenstein 2 catheter and a Glidewire.  The Glidewire was removed and a Amplatz  superstiff wire was placed.  A pigtail catheter was then inserted after removing the Berenstein 2 catheter.  The pigtail was able to be freely rotated within the main body, confirming successful cannulation.  An Amplatz superstiff wire was then placed.  The image detector was rotated to a left anterior oblique position and a retrograde injection was performed to the sheath locating the right hypogastric artery.  Pigtail catheter was removed.  The dilator was inserted and the sheath was advanced into the contralateral gate.  The contralateral limb was then inserted.  This was a Gore 23 x 12 device.  It was then deployed however it did land shorter than anticipated.  Next, the remaining portion of the ipsilateral limb was deployed landing well and to the left external iliac artery.  Next, a MOB balloon was used to mold the proximal and distal attachment sites as well as device overlap.  A when I ballooned the right limb, I felt that this was landing too short into the proximal common iliac artery and therefore I felt that a distal extension was needed.  A Gore 23 x 10 device was then deployed landing at the level of the right hypogastric artery.  This was also molded with a balloon.  Next, a pigtail catheter was inserted and a completion arteriogram was performed which showed complete exclusion of the aneurysm with continued patency of bilateral renal arteries as well as the right hypogastric artery.  There was no flow into the left hypogastric artery.  I was satisfied with these results.  The stiff wires were exchanged out for Bentson wires.  The sheaths were removed and the arteriotomy site was closed by securing the pro-glide devices.  25 mg of protamine was given.  Manual pressure was held on the groin for approximately 5 minutes.  Both groins were hemostatic.  Dermabond was placed over the incisions.  The patient had brisk Doppler signals bilaterally.  She was successfully extubated and taken to recovery in  stable condition.  There were no immediate complications.   Disposition: To PACU stable   V. [name], M.D. [facility] Specialists of [place] Office: [phone] Pager:  [phone]

## 2017-11-10 NOTE — Transfer of Care (Signed)
Immediate Anesthesia Transfer of Care Note  Patient: Kathryn [name]  Procedure(s) Performed: ABDOMINAL AORTIC ENDOVASCULAR STENT GRAFT (N/A Abdomen) COILING EMBOLIZATION OF LEFT HYPOGASTRIC (Left Abdomen)  Patient Location: PACU  Anesthesia Type:General  Level of Consciousness: awake, alert  and oriented  Airway & Oxygen Therapy: Patient Spontanous Breathing and Patient connected to nasal cannula oxygen  Post-op Assessment: Report given to RN and Post -op Vital signs reviewed and stable  Post vital signs: Reviewed and stable  Last Vitals:  Vitals Value Taken Time  BP    Temp    Pulse 72 11/10/2017 10:54 AM  Resp 15 11/10/2017 10:55 AM  SpO2 98 % 11/10/2017 10:54 AM  Vitals shown include unvalidated device data.  Last Pain:  Vitals:   11/10/17 0626  TempSrc:   PainSc: 4          Complications: No apparent anesthesia complications

## 2017-11-10 NOTE — Anesthesia Procedure Notes (Signed)
Procedure Name: Intubation Date/Time: 11/10/2017 7:49 AM Performed by: Candis Shine, CRNA Pre-anesthesia Checklist: Patient identified, Emergency Drugs available, Suction available and Patient being monitored Patient Re-evaluated:Patient Re-evaluated prior to induction Oxygen Delivery Method: Circle System Utilized Preoxygenation: Pre-oxygenation with 100% oxygen Induction Type: IV induction Ventilation: Mask ventilation without difficulty Laryngoscope Size: Mac and 3 Grade View: Grade I Tube type: Oral Tube size: 7.0 mm Number of attempts: 1 Airway Equipment and Method: Stylet Placement Confirmation: ETT inserted through vocal cords under direct vision,  positive ETCO2 and breath sounds checked- equal and bilateral Secured at: 22 cm Tube secured with: Tape Dental Injury: Teeth and Oropharynx as per pre-operative assessment

## 2017-11-10 NOTE — Anesthesia Postprocedure Evaluation (Signed)
Anesthesia Post Note  Patient: Kathryn [name]  Procedure(s) Performed: ABDOMINAL AORTIC ENDOVASCULAR STENT GRAFT (N/A Abdomen) COILING EMBOLIZATION OF LEFT HYPOGASTRIC (Left Abdomen)     Patient location during evaluation: PACU Anesthesia Type: General Level of consciousness: awake and alert Pain management: pain level controlled Vital Signs Assessment: post-procedure vital signs reviewed and stable Respiratory status: spontaneous breathing, nonlabored ventilation, respiratory function stable and patient connected to nasal cannula oxygen Cardiovascular status: blood pressure returned to baseline and stable Postop Assessment: no apparent nausea or vomiting Anesthetic complications: no    Last Vitals:  Vitals:   11/10/17 1315 11/10/17 1324  BP:  134/72  Pulse: (!) 59 (!) 58  Resp: 12 (!) 22  Temp:    SpO2: 100% 100%    Last Pain:  Vitals:   11/10/17 1324  TempSrc:   PainSc: 2                  Effie Berkshire

## 2017-11-10 NOTE — Anesthesia Procedure Notes (Signed)
Arterial Line Insertion Start/End9/26/2019 7:00 AM Performed by: Candis Shine, CRNA, CRNA  Patient location: Pre-op. Preanesthetic checklist: patient identified, IV checked, site marked, risks and benefits discussed, surgical consent, monitors and equipment checked, pre-op evaluation, timeout performed and anesthesia consent Lidocaine 1% used for infiltration Left, radial was placed Catheter size: 20 G Hand hygiene performed  and maximum sterile barriers used   Attempts: 2 Procedure performed without using ultrasound guided technique. Following insertion, dressing applied and Biopatch. Post procedure assessment: normal and unchanged  Patient tolerated the procedure well with no immediate complications.

## 2017-11-10 NOTE — Anesthesia Preprocedure Evaluation (Addendum)
Anesthesia Evaluation  Patient identified by MRN, date of birth, ID band Patient awake    Reviewed: Allergy & Precautions, NPO status , Patient's Chart, lab work & pertinent test results  Airway Mallampati: II  TM Distance: >3 FB Neck ROM: Full    Dental  (+) Dental Advisory Given, Edentulous Upper, Poor Dentition, Chipped,    Pulmonary asthma , former smoker,    breath sounds clear to auscultation       Cardiovascular hypertension, Pt. on medications + Peripheral Vascular Disease   Rhythm:Regular Rate:Normal     Neuro/Psych  Headaches, Depression    GI/Hepatic Neg liver ROS, GERD  ,  Endo/Other  negative endocrine ROS  Renal/GU negative Renal ROS     Musculoskeletal  (+) Arthritis ,   Abdominal Normal abdominal exam  (+)   Peds  Hematology negative hematology ROS (+)   Anesthesia Other Findings - HLD  Reproductive/Obstetrics                            Lab Results  Component Value Date   WBC 10.5 05/25/2016   HGB 13.6 05/25/2016   HCT 40.0 05/25/2016   MCV 80.0 05/25/2016   PLT 218 05/25/2016   Lab Results  Component Value Date   CREATININE 0.74 05/26/2016   BUN 15 05/26/2016   NA 141 05/26/2016   K 3.2 (L) 05/26/2016   CL 103 05/26/2016   CO2 27 05/26/2016   Lab Results  Component Value Date   INR 1.04 05/25/2016   Echo: - Left ventricle: The cavity size was normal. There was severe   focal basal and mild concentric hypertrophy. Systolic function   was normal. The estimated ejection fraction was in the range of   60% to 65%. Wall motion was normal; there were no regional wall   motion abnormalities. There was an increased relative   contribution of atrial contraction to ventricular filling.   Doppler parameters are consistent with abnormal left ventricular   relaxation (grade 1 diastolic dysfunction). - Aortic valve: Trileaflet; normal thickness, mildly calcified  leaflets. - Mitral valve: There was mild regurgitation.   Anesthesia Physical Anesthesia Plan  ASA: III  Anesthesia Plan: General   Post-op Pain Management:    Induction: Intravenous  PONV Risk Score and Plan: 4 or greater and Ondansetron, Dexamethasone and Treatment may vary due to age or medical condition  Airway Management Planned: Oral ETT  Additional Equipment: Arterial line  Intra-op Plan:   Post-operative Plan: Extubation in OR  Informed Consent: I have reviewed the patients History and Physical, chart, labs and discussed the procedure including the risks, benefits and alternatives for the proposed anesthesia with the patient or authorized representative who has indicated his/her understanding and acceptance.   Dental advisory given  Plan Discussed with: CRNA  Anesthesia Plan Comments:        Anesthesia Quick Evaluation

## 2017-11-11 ENCOUNTER — Other Ambulatory Visit: Payer: Self-pay

## 2017-11-11 LAB — BASIC METABOLIC PANEL
ANION GAP: 8 (ref 5–15)
BUN: 19 mg/dL (ref 8–23)
CALCIUM: 8.6 mg/dL — AB (ref 8.9–10.3)
CO2: 24 mmol/L (ref 22–32)
CREATININE: 1 mg/dL (ref 0.44–1.00)
Chloride: 106 mmol/L (ref 98–111)
GFR calc Af Amer: >60 mL/min (ref >60)
GFR calc non Af Amer: 55 mL/min — ABNORMAL LOW (ref >60)
GLUCOSE: 177 mg/dL — AB (ref 70–99)
Potassium: 4.1 mmol/L (ref 3.5–5.1)
SODIUM: 138 mmol/L (ref 135–145)

## 2017-11-11 LAB — CBC
HEMATOCRIT: 34.3 % — AB (ref 36.0–46.0)
Hemoglobin: 11.2 g/dL — ABNORMAL LOW (ref 12.0–15.0)
MCH: 26.1 pg (ref 26.0–34.0)
MCHC: 32.7 g/dL (ref 30.0–36.0)
MCV: 80 fL (ref 78.0–100.0)
Platelets: 169 10*3/uL (ref 150–400)
RBC: 4.29 MIL/uL (ref 3.87–5.11)
RDW: 14.3 % (ref 11.5–15.5)
WBC: 17.9 10*3/uL — AB (ref 4.0–10.5)

## 2017-11-11 MED ORDER — ACETAMINOPHEN-CODEINE #3 300-30 MG PO TABS: 1.0000 | ORAL_TABLET | Freq: Four times a day (QID) | ORAL | 0 refills | Status: DC | PRN

## 2017-11-11 MED ORDER — OXYCODONE-ACETAMINOPHEN 5-325 MG PO TABS: 1.0000 | ORAL_TABLET | Freq: Four times a day (QID) | ORAL | 0 refills | Status: DC | PRN

## 2017-11-11 NOTE — Discharge Summary (Signed)
[facility] Discharge Summary   Kathryn [name] 04-Mar-1945 72 y.o. female  MRN: [id number]  Admission Date: 11/10/2017  Discharge Date: 11/11/17  Physician: Serafina Mitchell, MD  Admission Diagnosis: ABDOMINAL AORTIC ANEURYSM   HPI:   This is a 72 y.o. female who is is referred forevaluation of an abdominal aortic aneurysm. Sheinitiallypresented to the hospital in 4/2018with numbness in her left arm. She underwent a CT angiogram to rule out dissection. This was negative for dissection but did show a 4.4 x 4.7 infrarenal abdominal aortic aneurysm. During her visit she also had an MRI of the brain which was negative. She was ultimately diagnosed with cervical spondylolisthesis. She continues to have numbness in her left hand but this is improving.  The patient has been evaluated by cardiology in the past and has a negative stress test. She does have some shortness of breath. She takes a statin for hypercholesterolemia. Her blood pressure is well controlled on medication. She is a former smoker, but not currently.  She has been having back pain since this weekend which radiates around to her thighs.  Hospital Course:  The patient was admitted to the hospital and taken to the operating room on 11/10/2017 and underwent: Procedure:   #1:  Endovascular repair of infrarenal abdominal aortic aneurysm                         #2:  Coil embolization of left hypogastric aneurysm                         #3:  2nd order catheterization (left hypogastric artery)                         #3:  Pelvic angiogram                         #4:  Abdominal aortogram                         #5:  Distal extension x 1   Findings:  Complete exclusion  The pt tolerated the procedure well and was transported to the PACU in good condition.   By POD 1, the pt was doing well with palpable DP pulses bilaterally.  Her groins looked good without hematoma.  She has walked and her abdominal pain is mildly improved.   She is discharged home.   The remainder of the hospital course consisted of increasing mobilization and increasing intake of solids without difficulty.  CBC    Component Value Date/Time   WBC 17.9 (H) 11/11/2017 0430   RBC 4.29 11/11/2017 0430   HGB 11.2 (L) 11/11/2017 0430   HCT 34.3 (L) 11/11/2017 0430   PLT 169 11/11/2017 0430   MCV 80.0 11/11/2017 0430   MCH 26.1 11/11/2017 0430   MCHC 32.7 11/11/2017 0430   RDW 14.3 11/11/2017 0430   LYMPHSABS 3.0 05/25/2016 1148   MONOABS 0.6 05/25/2016 1148   EOSABS 0.0 05/25/2016 1148   BASOSABS 0.0 05/25/2016 1148    BMET    Component Value Date/Time   NA 138 11/11/2017 0430   K 4.1 11/11/2017 0430   CL 106 11/11/2017 0430   CO2 24 11/11/2017 0430   GLUCOSE 177 (H) 11/11/2017 0430   BUN 19 11/11/2017 0430   CREATININE 1.00 11/11/2017 0430   CREATININE 0.90 09/11/2015 1210  CALCIUM 8.6 (L) 11/11/2017 0430   GFRNONAA 55 (L) 11/11/2017 0430   GFRNONAA 86 04/15/2015 1528   GFRAA >60 11/11/2017 0430   GFRAA >89 04/15/2015 1528         Discharge Diagnosis:  ABDOMINAL AORTIC ANEURYSM  Secondary Diagnosis: Patient Active Problem List   Diagnosis Date Noted  . Cervical disc disorder with radiculopathy 06/04/2016  . Chronic respiratory failure with hypoxia and hypercapnia ([place]) 11/04/2015  . Thoracic aorta atherosclerosis ([place]) 08/13/2015  . Allergic sinusitis 04/15/2015  . History of recurrent UTI (urinary tract infection) 01/15/2015  . Peripheral arterial disease ([place]) 11/12/2014  . Osteoarthritis, multiple sites 11/12/2014  . History of colonic polyps 11/11/2014  . Protein-calorie malnutrition, severe ([place]) 11/27/2012  . Breast cancer ([place]) 11/24/2012  . AAA (abdominal aortic aneurysm) ([place]) 11/24/2012  . Depression 11/24/2012  . Hypertension   . Asthma    Past Medical History:  Diagnosis Date  . AAA (abdominal aortic aneurysm) ([place])   . Allergy   . Arthritis   . Asthma 2003  . Breast cancer ([place])    both  breasts/lt. mastectomy  . Cancer ([place]) 2006   both breasts/cannot use rt. side  . Cataract    both eyes  . GERD (gastroesophageal reflux disease) 2003  . Heart murmur   . Hyperlipidemia    pt. stopped taking her medication for this /will attempt change in diet  . Hypertension 1978  . Migraine headache 2008     Allergies as of 11/11/2017      Reactions   Ivp Dye [iodinated Diagnostic Agents] Hives   Sulfa Antibiotics Hives   Adhesive [tape] Rash   Hydrocodone-acetaminophen Itching   Lisinopril Cough   Lortab [hydrocodone-acetaminophen] Itching   Milk-related Compounds Other (See Comments)   Stomach pains, gas, bloating   Sodium Hypochlorite Other (See Comments)   wheezing      Medication List    TAKE these medications   azelastine 0.1 % nasal spray Commonly known as:  ASTELIN Place 1 spray into both nostrils 2 (two) times daily as needed for allergies. Use in each nostril as directed   DEXILANT 60 MG capsule Generic drug:  dexlansoprazole Take 60 mg by mouth daily before breakfast.   diltiazem 360 MG 24 hr capsule Commonly known as:  CARDIZEM CD Take 1 capsule (360 mg total) by mouth daily.   latanoprost 0.005 % ophthalmic solution Commonly known as:  XALATAN Place 1 drop into both eyes at bedtime.   levocetirizine 5 MG tablet Commonly known as:  XYZAL Take 5 mg by mouth every evening.   losartan 25 MG tablet Commonly known as:  COZAAR Take 25 mg by mouth daily.   oxyCODONE-acetaminophen 5-325 MG tablet Commonly known as:  PERCOCET/ROXICET Take 1 tablet by mouth every 6 (six) hours as needed for severe pain.   potassium chloride SA 20 MEQ tablet Commonly known as:  K-DUR,KLOR-CON Take 20 mEq by mouth every morning.   vitamin C 500 MG tablet Commonly known as:  ASCORBIC ACID Take 500 mg by mouth daily.       Discharge Instructions:  [facility]  Discharge Instructions Endovascular Aortic Aneurysm Repair  Please  refer to the following instructions for your post-procedure care. Your surgeon or Physician Assistant will discuss any changes with you.  Activity  You are encouraged to walk as much as you can. You can slowly return to normal activities but must avoid strenuous activity and heavy lifting until your doctor tells you  it's OK. Avoid activities such as vacuuming or swinging a gold club. It is normal to feel tired for several weeks after your surgery. Do not drive until your doctor gives the OK and you are no longer taking prescription pain medications. It is also normal to have difficulty with sleep habits, eating, and bowel movements after surgery. These will go away with time.  Bathing/Showering  You may shower after you go home. If you have an incision, do not soak in a bathtub, hot tub, or swim until the incision heals completely.  Incision Care  Shower every day. Clean your incision with mild soap and water. Pat the area dry with a clean towel. You do not need a bandage unless otherwise instructed. Do not apply any ointments or creams to your incision. If you clothing is irritating, you may cover your incision with a dry gauze pad.  Diet  Resume your normal diet. There are no special food restrictions following this procedure. A low fat/low cholesterol diet is recommended for all patients with vascular disease. In order to heal from your surgery, it is CRITICAL to get adequate nutrition. Your body requires vitamins, minerals, and protein. Vegetables are the best source of vitamins and minerals. Vegetables also provide the perfect balance of protein. Processed food has little nutritional value, so try to avoid this.  Medications  Resume taking all of your medications unless your doctor or Physician Assistnat tells you not to. If your incision is causing pain, you may take over-the-counter pain relievers such as acetaminophen (Tylenol). If you were prescribed a stronger pain medication, please be  aware these medications can cause nausea and constipation. Prevent nausea by taking the medication with a snack or meal. Avoid constipation by drinking plenty of fluids and eating foods with a high amount of fiber, such as fruits, vegetables, and grains. Do not take Tylenol if you are taking prescription pain medications.   Follow up  [facility] office will schedule a follow-up appointment with a C.T. scan 3-4 weeks after your surgery.  Please call us immediately for any of the following conditions  Severe or worsening pain in your legs or feet or in your abdomen back or chest. Increased pain, redness, drainage (pus) from your incision sit. Increased abdominal pain, bloating, nausea, vomiting or persistent diarrhea. Fever of 101 degrees or higher. Swelling in your leg (s),  Reduce your risk of vascular disease  .Stop smoking. If you would like help call [facility] at 1-800-QUIT-NOW [phone]) or [place] at [phone]. .Manage your cholesterol .Maintain a desired weight .Control your diabetes .Keep your blood pressure down  If you have questions, please call the office at [phone].    Prescriptions given: Tylenol #3 one q6h prn pain #8 No Refill  Disposition: home  Patient's condition: is Good  Follow up: 1. Dr. Trula Slade in 4 weeks with CTA protocol   Leontine Locket, PA-C [facility] [phone] 11/11/2017  7:44 AM   - For VQI Registry use - Post-op:  Time to Extubation: [x]  In OR, [ ]  < 12 hrs, [ ]  12-24 hrs, [ ]  >=24 hrs Vasopressors Req. Post-op: No MI: No., [ ]  Troponin only, [ ]  EKG or Clinical New Arrhythmia: No CHF: No ICU Stay: 1 day in stepdown Transfusion: No     If yes, n/a units given  Complications: Resp failure: No., [ ]  Pneumonia, [ ]  Ventilator Chg in renal function: No., [ ]  Inc. Cr > 0.5, [ ]  Temp. Dialysis,  [ ]  Permanent  dialysis Leg ischemia: No., no Surgery needed, [ ]  Yes, Surgery needed,  [ ]   Amputation Bowel ischemia: No., [ ]  Medical Rx, [ ]  Surgical Rx Wound complication: No., [ ]  Superficial separation/infection, [ ]  Return to OR Return to OR: No  Return to OR for bleeding: No Stroke: No., [ ]  Minor, [ ]  Major  Discharge medications: Statin use:  No  ASA use:  No  Plavix use:  No  Beta blocker use:  No  ARB use:  Yes ACEI use:  No CCB use:  Yes

## 2017-11-11 NOTE — Progress Notes (Addendum)
Progress Note    11/11/2017 7:27 AM 1 Day Post-Op  Subjective:  Ready to go home  Tm 99.2 now afebrile HR 60's-80's NSR 161'W-960'A systolic   Vitals:   54/09/Kathryn 2356 11/11/17 0415  BP: 128/68 (!) 143/86  Pulse: 87 83  Resp: 20 17  Temp: 99.3 F (37.4 C) 98.3 F (36.8 C)  SpO2: 92% 94%    Physical Exam: Cardiac:  regular Lungs:  Non labored Incisions:  Bilateral groins  Extremities:  +palpable DP pulses bilaterally Abdomen:  soft  CBC    Component Value Date/Time   WBC 17.9 (H) 11/11/2017 0430   RBC 4.29 11/11/2017 0430   HGB 11.2 (L) 11/11/2017 0430   HCT 34.3 (L) 11/11/2017 0430   PLT 169 11/11/2017 0430   MCV 80.0 11/11/2017 0430   MCH 26.1 11/11/2017 0430   MCHC 32.7 11/11/2017 0430   RDW 14.3 11/11/2017 0430   LYMPHSABS 3.0 05/25/2016 1148   MONOABS 0.6 05/25/2016 1148   EOSABS 0.0 05/25/2016 1148   BASOSABS 0.0 05/25/2016 1148    BMET    Component Value Date/Time   NA 138 11/11/2017 0430   K 4.1 11/11/2017 0430   CL 106 11/11/2017 0430   CO2 24 11/11/2017 0430   GLUCOSE 177 (H) 11/11/2017 0430   BUN 19 11/11/2017 0430   CREATININE 1.00 11/11/2017 0430   CREATININE 0.90 09/11/2015 1210   CALCIUM 8.6 (L) 11/11/2017 0430   GFRNONAA 55 (L) 11/11/2017 0430   GFRNONAA 86 04/15/2015 1528   GFRAA >60 11/11/2017 0430   GFRAA >89 04/15/2015 1528    INR    Component Value Date/Time   INR 1.11 11/10/2017 1140     Intake/Output Summary (Last 24 hours) at 11/11/2017 0727 Last data filed at 11/10/2017 2247 Gross per 24 hour  Intake 1800 ml  Output 2275 ml  Net -475 ml     Assessment:  72 y.o. Kathryn [name] is s/p:  Procedure:   #1:  Endovascular repair of infrarenal abdominal aortic aneurysm                         #2:  Coil embolization of left hypogastric aneurysm                         #3:  2nd order catheterization (left hypogastric artery)                         #3:  Pelvic angiogram                         #4:  Abdominal aortogram                     #5:  Distal extension x 1  1 Day Post-Op   Plan: -pt doing well this am with palpable DP pulses bilaterally.   -she is doing well and ready to go home. -f/u with Dr. Trula Slade in 4 weeks with CTA   Leontine Locket, PA-C [facility] [phone] 11/11/2017 7:27 AM   I have seen and evaluated the patient. I agree with the PA note as documented above. Groins look good with no hematoma.  Palpable pedal pulses.  Has been OOB and walked.  Doing well this am.  Abdominal pain mildly improved.  Plan for d/c home.  Marty Heck, MD [facility]  Office: [phone] Pager: [phone]  Marty Heck

## 2017-11-11 NOTE — Discharge Instructions (Signed)
  [facility]    Discharge Instructions  Endovascular Aortic Aneurysm Repair  Please refer to the following instructions for your post-procedure care. Your surgeon or Physician Assistant will discuss any changes with you.  Activity  You are encouraged to walk as much as you can. You can slowly return to normal activities but must avoid strenuous activity and heavy lifting until your doctor tells you it's OK. Avoid activities such as vacuuming or swinging a gold club. It is normal to feel tired for several weeks after your surgery. Do not drive until your doctor gives the OK and you are no longer taking prescription pain medications. It is also normal to have difficulty with sleep habits, eating, and bowel movements after surgery. These will go away with time.  Bathing/Showering  Shower daily after you go home.  Do not soak in a bathtub, hot tub, or swim until the incision heals completely.  If you have incisions in your groin, wash the groin wounds with soap and water daily and pat dry. (No tub bath-only shower)  Then put a dry gauze or washcloth there to keep this area dry to help prevent wound infection daily and as needed.  Do not use Vaseline or neosporin on your incisions.  Only use soap and water on your incisions and then protect and keep dry.  Incision Care  Shower every day. Clean your incision with mild soap and water. Pat the area dry with a clean towel. You do not need a bandage unless otherwise instructed. Do not apply any ointments or creams to your incision. If you clothing is irritating, you may cover your incision with a dry gauze pad.  Diet  Resume your normal diet. There are no special food restrictions following this procedure. A low fat/low cholesterol diet is recommended for all patients with vascular disease. In order to heal from your surgery, it is CRITICAL to get adequate nutrition. Your body requires vitamins, minerals, and protein.  Vegetables are the best source of vitamins and minerals. Vegetables also provide the perfect balance of protein. Processed food has little nutritional value, so try to avoid this.  Medications  Resume taking all of your medications unless your doctor or nurse practitioner tells you not to. If your incision is causing pain, you may take over-the-counter pain relievers such as acetaminophen (Tylenol). If you were prescribed a stronger pain medication, please be aware these medications can cause nausea and constipation. Prevent nausea by taking the medication with a snack or meal. Avoid constipation by drinking plenty of fluids and eating foods with a high amount of fiber, such as fruits, vegetables, and grains.  Do not take Tylenol if you are taking prescription pain medications.   Follow up  Our office will schedule a follow-up appointment with a CT scan 3-4 weeks after your surgery.  Please call us immediately for any of the following conditions  Severe or worsening pain in your legs or feet or in your abdomen back or chest. Increased pain, redness, drainage (pus) from your incision site. Increased abdominal pain, bloating, nausea, vomiting or persistent diarrhea. Fever of 101 degrees or higher. Swelling in your leg (s),  Reduce your risk of vascular disease  Stop smoking. If you would like help call [facility] at 1-800-QUIT-NOW (1-800-784-8669) or [place] at [phone]. Manage your cholesterol Maintain a desired weight Control your diabetes Keep your blood pressure down  If you have questions, please call the office at [phone].  

## 2017-11-14 ENCOUNTER — Other Ambulatory Visit: Payer: Self-pay

## 2017-11-14 DIAGNOSIS — I714 Abdominal aortic aneurysm, without rupture, unspecified: Secondary | ICD-10-CM

## 2017-11-15 ENCOUNTER — Telehealth: Payer: Self-pay | Admitting: *Deleted

## 2017-11-15 ENCOUNTER — Telehealth: Payer: Self-pay

## 2017-11-15 ENCOUNTER — Encounter (HOSPITAL_COMMUNITY): Payer: Self-pay | Admitting: Surgery

## 2017-11-15 NOTE — Telephone Encounter (Signed)
Patient called c/o LLE and Left buttock pain worse with ambulation. Denies any signs or symptoms of site infection. "Sites look good". States extemity has good CMS when questioned. No Rx pain med left. Encouraged to take non-narcotic medication for pain and be careful to avoid fall. Agreeable to see NP on 11/17/17. Instructed to go to [facility] ER for worsening or emergency condition.

## 2017-11-15 NOTE — Telephone Encounter (Signed)
13-hr prep phoned in to pt's [facility] in [facility] and message left on pt's voice mail stating this has been done.  Prednisone 50mg  PO 12/11/17 @ 2300, 12/12/17 @ 0500 and 1100.  Benadryl 50mg  PO 12/12/17 @ 1100.  jkl

## 2017-11-17 ENCOUNTER — Encounter: Payer: Self-pay | Admitting: Family

## 2017-11-17 ENCOUNTER — Ambulatory Visit (HOSPITAL_COMMUNITY)
Admission: RE | Admit: 2017-11-17 | Discharge: 2017-11-17 | Disposition: A | Discharge status: Home / Self Care | Payer: Medicare HMO | Source: Ambulatory Visit | Attending: Family | Admitting: Family

## 2017-11-17 ENCOUNTER — Other Ambulatory Visit: Payer: Self-pay

## 2017-11-17 ENCOUNTER — Ambulatory Visit (INDEPENDENT_AMBULATORY_CARE_PROVIDER_SITE_OTHER): Payer: Self-pay | Admitting: Family

## 2017-11-17 VITALS — BP 108/71 | HR 79 | Temp 98.7°F | Resp 16 | Ht 65.5 in | Wt 169.4 lb

## 2017-11-17 DIAGNOSIS — Z95828 Presence of other vascular implants and grafts: Secondary | ICD-10-CM

## 2017-11-17 DIAGNOSIS — M7918 Myalgia, other site: Secondary | ICD-10-CM

## 2017-11-17 DIAGNOSIS — I714 Abdominal aortic aneurysm, without rupture, unspecified: Secondary | ICD-10-CM

## 2017-11-17 DIAGNOSIS — G8918 Other acute postprocedural pain: Secondary | ICD-10-CM

## 2017-11-17 DIAGNOSIS — R195 Other fecal abnormalities: Secondary | ICD-10-CM

## 2017-11-17 DIAGNOSIS — R1032 Left lower quadrant pain: Secondary | ICD-10-CM

## 2017-11-17 DIAGNOSIS — R1012 Left upper quadrant pain: Secondary | ICD-10-CM

## 2017-11-17 NOTE — Progress Notes (Signed)
Post-operative EVAR  CC:  LLE,  Left buttock cramping pain, left groin worse with ambulation, x 5 days; left groin lump  History of Present Illness  Kathryn [name] is a 72 y.o. female who is s/p Endovascular repair of infrarenal abdominal aortic aneurysm with GORE 23x14x18, R 23x12, R23x10, and Coil embolization of left hypogastric aneurysm with Interloclk:  12x20, 10x40, 15x40 on 11-10-17 by Dr. Trula Slade for AAA.   On 11-07-17 when Dr. Trula Slade evaluated pt prior to the EVAR,  patient's aorta was tender to palpation that day.  Pt returns today with c/o LLE,  Left buttock cramping pain, and left groin worse with ambulation, started 5 days ago, is worsening per pt.. She noted swelling in her left groin this morning.  She states that he left upper and left lower abdomen was painful to palpation before the EVAR surgery.  Her back has not hurt since the EVAR; states her back hurt all the time before the EVAR.  She denies fever or chills.   She sees a gastroenterologist with [facility] in [place], states she was told she has some ulcers. She noted dark stools since the EVAR. She denies taking iron.    Diabetic:No Tobacco NAT:FTDDUK smoker, quitin 2003, smoked x 10years . Started smokeless tobacco in 2003 to the present  For VQI Use Only  PRE-ADM LIVING: Home  AMB STATUS: Ambulatory   Past Medical History:  Diagnosis Date  . AAA (abdominal aortic aneurysm) ([place])   . Allergy   . Arthritis   . Asthma 2003  . Breast cancer ([place])    both breasts/lt. mastectomy  . Cancer ([place]) 2006   both breasts/cannot use rt. side  . Cataract    both eyes  . GERD (gastroesophageal reflux disease) 2003  . Heart murmur   . Hyperlipidemia    pt. stopped taking her medication for this /will attempt change in diet  . Hypertension 1978  . Migraine headache 2008    Past Surgical History:  Procedure Laterality Date  . ABDOMINAL AORTIC ENDOVASCULAR STENT GRAFT N/A 11/10/2017   Procedure: ABDOMINAL  AORTIC ENDOVASCULAR STENT GRAFT;  Surgeon: Serafina Mitchell, MD;  Location: [facility];  Service: Vascular;  Laterality: N/A;  . ABDOMINAL HYSTERECTOMY  1989  . BREAST LUMPECTOMY Left   . BREAST SURGERY    . CHOLECYSTECTOMY  1985  . COLONOSCOPY    . DILATION AND CURETTAGE OF UTERUS    . EMBOLIZATION Left 11/10/2017   Procedure: COILING EMBOLIZATION OF LEFT HYPOGASTRIC;  Surgeon: Serafina Mitchell, MD;  Location: [facility];  Service: Vascular;  Laterality: Left;  . lipoma removed      back  . MASTECTOMY Right    malignant  . POLYPECTOMY    . ROTATOR CUFF REPAIR  2007   lt. shoulder  . TONSILLECTOMY    . TUBAL LIGATION      Family History  Problem Relation Age of Onset  . Cancer Mother   . Heart failure Father   . Kidney failure Brother   . Hypertension Brother   . Heart disease Sister   . Stroke Sister   . Stroke Sister   . Colon cancer Neg Hx   . Colon polyps Neg Hx   . Esophageal cancer Neg Hx   . Stomach cancer Neg Hx   . Rectal cancer Neg Hx     Social History   Socioeconomic History  . Marital status: Divorced    Spouse name: Not on file  . Number of  children: Not on file  . Years of education: Not on file  . Highest education level: Not on file  Occupational History  . Not on file  Social Needs  . Financial resource strain: Not on file  . Food insecurity:    Worry: Not on file    Inability: Not on file  . Transportation needs:    Medical: Not on file    Non-medical: Not on file  Tobacco Use  . Smoking status: Former Smoker    Packs/day: 1.00    Years: 10.00    Pack years: 10.00    Types: Cigarettes    Last attempt to quit: 05/16/2001    Years since quitting: 16.5  . Smokeless tobacco: Former Systems developer    Types: Snuff    Quit date: 10/21/2001  Substance and Sexual Activity  . Alcohol use: No    Alcohol/week: 0.0 standard drinks  . Drug use: No  . Sexual activity: Not Currently    Partners: Male  Lifestyle  . Physical activity:    Days per week: Not on file     Minutes per session: Not on file  . Stress: Not on file  Relationships  . Social connections:    Talks on phone: Not on file    Gets together: Not on file    Attends religious service: Not on file    Active member of club or organization: Not on file    Attends meetings of clubs or organizations: Not on file    Relationship status: Not on file  . Intimate partner violence:    Fear of current or ex partner: Not on file    Emotionally abused: Not on file    Physically abused: Not on file    Forced sexual activity: Not on file  Other Topics Concern  . Not on file  Social History Narrative  . Not on file    Allergies  Allergen Reactions  . Ivp Dye [Iodinated Diagnostic Agents] Hives  . Sulfa Antibiotics Hives  . Adhesive [Tape] Rash  . Hydrocodone-Acetaminophen Itching  . Lisinopril Cough  . Lortab [Hydrocodone-Acetaminophen] Itching  . Milk-Related Compounds Other (See Comments)    Stomach pains, gas, bloating  . Percocet [Oxycodone-Acetaminophen] Itching    States only pain med she can take is tylenol or tylenol #3  . Sodium Hypochlorite Other (See Comments)    wheezing    Current Outpatient Medications on File Prior to Visit  Medication Sig Dispense Refill  . acetaminophen-codeine (TYLENOL #3) 300-30 MG tablet Take 1 tablet by mouth every 6 (six) hours as needed for moderate pain. 8 tablet 0  . azelastine (ASTELIN) 0.1 % nasal spray Place 1 spray into both nostrils 2 (two) times daily as needed for allergies. Use in each nostril as directed    . dexlansoprazole (DEXILANT) 60 MG capsule Take 60 mg by mouth daily before breakfast.    . diltiazem (CARDIZEM CD) 360 MG 24 hr capsule Take 1 capsule (360 mg total) by mouth daily. 90 capsule 0  . latanoprost (XALATAN) 0.005 % ophthalmic solution Place 1 drop into both eyes at bedtime.    Marland Kitchen levocetirizine (XYZAL) 5 MG tablet Take 5 mg by mouth every evening.    Marland Kitchen losartan (COZAAR) 25 MG tablet Take 25 mg by mouth daily.    .  potassium chloride SA (K-DUR,KLOR-CON) 20 MEQ tablet Take 20 mEq by mouth every morning.     . vitamin C (ASCORBIC ACID) 500 MG tablet Take 500 mg by  mouth daily.     No current facility-administered medications on file prior to visit.       Physical Examination  Vitals:   11/17/17 1311  BP: 108/71  Pulse: 79  Resp: 16  Temp: 98.7 F (37.1 C)  TempSrc: Oral  SpO2: 97%  Weight: 169 lb 6.4 oz (76.8 kg)  Height: 5' 5.5" (1.664 m)   Body mass index is 27.76 kg/m.  Vascular: Vessel Right Left  Aorta  Non-palpable N/A  Femoral 2+ Palpable not Palpable (tender)  Popliteal  Non-palpable  Non-palpable  PT not Palpable not Palpable  DP  Palpable  Palpable   Gastrointestinal: soft, left upper and lower quadrants tender to palpation, - masses, - CVAT B   Non-Invasive Vascular Imaging  Left groin duplex (11-17-17): No pseudoaneurysm of left groin, no DVT, no AVF.   Medical Decision Making  Kathryn [name] is a 72 y.o. female who presents s/p EVAR with GORE 23x14x18, R 23x12, R23x10, and Coil embolization of left hypogastric aneurysm with Interloclk:  12x20, 10x40, 15x40 on 11-10-17 by Dr. Trula Slade for AAA.   - Pain in Left groin x 5 days: hematoma (2 cm x 3.5 cm) at EVAR cannulation site. No pseudoaneurysm of left groin, no DVT, no AVF on arterial duplex today of left groin. I advised pt to notify us if the pain in her left groin worsens.    - Left buttock pain with radiation to left thigh: possibly from coil embolization of left hypogastric aneurysm.    - LUQ and LLQ abdominal pain with palpation (present before the EVAR) and dark stool: I advised ot to contact her gastroenterologist re this.   Follow up as scheduled with CTA abd/pelvis on 12-12-17, see Dr. Trula Slade on 12-19-17.    Thank you for allowing [place] to participate in this patient's care.  Clemon Chambers, RN, MSN, FNP-C [facility] Office: [phone]  11/17/2017, 1:28 PM  Clinic MD:  Oneida Alar

## 2017-11-30 ENCOUNTER — Other Ambulatory Visit: Payer: Self-pay | Admitting: Surgery

## 2017-12-12 ENCOUNTER — Ambulatory Visit
Admission: RE | Admit: 2017-12-12 | Discharge: 2017-12-12 | Disposition: A | Discharge status: Home / Self Care | Payer: Medicare HMO | Source: Ambulatory Visit | Attending: Surgery | Admitting: Surgery

## 2017-12-12 DIAGNOSIS — I714 Abdominal aortic aneurysm, without rupture, unspecified: Secondary | ICD-10-CM

## 2017-12-12 MED ORDER — IOPAMIDOL (ISOVUE-370) INJECTION 76%
75.0000 mL | Freq: Once | INTRAVENOUS | Status: AC | PRN
  Administered 2017-12-12: 75 mL via INTRAVENOUS

## 2017-12-19 ENCOUNTER — Other Ambulatory Visit: Payer: Self-pay

## 2017-12-19 ENCOUNTER — Ambulatory Visit: Payer: Medicare HMO | Admitting: Family

## 2017-12-19 ENCOUNTER — Encounter: Payer: Self-pay | Admitting: Surgery

## 2017-12-19 ENCOUNTER — Ambulatory Visit (INDEPENDENT_AMBULATORY_CARE_PROVIDER_SITE_OTHER): Payer: Self-pay | Admitting: Surgery

## 2017-12-19 VITALS — BP 159/97 | HR 86 | Resp 16 | Ht 65.5 in | Wt 171.0 lb

## 2017-12-19 DIAGNOSIS — I714 Abdominal aortic aneurysm, without rupture, unspecified: Secondary | ICD-10-CM

## 2017-12-19 NOTE — Progress Notes (Signed)
Patient name: Kathryn [name] MRN: [id number] DOB: June 09, 1945 Sex: female  REASON FOR VISIT:     post op  HISTORY OF PRESENT ILLNESS:   Kathryn [name] is a 72 y.o. female who I have been following for a abdominal aortic aneurysm.  When I saw her in September 2019, the diameter had increased to 5 cm.  She was having abdominal pain and so we elected to fix her more urgently.  On 11/10/2017 she underwent endovascular repair of a 5 cm aneurysm.  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  She was seen in the office on 11/17/2017 with left buttock pain and abdominal pain.  These have all resolved.  The patient is walking without significant limitations regarding her left buttock pain.  She does not have any abdominal pain.  She does not have any discolored stools.    CURRENT MEDICATIONS:    Current Outpatient Medications  Medication Sig Dispense Refill  . acetaminophen-codeine (TYLENOL #3) 300-30 MG tablet Take 1 tablet by mouth every 6 (six) hours as needed for moderate pain. 8 tablet 0  . azelastine (ASTELIN) 0.1 % nasal spray Place 1 spray into both nostrils 2 (two) times daily as needed for allergies. Use in each nostril as directed    . dexlansoprazole (DEXILANT) 60 MG capsule Take 60 mg by mouth daily before breakfast.    . diltiazem (CARDIZEM CD) 360 MG 24 hr capsule Take 1 capsule (360 mg total) by mouth daily. 90 capsule 0  . latanoprost (XALATAN) 0.005 % ophthalmic solution Place 1 drop into both eyes at bedtime.    Marland Kitchen levocetirizine (XYZAL) 5 MG tablet Take 5 mg by mouth every evening.    Marland Kitchen losartan (COZAAR) 25 MG tablet Take 25 mg by mouth daily.    . potassium chloride SA (K-DUR,KLOR-CON) 20 MEQ tablet Take 20 mEq by mouth every morning.     . vitamin C (ASCORBIC ACID) 500 MG tablet Take 500 mg by mouth daily.     No current facility-administered medications for this visit.     REVIEW OF SYSTEMS:   [X]  denotes  positive finding, [ ]  denotes negative finding Cardiac  Comments:  Chest pain or chest pressure:    Shortness of breath upon exertion:    Short of breath when lying flat:    Irregular heart rhythm:    Constitutional    Fever or chills:      PHYSICAL EXAM:   Vitals:   12/19/17 0839  BP: (!) 159/97  Pulse: 86  Resp: 16  SpO2: 99%  Weight: 171 lb (77.6 kg)  Height: 5' 5.5" (1.664 m)    GENERAL: The patient is a well-nourished female, in no acute distress. The vital signs are documented above. CARDIOVASCULAR: There is a regular rate and rhythm. PULMONARY: Non-labored respirations Abdomen is soft and nontender  STUDIES:   CTA:  VASCULAR  1. Successful endovascular aortic repair of both the infrarenal abdominal aortic aneurysm and the left internal iliac artery aneurysm. The excluded aneurysm sacs have both slightly decreased in size and demonstrate no evidence of endoleak. Aortic aneurysm now measures a maximum of 4.7 cm while the left internal iliac artery aneurysm measures up to 2.2 cm. Aortic aneurysm NOS (ICD10-I71.9); Aortic Atherosclerosis (ICD10-170.0). 2. No evidence of access site complication.  NON-VASCULAR  Ancillary findings as above without significant interval change compared to relatively recent prior imaging.   MEDICAL ISSUES:   AAA: Successful exclusion of her aneurysm.  Her left buttock  claudication which was related to her hypogastric artery embolization has dramatically improved.  She will follow-up in 6 months with a abdominal ultrasound.  Because of the urgency of her repair I did not check preoperative carotid duplex, so I will do that in 6 months.  [name], MD [facility] [phone] Pager [phone]

## 2018-02-10 ENCOUNTER — Other Ambulatory Visit: Payer: Self-pay | Admitting: Internal Medicine

## 2018-02-10 DIAGNOSIS — Z13820 Encounter for screening for osteoporosis: Secondary | ICD-10-CM

## 2018-02-23 ENCOUNTER — Ambulatory Visit (HOSPITAL_COMMUNITY)
Admission: RE | Admit: 2018-02-23 | Discharge: 2018-02-23 | Disposition: A | Discharge status: Home / Self Care | Payer: Medicare HMO | Source: Ambulatory Visit | Attending: Internal Medicine | Admitting: Internal Medicine

## 2018-02-23 ENCOUNTER — Other Ambulatory Visit (HOSPITAL_COMMUNITY): Payer: Self-pay | Admitting: Internal Medicine

## 2018-02-23 ENCOUNTER — Other Ambulatory Visit (HOSPITAL_COMMUNITY): Payer: Self-pay

## 2018-02-23 DIAGNOSIS — R52 Pain, unspecified: Secondary | ICD-10-CM | POA: Insufficient documentation

## 2018-02-28 ENCOUNTER — Encounter: Payer: Self-pay | Admitting: Internal Medicine

## 2018-02-28 ENCOUNTER — Ambulatory Visit (INDEPENDENT_AMBULATORY_CARE_PROVIDER_SITE_OTHER)
Admission: RE | Admit: 2018-02-28 | Discharge: 2018-02-28 | Disposition: A | Discharge status: Home / Self Care | Payer: Medicare HMO | Source: Ambulatory Visit | Attending: Internal Medicine | Admitting: Internal Medicine

## 2018-02-28 ENCOUNTER — Ambulatory Visit (INDEPENDENT_AMBULATORY_CARE_PROVIDER_SITE_OTHER): Payer: Medicare HMO | Admitting: Internal Medicine

## 2018-02-28 VITALS — BP 130/70 | HR 75 | Ht 65.5 in | Wt 177.4 lb

## 2018-02-28 DIAGNOSIS — R0609 Other forms of dyspnea: Secondary | ICD-10-CM | POA: Diagnosis not present

## 2018-02-28 DIAGNOSIS — R05 Cough: Secondary | ICD-10-CM | POA: Diagnosis not present

## 2018-02-28 DIAGNOSIS — R058 Other specified cough: Secondary | ICD-10-CM

## 2018-02-28 LAB — BASIC METABOLIC PANEL
BUN: 14 mg/dL (ref 6–23)
CO2: 30 mEq/L (ref 19–32)
Calcium: 8.7 mg/dL (ref 8.4–10.5)
Chloride: 104 mEq/L (ref 96–112)
Creatinine, Ser: 0.92 mg/dL (ref 0.40–1.20)
GFR: 76.7 mL/min (ref >60.00)
Glucose, Bld: 154 mg/dL — ABNORMAL HIGH (ref 70–99)
Potassium: 3.4 mEq/L — ABNORMAL LOW (ref 3.5–5.1)
Sodium: 141 mEq/L (ref 135–145)

## 2018-02-28 LAB — CBC WITH DIFFERENTIAL/PLATELET
Basophils Absolute: 0 10*3/uL (ref 0.0–0.1)
Basophils Relative: 0.4 % (ref 0.0–3.0)
EOS PCT: 1 % (ref 0.0–5.0)
Eosinophils Absolute: 0.1 10*3/uL (ref 0.0–0.7)
HCT: 34.6 % — ABNORMAL LOW (ref 36.0–46.0)
Hemoglobin: 11.3 g/dL — ABNORMAL LOW (ref 12.0–15.0)
Lymphocytes Relative: 21.9 % (ref 12.0–46.0)
Lymphs Abs: 1.6 10*3/uL (ref 0.7–4.0)
MCHC: 32.7 g/dL (ref 30.0–36.0)
MCV: 79.5 fl (ref 78.0–100.0)
Monocytes Absolute: 0.6 10*3/uL (ref 0.1–1.0)
Monocytes Relative: 8.7 % (ref 3.0–12.0)
Neutro Abs: 4.8 10*3/uL (ref 1.4–7.7)
Neutrophils Relative %: 68 % (ref 43.0–77.0)
Platelets: 191 10*3/uL (ref 150.0–400.0)
RBC: 4.35 Mil/uL (ref 3.87–5.11)
RDW: 15.6 % — ABNORMAL HIGH (ref 11.5–15.5)
WBC: 7.1 10*3/uL (ref 4.0–10.5)

## 2018-02-28 LAB — TSH: TSH: 1.17 u[IU]/mL (ref 0.35–4.50)

## 2018-02-28 LAB — BRAIN NATRIURETIC PEPTIDE: Pro B Natriuretic peptide (BNP): 56 pg/mL (ref 0.0–100.0)

## 2018-02-28 MED ORDER — FAMOTIDINE 20 MG PO TABS: ORAL_TABLET | ORAL | 11 refills | Status: AC

## 2018-02-28 NOTE — Progress Notes (Signed)
Subjective:   Patient ID: Kathryn [name], female    DOB: December 10, 1945    MRN: [id number]     Brief patient profile:  52 yobf  [facility]  previously rx with albuterol that didn't really help and worse x late spring 2017 with neg cards eval so referred to pulmonary clinic 11/03/2015 by Dr   Kathryn [name] at [facility] with nl spirometry on initial eval     History of Present Illness  11/03/2015 1st [facility] office visit/ Kathryn [name]   Chief Complaint  Patient presents with  . Advice Only    C/o SOB w/ exertion, chest heaviness, dry cough x 1 month.    doe x room to room comfortable - sitting still and sleeping assoc with dry cough  rec Pantoprazole (protonix) 40 mg   Take  30-60 min before first meal of the day and Pepcid (famotidine)  20 mg one @  bedtime until return to office   GERD diet    02/28/2018  Extended  ov/Kathryn [name] re: re-establish care re  Doe/cough ? AB  Chief Complaint  Patient presents with  . Acute Visit    Pt c/o increased DOE for the past few months. She gets winded drying off after her shower and walking short distances.   Dyspnea: room to room / hang on a cart at [employer]  = MMRC3 = can't walk 100 yards even at a slow pace at a flat grade s stopping due to sob  X 2003  (not reproduced at ov though reported as a daily symptom x 17 years)  Cough: dry immediately at hs then goes away but  Pattern present 3-4 nights per week  x 2018 typically s  am flares Sleeping: flat bed, sev pillows under head   SABA use: last used 6 h prior to OV  Helped wheezing  02: none   No obvious day to day or daytime variability or assoc excess/ purulent sputum or mucus plugs or hemoptysis or cp or chest tightness,   or overt sinus or hb symptoms.   Sleeping ok once cough resolves  without nocturnal  or early am exacerbation  of respiratory  c/o's or need for noct saba. Also denies any obvious fluctuation of symptoms with weather or environmental changes or other aggravating  or alleviating factors except as outlined above   No unusual exposure hx or h/o childhood pna/ asthma or knowledge of premature birth.  Current Allergies, Complete Past Medical History, Past Surgical History, Family History, and Social History were reviewed in [employer] record.  ROS  The following are not active complaints unless bolded Hoarseness, sore throat, dysphagia, dental problems, itching, sneezing,  nasal congestion or discharge of excess mucus or purulent secretions, ear ache,   fever, chills, sweats, unintended wt loss or wt gain, classically pleuritic or exertional cp,  orthopnea pnd or arm/hand swelling  or leg swelling, presyncope, palpitations, abdominal pain, anorexia, nausea, vomiting, diarrhea  or change in bowel habits or change in bladder habits, change in stools or change in urine, dysuria, hematuria,  rash, arthralgias, visual complaints, headache, numbness, weakness or ataxia or problems with walking or coordination,  change in mood or  memory.        Current Meds  Medication Sig  . azelastine (ASTELIN) 0.1 % nasal spray Place 1 spray into both nostrils 2 (two) times daily as needed for allergies. Use in each nostril as directed  . dexlansoprazole (DEXILANT) 60 MG  capsule Take 60 mg by mouth daily before breakfast.  . diltiazem (CARDIZEM CD) 360 MG 24 hr capsule Take 1 capsule (360 mg total) by mouth daily.  Marland Kitchen latanoprost (XALATAN) 0.005 % ophthalmic solution Place 1 drop into both eyes at bedtime.  Marland Kitchen levocetirizine (XYZAL) 5 MG tablet Take 5 mg by mouth every evening.  Marland Kitchen losartan (COZAAR) 25 MG tablet Take 25 mg by mouth daily.  . potassium chloride SA (K-DUR,KLOR-CON) 20 MEQ tablet Take 20 mEq by mouth every morning.   . vitamin C (ASCORBIC ACID) 500 MG tablet Take 500 mg by mouth daily.                 Objective:   Physical Exam   amb bf nad / min pseudowheeze  02/28/2018        177   11/03/15 153 lb (69.4 kg)  10/13/15 155 lb (70.3  kg)  09/19/15 151 lb (68.5 kg)      .Vital signs reviewed - Note on arrival 02 sats  98% on RA        HEENT:  No upper teeth/ lower poor dentition, nl turbinates bilaterally, and oropharynx. Nl external ear canals without cough reflex   NECK :  without JVD/Nodes/TM/ nl carotid upstrokes bilaterally   LUNGS: no acc muscle use,  Nl contour chest which is clear to A and P bilaterally without cough on insp or exp maneuvers   CV:  RRR  no s3   II/VI sem but no  increase in P2, and no edema   ABD:  Mod obese/ soft and nontender with nl inspiratory excursion in the supine position. No bruits or organomegaly appreciated, bowel sounds nl  MS:  Nl gait/ ext warm without deformities, calf tenderness, cyanosis or clubbing No obvious joint restrictions   SKIN: warm and dry without lesions    NEURO:  alert, approp, nl sensorium with  no motor or cerebellar deficits apparent.             CXR PA and Lateral:   02/28/2018 :    I personally reviewed images and agree with radiology impression as follows:   Mild emphysematous changes without acute infiltrate. My review:  Very little to suggest emphysema which was not present on prior CT's     Labs ordered/ reviewed:      Chemistry      Component Value Date/Time   NA 141 02/28/2018 1442   K 3.4 (L) 02/28/2018 1442   CL 104 02/28/2018 1442   CO2 30 02/28/2018 1442   BUN 14 02/28/2018 1442   CREATININE 0.92 02/28/2018 1442   CREATININE 0.90 09/11/2015 1210      Component Value Date/Time   CALCIUM 8.7 02/28/2018 1442   ALKPHOS 60 11/10/2017 0627   AST 15 11/10/2017 0627   ALT 11 11/10/2017 0627   BILITOT 0.4 11/10/2017 0627        Lab Results  Component Value Date   WBC 7.1 02/28/2018   HGB 11.3 (L) 02/28/2018   HCT 34.6 (L) 02/28/2018   MCV 79.5 02/28/2018   PLT 191.0 02/28/2018       Lab Results  Component Value Date   TSH 1.17 02/28/2018     Lab Results  Component Value Date   PROBNP 56.0 02/28/2018                   Assessment & Plan:

## 2018-02-28 NOTE — Patient Instructions (Addendum)
Continue dexilant Take 30-60 min before first meal of the day and add pepcid 20 mg one hour before bedtime   If still coughing at bedtime after 2 weeks then add   CHLORPHENIRAMINE  4 mg  (chlortabs at [facility])  so start with just a   dose or two one hour before bedtime  and see how you tolerate it before trying in daytime     GERD (REFLUX)  is an extremely common cause of respiratory symptoms just like yours , many times with no obvious heartburn at all.    It can be treated with medication, but also with lifestyle changes including elevation of the head of your bed (ideally with 6 -8inch blocks under the headboard of your bed),  Smoking cessation, avoidance of late meals, excessive alcohol, and avoid fatty foods, chocolate, peppermint, colas, red wine, and acidic juices such as orange juice.  NO MINT OR MENTHOL PRODUCTS SO NO COUGH DROPS  USE SUGARLESS CANDY INSTEAD (Jolley ranchers or Stover's or Life Savers) or even ice chips will also do - the key is to swallow to prevent all throat clearing. NO OIL BASED VITAMINS - use powdered substitutes.  Avoid fish oil when coughing.    Please remember to go to the lab and x-ray department   for your tests - we will call you with the results when they are available.   Please schedule a follow up office visit in 4 weeks, sooner if needed  with all medications /inhalers/ solutions in hand so we can verify exactly what you are taking. This includes all medications from all doctors and over the counters - PFTs on return  - needs fe studies/repeat cbc on return also

## 2018-03-01 ENCOUNTER — Encounter: Payer: Self-pay | Admitting: Internal Medicine

## 2018-03-01 DIAGNOSIS — R05 Cough: Secondary | ICD-10-CM | POA: Insufficient documentation

## 2018-03-01 DIAGNOSIS — R058 Other specified cough: Secondary | ICD-10-CM | POA: Insufficient documentation

## 2018-03-01 NOTE — Assessment & Plan Note (Signed)
Upper airway cough syndrome (previously labeled PNDS),  is so named because it's frequently impossible to sort out how much is  CR/sinusitis with freq throat clearing (which can be related to primary GERD)   vs  causing  secondary (" extra esophageal")  GERD from wide swings in gastric pressure that occur with throat clearing, often  promoting self use of mint and menthol lozenges that reduce the lower esophageal sphincter tone and exacerbate the problem further in a cyclical fashion.   These are the same pts (now being labeled as having "irritable larynx syndrome" by some cough centers) who not infrequently have a history of having failed to tolerate ace inhibitors,  dry powder inhalers or biphosphonates or report having atypical/extraesophageal reflux symptoms that don't respond to standard doses of PPI  and are easily confused as having aecopd or asthma flares by even experienced allergists/ pulmonologists (myself included).    Try max rx for gerd first along with 1st gen H1 blockers per guidelines  Then regroup with pfts    I had an extended discussion with the patient   reviewing all relevant studies completed to date and  lasting 15 to 20 minutes of a 25 minute re-establish care ov  which included directly observing ambulatory 02 saturation study documented in a/p section of  today's  office note.  Each maintenance medication was reviewed in detail including most importantly the difference between maintenance and prns and under what circumstances the prns are to be triggered using an action plan format that is not reflected in the computer generated alphabetically organized AVS.     Please see AVS for specific instructions unique to this visit that I personally wrote and verbalized to the the pt in detail and then reviewed with pt  by my nurse highlighting any changes in therapy recommended at today's visit .

## 2018-03-01 NOTE — Progress Notes (Signed)
Spoke with pt and notified of results per Dr. Wert. Pt verbalized understanding and denied any questions. 

## 2018-03-01 NOTE — Assessment & Plan Note (Addendum)
[place] 2003 [place]03[place]  Walked RA x 2 laps @ 185 ft each stopped due to fatigue and sob, sats down to 88%    - Spirometry 11/03/2015  wnl with very min curvature   - max rx for GERD initiated 11/03/2015  - Allergy profile 11/03/15   Eos 0.0 /  IgE  46 neg RAST    - 02/28/2018   Walked RA  2 laps @ 238ft each @ slt faster than avg  pace  stopped due to end of study /sob but no desats  - Spirometry 02/28/2018  FEV1 2.0 (108%)  Ratio 73 6 h p last saba      Getting progressively worse over time assoc with wt gain but still Symptoms (doe across the room)  are markedly disproportionate to objective findings and not clear to what extent this is actually a pulmonary  problem but pt does appear to have difficult to sort out respiratory symptoms of unknown origin for which  DDX  = almost all start with A and  include Adherence, Ace Inhibitors, Acid Reflux, Active Sinus Disease, Alpha 1 Antitripsin deficiency, Anxiety masquerading as Airways dz,  ABPA,  Allergy(esp in young), Aspiration (esp in elderly), Adverse effects of meds,  Active smoking or Vaping, A bunch of PE's/clot burden (a few small clots can't cause this syndrome unless there is already severe underlying pulm or vascular dz with poor reserve),  Anemia or thyroid disorder, plus two Bs  = Bronchiectasis and Beta blocker use..and one C= CHF   Adherence is always the initial "prime suspect" and is a multilayered concern that requires a "trust but verify" approach in every patient - starting with knowing how to use medications, especially inhalers, correctly, keeping up with refills and understanding the fundamental difference between maintenance and prns vs those medications only taken for a very short course and then stopped and not refilled.  - return with all meds in hand using a trust but verify approach to confirm accurate Medication  Reconciliation The principal here is that until we are certain that the  patients are doing  what we've asked, it makes no sense to ask them to do more.   ? Acid (or non-acid) GERD > always difficult to exclude as up to 75% of pts in some series report no assoc GI/ Heartburn symptoms> rec max (24h)  acid suppression and diet restrictions/ reviewed and instructions given in writing.   ? Allergy/ asthma > neg profile but still feeling needs saba so return for pfts s saba that day   ? Anxiety/depression/ wt gain / deconditioning  > usually at the bottom of this list of usual suspects but should be included here   and may interfere with adherence and also interpretation of response or lack thereof to symptom management which can be quite subjective.   ? thryoid dz/ anemia > she is borderline anemic and microcytic so that will need f/u when returns if not addressed by pcp in meantime   ? chf > she does have diastolic dysfunction and MR per prev echo but bnp so low rules strongly against significant chf here - might consider echo to r/o PH or do cpst if not improving on return

## 2018-03-14 ENCOUNTER — Other Ambulatory Visit: Payer: Self-pay | Admitting: Gastroenterology

## 2018-03-14 ENCOUNTER — Telehealth: Payer: Self-pay | Admitting: Nurse Practitioner

## 2018-03-14 DIAGNOSIS — R1084 Generalized abdominal pain: Secondary | ICD-10-CM

## 2018-03-14 NOTE — Telephone Encounter (Signed)
Phone call to patient to review instructions for 13 hr prep for CT w/ contrast on 03/17/18 at 0830. Prescription called into  [facility] on [address]. Pt aware and verbalized understanding of instructions. Prescription: 1930- 50mg  Prednisone 0130- 50mg  Prednisone 0730- 50mg  Prednisone and 50mg  Benadryl

## 2018-03-17 ENCOUNTER — Ambulatory Visit
Admission: RE | Admit: 2018-03-17 | Discharge: 2018-03-17 | Disposition: A | Discharge status: Home / Self Care | Payer: Medicare HMO | Source: Ambulatory Visit | Attending: Gastroenterology | Admitting: Gastroenterology

## 2018-03-17 DIAGNOSIS — R1084 Generalized abdominal pain: Secondary | ICD-10-CM

## 2018-03-17 MED ORDER — IOPAMIDOL (ISOVUE-300) INJECTION 61%
100.0000 mL | Freq: Once | INTRAVENOUS | Status: AC | PRN
  Administered 2018-03-17: 100 mL via INTRAVENOUS

## 2018-03-17 MED ORDER — DIPHENHYDRAMINE HCL 50 MG PO CAPS: 50.0000 mg | ORAL_CAPSULE | Freq: Once | ORAL | Status: DC

## 2018-03-17 MED ORDER — PREDNISONE 50 MG PO TABS: 50.0000 mg | ORAL_TABLET | Freq: Four times a day (QID) | ORAL | Status: DC

## 2018-03-17 MED ORDER — DIPHENHYDRAMINE HCL 50 MG/ML IJ SOLN: 50.0000 mg | Freq: Once | INTRAMUSCULAR | Status: DC

## 2018-03-20 ENCOUNTER — Encounter: Payer: Self-pay | Admitting: Gastroenterology

## 2018-03-28 ENCOUNTER — Ambulatory Visit: Payer: Medicare HMO | Admitting: Internal Medicine

## 2018-04-05 ENCOUNTER — Other Ambulatory Visit: Payer: Medicare HMO

## 2018-05-09 ENCOUNTER — Ambulatory Visit: Payer: Medicare HMO | Admitting: Internal Medicine

## 2018-05-17 ENCOUNTER — Other Ambulatory Visit: Payer: Medicare HMO

## 2018-07-20 ENCOUNTER — Other Ambulatory Visit: Payer: Medicare HMO

## 2018-08-16 ENCOUNTER — Other Ambulatory Visit: Payer: Self-pay | Admitting: Internal Medicine

## 2018-09-21 ENCOUNTER — Other Ambulatory Visit: Payer: Self-pay | Admitting: Internal Medicine

## 2018-09-21 DIAGNOSIS — G459 Transient cerebral ischemic attack, unspecified: Secondary | ICD-10-CM

## 2018-10-27 ENCOUNTER — Other Ambulatory Visit: Payer: Self-pay | Admitting: Internal Medicine

## 2018-10-27 DIAGNOSIS — Z1231 Encounter for screening mammogram for malignant neoplasm of breast: Secondary | ICD-10-CM

## 2018-12-14 ENCOUNTER — Ambulatory Visit
Admission: RE | Admit: 2018-12-14 | Discharge: 2018-12-14 | Disposition: A | Discharge status: Home / Self Care | Payer: Medicare HMO | Source: Ambulatory Visit | Attending: Internal Medicine | Admitting: Internal Medicine

## 2018-12-14 ENCOUNTER — Other Ambulatory Visit: Payer: Self-pay | Admitting: Internal Medicine

## 2018-12-14 ENCOUNTER — Other Ambulatory Visit: Payer: Self-pay

## 2018-12-14 DIAGNOSIS — Z1231 Encounter for screening mammogram for malignant neoplasm of breast: Secondary | ICD-10-CM

## 2018-12-26 ENCOUNTER — Other Ambulatory Visit: Payer: Self-pay

## 2018-12-26 DIAGNOSIS — I714 Abdominal aortic aneurysm, without rupture, unspecified: Secondary | ICD-10-CM

## 2018-12-26 DIAGNOSIS — I6529 Occlusion and stenosis of unspecified carotid artery: Secondary | ICD-10-CM

## 2018-12-26 NOTE — Addendum Note (Signed)
Addended by: York Cerise C on: 12/26/2018 03:37 PM   Modules accepted: Orders

## 2018-12-29 ENCOUNTER — Telehealth (HOSPITAL_COMMUNITY): Payer: Self-pay | Admitting: *Deleted

## 2018-12-29 NOTE — Telephone Encounter (Signed)

## 2019-01-01 ENCOUNTER — Ambulatory Visit (INDEPENDENT_AMBULATORY_CARE_PROVIDER_SITE_OTHER)
Admission: RE | Admit: 2019-01-01 | Discharge: 2019-01-01 | Disposition: A | Discharge status: Home / Self Care | Payer: Medicare HMO | Source: Ambulatory Visit | Attending: Family | Admitting: Family

## 2019-01-01 ENCOUNTER — Other Ambulatory Visit: Payer: Self-pay

## 2019-01-01 ENCOUNTER — Encounter: Payer: Self-pay | Admitting: Family

## 2019-01-01 ENCOUNTER — Ambulatory Visit (INDEPENDENT_AMBULATORY_CARE_PROVIDER_SITE_OTHER): Payer: Medicare HMO | Admitting: Family

## 2019-01-01 ENCOUNTER — Ambulatory Visit (HOSPITAL_COMMUNITY)
Admission: RE | Admit: 2019-01-01 | Discharge: 2019-01-01 | Disposition: A | Discharge status: Home / Self Care | Payer: Medicare HMO | Source: Ambulatory Visit | Attending: Family | Admitting: Family

## 2019-01-01 VITALS — BP 140/88 | HR 67 | Temp 97.5°F | Resp 14 | Ht 66.0 in | Wt 168.0 lb

## 2019-01-01 DIAGNOSIS — I714 Abdominal aortic aneurysm, without rupture, unspecified: Secondary | ICD-10-CM

## 2019-01-01 DIAGNOSIS — M792 Neuralgia and neuritis, unspecified: Secondary | ICD-10-CM

## 2019-01-01 DIAGNOSIS — I6523 Occlusion and stenosis of bilateral carotid arteries: Secondary | ICD-10-CM | POA: Diagnosis not present

## 2019-01-01 DIAGNOSIS — Z95828 Presence of other vascular implants and grafts: Secondary | ICD-10-CM

## 2019-01-01 DIAGNOSIS — I6529 Occlusion and stenosis of unspecified carotid artery: Secondary | ICD-10-CM | POA: Diagnosis not present

## 2019-01-01 DIAGNOSIS — M5410 Radiculopathy, site unspecified: Secondary | ICD-10-CM

## 2019-01-01 NOTE — Progress Notes (Signed)
[facility] [place]  CC: Follow up s/p Endovascular Repair of Abdominal Aortic Aneurysm    History of Present Illness  Kathryn [name] is a 73 y.o. (October 16, 1945) female who is s/p endovascular repair of a 5 cm aneurysm on 11/10/2017 by Dr. Trula Slade.  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  She was seen in the office on 11/17/2017 with left buttock pain and abdominal pain.   Dr. Trula Slade last evaluated pt on 12-19-17. At that time successful exclusion of her aneurysm noted on CTA.  Her left buttock claudication which was related to her hypogastric artery embolization had dramatically improved.  She was to follow-up in 6 months with a abdominal ultrasound.  Because of the urgency of her repair Dr. Trula Slade did not check preoperative carotid duplex, that is to be done in 6 months.  The patient is walking without significant limitations regarding her left buttock pain.   She states that she was evaluated for abdominal pain with what sounds like an EGD, states "irritated stomach" was found.  She started having low back pain about January 2020 with left leg feeling heavy, worse when supine and occasionaly with walking, has radiating pain from left low back to left ankle.  She has also had some tingling and tight feeling in her left hand since about January 2020. She denies any injury.  She states that she has never has a spine evaluation.  Diabetic: No Tobacco use: former smoker, quit in 2003   Past Medical History:  Diagnosis Date  . AAA (abdominal aortic aneurysm) ([place])   . Allergy   . Arthritis   . Asthma 2003  . Breast cancer ([place])    both breasts/lt. mastectomy  . Cancer ([place]) 2006   both breasts/cannot use rt. side  . Cataract    both eyes  . GERD (gastroesophageal reflux disease) 2003  . Heart murmur   . Hyperlipidemia    pt. stopped taking her medication for this /will attempt change in diet  . Hypertension 1978  . Migraine  headache 2008   Past Surgical History:  Procedure Laterality Date  . ABDOMINAL AORTIC ENDOVASCULAR STENT GRAFT N/A 11/10/2017   Procedure: ABDOMINAL AORTIC ENDOVASCULAR STENT GRAFT;  Surgeon: Serafina Mitchell, MD;  Location: [facility];  Service: Vascular;  Laterality: N/A;  . ABDOMINAL HYSTERECTOMY  1989  . BREAST LUMPECTOMY Left   . BREAST SURGERY    . CHOLECYSTECTOMY  1985  . COLONOSCOPY    . DILATION AND CURETTAGE OF UTERUS    . EMBOLIZATION Left 11/10/2017   Procedure: COILING EMBOLIZATION OF LEFT HYPOGASTRIC;  Surgeon: Serafina Mitchell, MD;  Location: [facility] OR;  Service: Vascular;  Laterality: Left;  . lipoma removed      back  . MASTECTOMY Right    malignant  . POLYPECTOMY    . ROTATOR CUFF REPAIR  2007   lt. shoulder  . TONSILLECTOMY    . TUBAL LIGATION     Social History Social History   Tobacco Use  . Smoking status: Former Smoker    Packs/day: 1.00    Years: 10.00    Pack years: 10.00    Types: Cigarettes    Quit date: 05/16/2001    Years since quitting: 17.6  . Smokeless tobacco: Current User    Types: Snuff, Chew  Substance Use Topics  . Alcohol use: No    Alcohol/week: 0.0 standard drinks  . Drug use: No   Family History Family History  Problem Relation Age of Onset  . Cancer Mother   . Heart failure Father   . Heart disease Father   . Kidney failure Brother   . Hypertension Brother   . Heart disease Sister   . Stroke Sister   . Stroke Sister   . Colon cancer Neg Hx   . Colon polyps Neg Hx   . Esophageal cancer Neg Hx   . Stomach cancer Neg Hx   . Rectal cancer Neg Hx    Current Outpatient Medications on File Prior to Visit  Medication Sig Dispense Refill  . aspirin EC 81 MG tablet Take 81 mg by mouth daily.    Marland Kitchen azelastine (ASTELIN) 0.1 % nasal spray Place 1 spray into both nostrils 2 (two) times daily as needed for allergies. Use in each nostril as directed    . dexlansoprazole (DEXILANT) 60 MG capsule Take 60 mg by mouth daily before breakfast.     . diltiazem (CARDIZEM CD) 360 MG 24 hr capsule Take 1 capsule (360 mg total) by mouth daily. 90 capsule 0  . famotidine (PEPCID) 20 MG tablet One at bedtime 30 tablet 11  . latanoprost (XALATAN) 0.005 % ophthalmic solution Place 1 drop into both eyes at bedtime.    Marland Kitchen levocetirizine (XYZAL) 5 MG tablet Take 5 mg by mouth every evening.    Marland Kitchen losartan (COZAAR) 25 MG tablet Take 25 mg by mouth daily.    . potassium chloride SA (K-DUR,KLOR-CON) 20 MEQ tablet Take 20 mEq by mouth every morning.     . vitamin C (ASCORBIC ACID) 500 MG tablet Take 500 mg by mouth daily.     No current facility-administered medications on file prior to visit.    Allergies  Allergen Reactions  . Ivp Dye [Iodinated Diagnostic Agents] Hives  . Sulfa Antibiotics Hives  . Adhesive [Tape] Rash  . Hydrocodone-Acetaminophen Itching  . Lisinopril Cough  . Lortab [Hydrocodone-Acetaminophen] Itching  . Milk-Related Compounds Other (See Comments)    Stomach pains, gas, bloating  . Percocet [Oxycodone-Acetaminophen] Itching    States only pain med she can take is tylenol or tylenol #3  . Sodium Hypochlorite Other (See Comments)    wheezing     ROS: See HPI for pertinent positives and negatives.  Physical Examination  Vitals:   01/01/19 0926  BP: 140/88  Pulse: 67  Resp: 14  Temp: (!) 97.5 F (36.4 C)  TempSrc: Temporal  SpO2: 98%  Weight: 168 lb (76.2 kg)  Height: 5\' 6"  (1.676 m)   Body mass index is 27.12 kg/m.  General: A&O x 3, WD, female in NAD HEENT: No gross abnormalities  Pulmonary: Sym exp, respirations are non labored, good air movement in all fields CTAB, no rales, rhonchi, or wheezes  Chest: s/p right mastectomy Cardiac: Regular rhythm and rate, no murmur appreciated  Vascular: Vessel Right Left  Radial 2+Palpable 2+Palpable  Carotid  without bruit  without bruit  Aorta Not palpable N/A  Femoral 2+Palpable 2+Palpable  Popliteal 2+ palpable 2+t palpable  PT 2+Palpable 2+Palpable  DP  2+Palpable 2+Palpable   Gastrointestinal: soft, NTND, -G/R, - HSM, - palpable masses, - CVAT B. Musculoskeletal: M/S 5/5 throughout except 4/5 in left LE, extremities without ischemic changes  Skin: No rashes, no ulcers, no cellulitis.   Neurologic: Pain and light touch intact in extremities; pain elicited in left low back, left buttock and left thigh with left stratight leg raise against resistance, no pain in the right LE with straight leg raise against  resistance Motor exam as listed above. Psychiatric: Normal thought content, mood appropriate for clinical situation.     DATA  Carotid Duplex (01-01-19): Right Carotid: Velocities in the right ICA are consistent with a 1-39% stenosis. Left Carotid: Velocities in the left ICA are consistent with a 1-39% stenosis. Vertebrals:  Bilateral vertebral arteries demonstrate antegrade flow. Subclavians: Normal flow hemodynamics were seen in bilateral subclavian arteries.               Note: The carotid bifurcations are high in the neck.   EVAR Duplex   Current (Date: 01-01-19): Endovascular Aortic Repair (EVAR): +----------+----------------+-------------------+-------------------+           Diameter AP (cm)Diameter Trans (cm)Velocities (cm/sec) +----------+----------------+-------------------+-------------------+ Aorta     4.40            4.48               49                  +----------+----------------+-------------------+-------------------+ Right Limb2.11            2.10               28                  +----------+----------------+-------------------+-------------------+ Left Limb 1.40            1.29               95                  +----------+----------------+-------------------+-------------------+  Summary: Abdominal Aorta: Previous diameter measurement was 4.7 cm obtained on 07/14/17. The aortic sac measures 4.40 x 4.48 cms post-op.    CTA Abd/Pelvis Duplex (Date: 12-12-17) 1. Successful endovascular  aortic repair of both the infrarenal abdominal aortic aneurysm and the left internal iliac artery aneurysm. The excluded aneurysm sacs have both slightly decreased in size and demonstrate no evidence of endoleak. Aortic aneurysm now measures a maximum of 4.7 cm while the left internal iliac artery aneurysm measures up to 2.2 cm. Aortic aneurysm NOS (ICD10-I71.9); Aortic Atherosclerosis (ICD10-170.0). 2. No evidence of access site complication.  NON-VASCULAR  Ancillary findings as above without significant interval change compared to relatively recent prior imaging.    Medical Decision Making   Kathryn [name] is a 73 y.o. female who is s/p EVAR with GORE 23x14x18, R 23x12, R23x10, and Coil embolization of left hypogastric aneurysm with Inter[place]lk: 12x20, 10x40, 15x40 on 11-10-17 by Dr. Trula Slade for AAA.  Abdominal duplex today shows decrease in sac size from 4.7 cm on 12-12-17 CTA abd/pelvis, to 4.5 cm today.  Carotid duplex shows 1-39% bilateral ICA stenosis.   I discussed with the patient the importance of surveillance of the endograft.  The next endograft duplex will be scheduled for 12 months.  The patient will follow up with [place] in 12 months with these studies.  Carotid duplex in 2-3-years   Referral to Dr. Erline Levine for evaluation of cervical and lumbar spine left side radicular symptoms. Pt started having low back pain about January 2020 with left leg feeling heavy, worse when supine and occasionaly with walking, has radiating pain from left low back to left ankle.  She has also had some tingling and tight feeling in her left hand since about January 2020. She denies any injury.  She states that she has never has a spine evaluation. Her bilateral pedal pulses are 2+ palpable.    I emphasized the importance of maximal  medical management including strict control of blood pressure, blood glucose, and lipid levels, antiplatelet agents, obtaining regular exercise, and  cessation of smoking.   Thank you for allowing [place] to participate in this patient's care.  Clemon Chambers, RN, MSN, FNP-C [facility] Office: [facility] Physician: Trula Slade  01/01/2019, 9:42 AM

## 2019-01-02 ENCOUNTER — Other Ambulatory Visit: Payer: Self-pay

## 2019-01-02 DIAGNOSIS — I714 Abdominal aortic aneurysm, without rupture, unspecified: Secondary | ICD-10-CM

## 2021-08-12 IMAGING — MG DIGITAL SCREENING UNILAT LEFT W/ TOMO W/ CAD
4 series · 4 of 12 positions shown · non-contrast
Comparison: Previous exam(s).

CLINICAL DATA: Screening.

EXAM:
DIGITAL SCREENING UNILATERAL LEFT MAMMOGRAM WITH CAD AND TOMO

[L CC synth-2D]
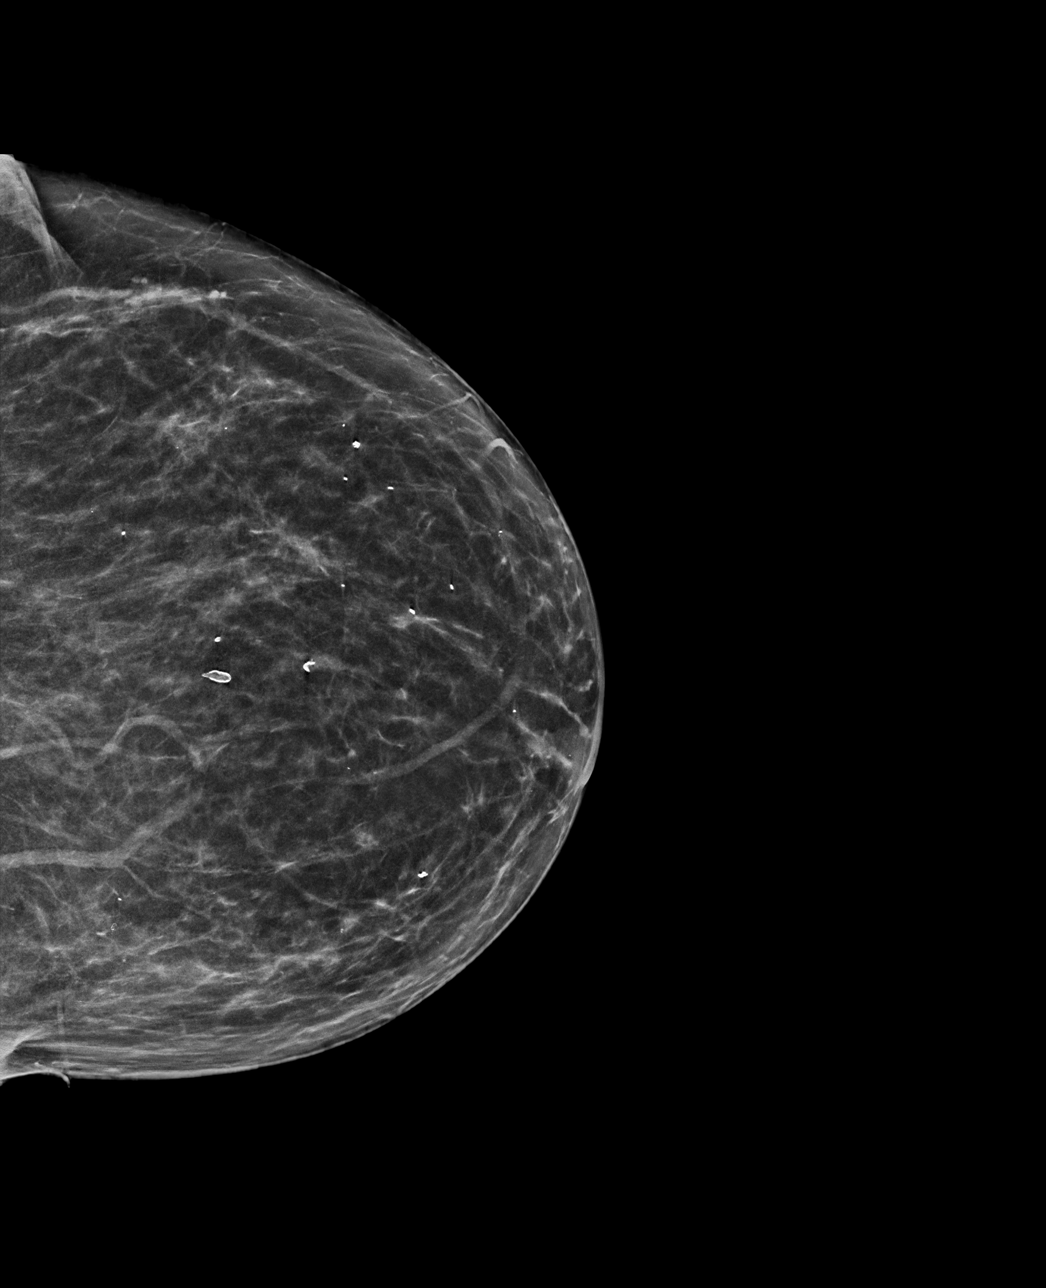

[L MLO synth-2D]
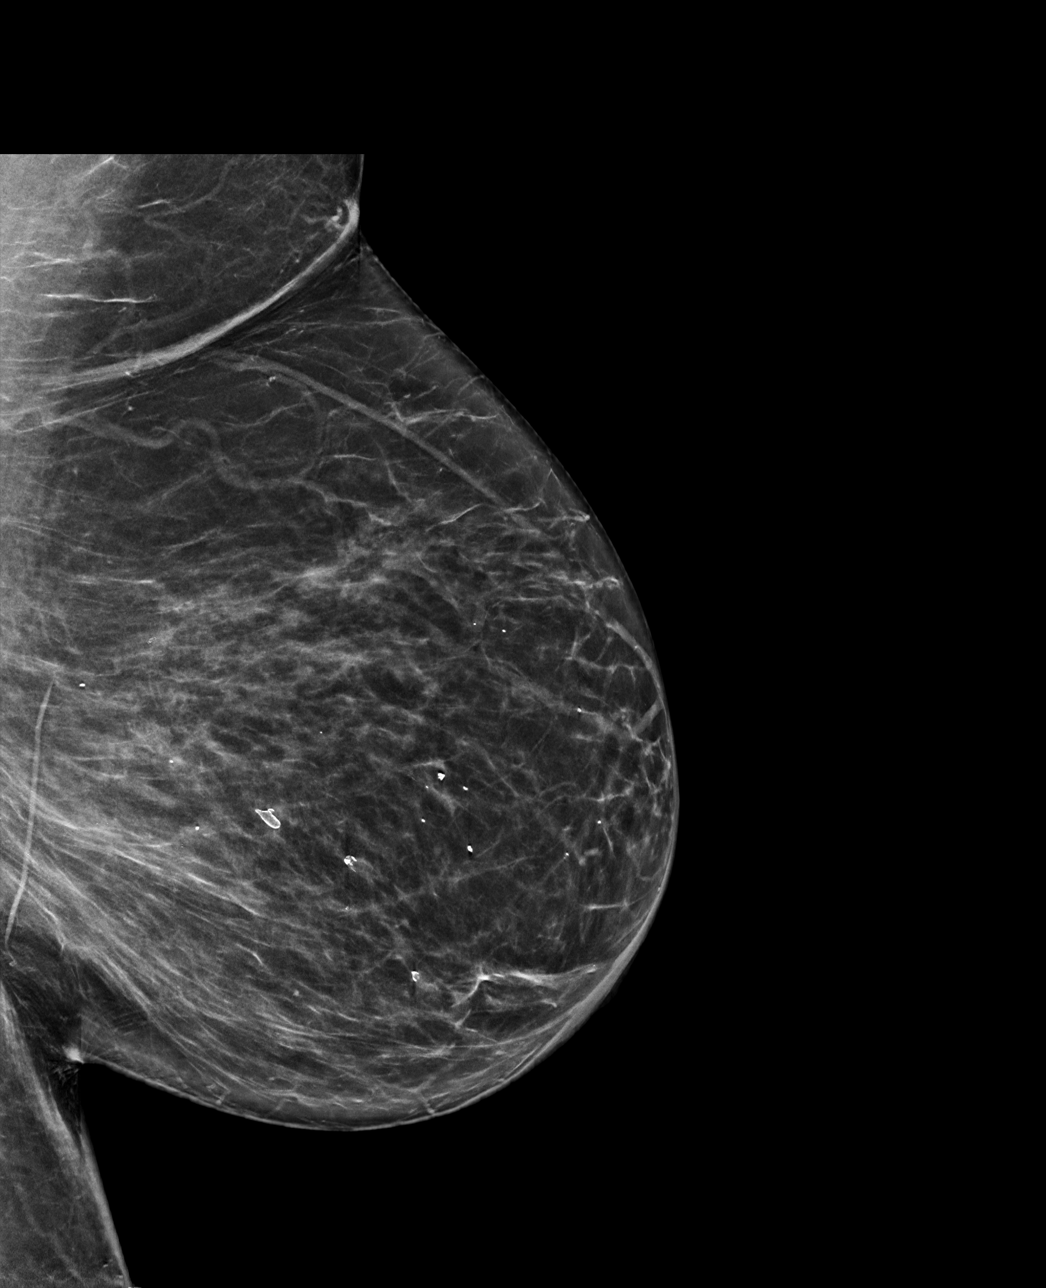

[L MLO tomo · tomo slice 36/71.0]
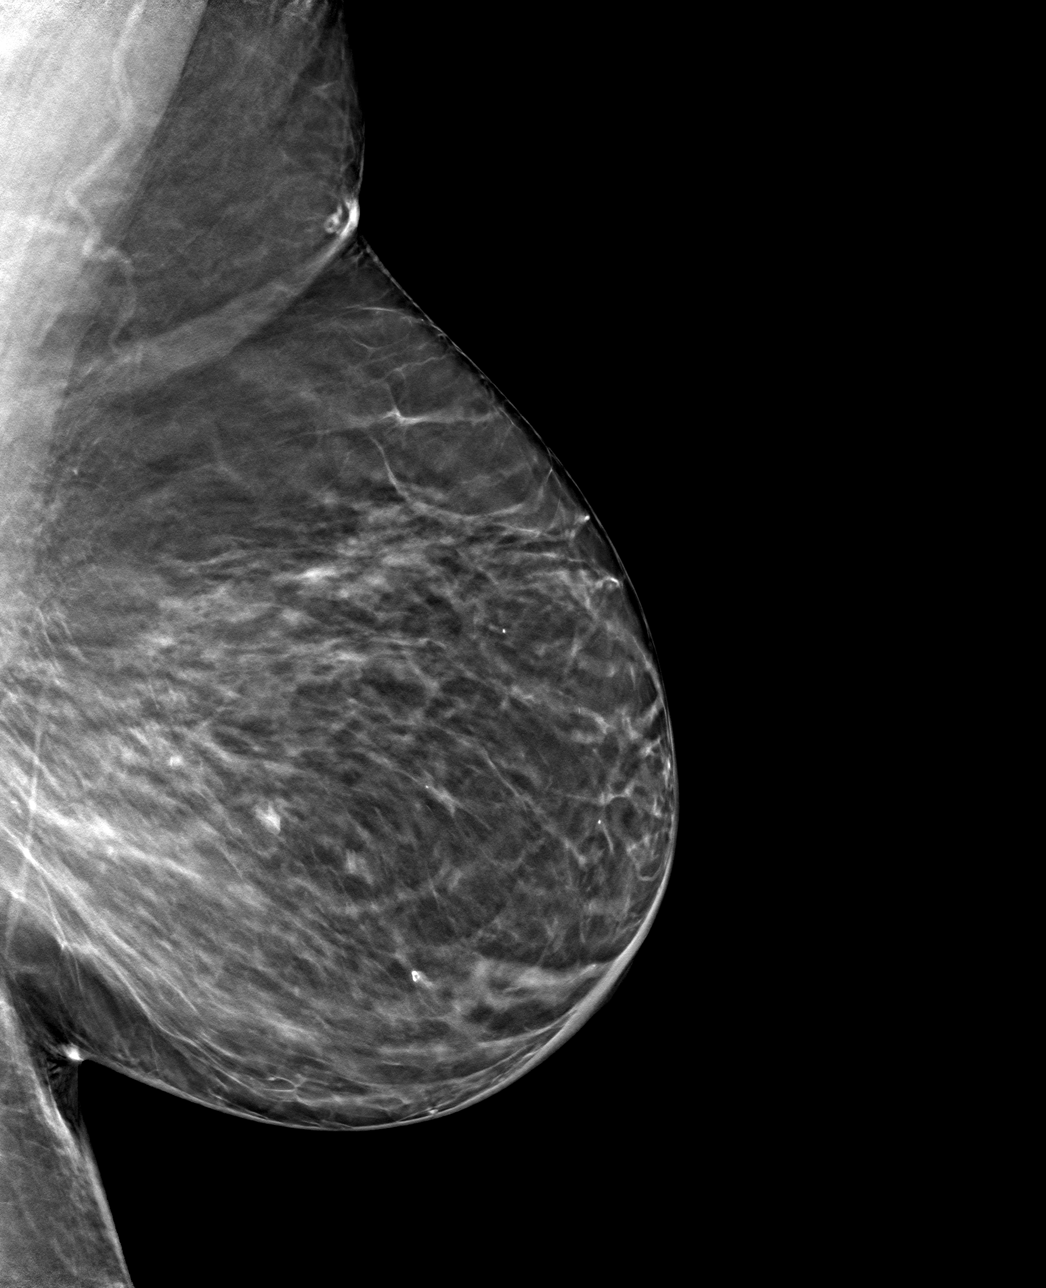

[L CC tomo · tomo slice 32/63.0]
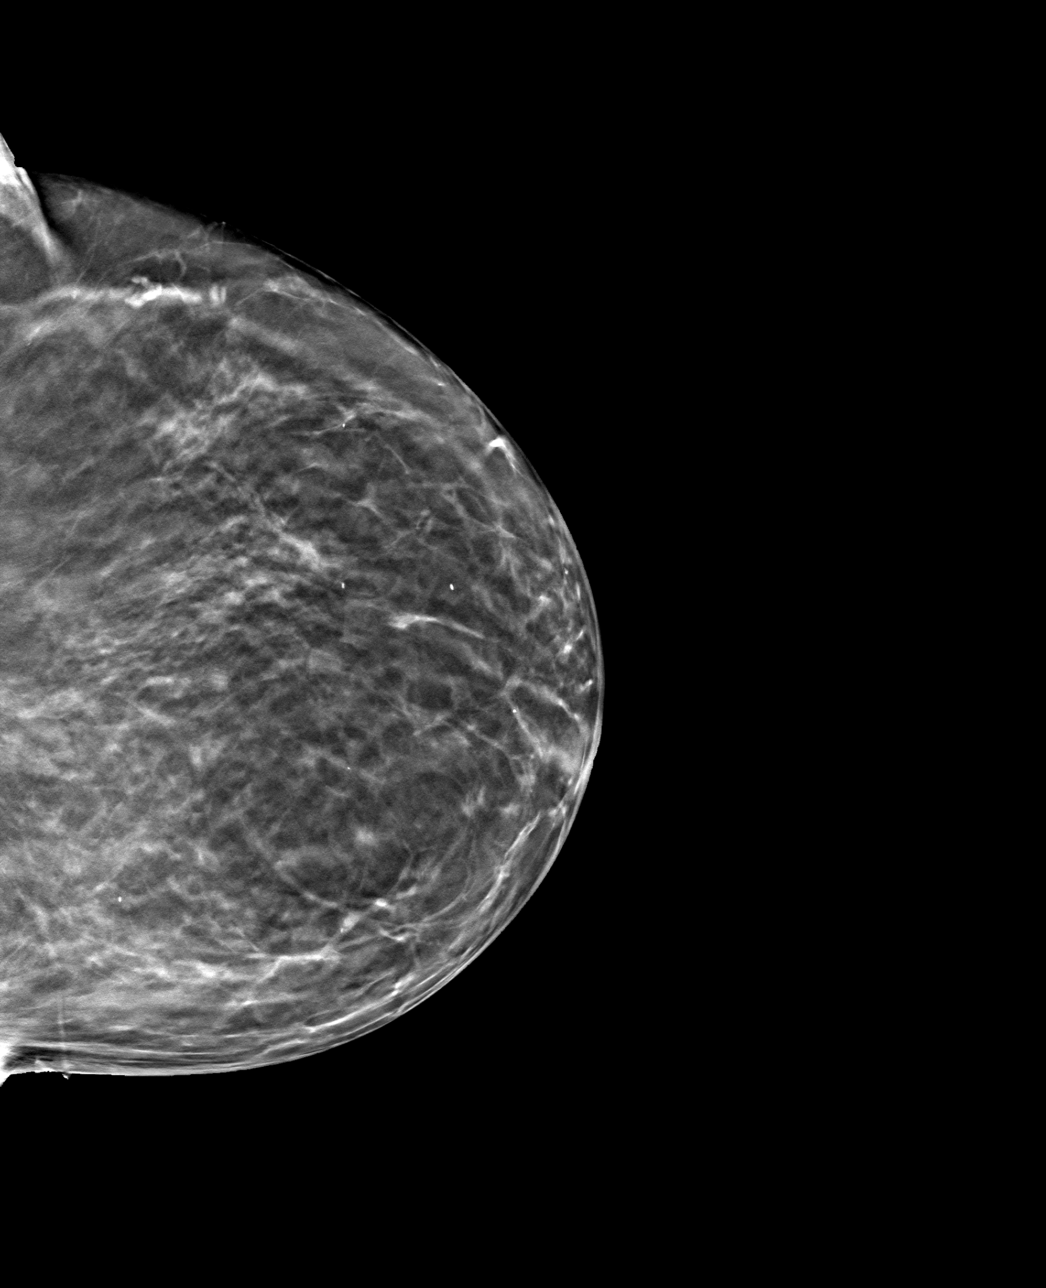

[4 of 12 positions shown; findings below may reference images not displayed]

ACR Breast Density Category b: There are scattered areas of
fibroglandular density.
FINDINGS: There are no findings suspicious for malignancy. Images were
processed with CAD.
IMPRESSION: No mammographic evidence of malignancy. A result letter of this
screening mammogram will be mailed directly to the patient.

RECOMMENDATION:
Screening mammogram in one year. (Code:[id number])

BI-RADS CATEGORY  1: Negative.

## 2021-11-11 ENCOUNTER — Encounter: Payer: Self-pay | Admitting: Gastroenterology
